# Patient Record
Sex: Male | Born: 1945 | Race: White | Hispanic: No | Marital: Married | Smoking: Former smoker
Health system: Southern US, Community
[De-identification: ages and names within clinical notes are randomized; demographics above are authoritative.]

## PROBLEM LIST (undated history)

## (undated) DIAGNOSIS — I82409 Acute embolism and thrombosis of unspecified deep veins of unspecified lower extremity: Secondary | ICD-10-CM

## (undated) DIAGNOSIS — G43909 Migraine, unspecified, not intractable, without status migrainosus: Secondary | ICD-10-CM

## (undated) DIAGNOSIS — I517 Cardiomegaly: Secondary | ICD-10-CM

## (undated) DIAGNOSIS — M1990 Unspecified osteoarthritis, unspecified site: Secondary | ICD-10-CM

## (undated) DIAGNOSIS — E669 Obesity, unspecified: Secondary | ICD-10-CM

## (undated) DIAGNOSIS — N400 Enlarged prostate without lower urinary tract symptoms: Secondary | ICD-10-CM

## (undated) DIAGNOSIS — E079 Disorder of thyroid, unspecified: Secondary | ICD-10-CM

## (undated) DIAGNOSIS — L0390 Cellulitis, unspecified: Secondary | ICD-10-CM

## (undated) DIAGNOSIS — I639 Cerebral infarction, unspecified: Secondary | ICD-10-CM

## (undated) DIAGNOSIS — G459 Transient cerebral ischemic attack, unspecified: Secondary | ICD-10-CM

## (undated) DIAGNOSIS — I10 Essential (primary) hypertension: Secondary | ICD-10-CM

## (undated) DIAGNOSIS — E785 Hyperlipidemia, unspecified: Secondary | ICD-10-CM

## (undated) DIAGNOSIS — I5189 Other ill-defined heart diseases: Secondary | ICD-10-CM

## (undated) HISTORY — PX: HERNIA REPAIR: SHX51

## (undated) HISTORY — DX: Unspecified osteoarthritis, unspecified site: M19.90

## (undated) HISTORY — DX: Hyperlipidemia, unspecified: E78.5

## (undated) HISTORY — DX: Other ill-defined heart diseases: I51.89

## (undated) HISTORY — DX: Obesity, unspecified: E66.9

## (undated) HISTORY — DX: Essential (primary) hypertension: I10

## (undated) HISTORY — DX: Migraine, unspecified, not intractable, without status migrainosus: G43.909

## (undated) HISTORY — DX: Cardiomegaly: I51.7

## (undated) HISTORY — PX: TONSILLECTOMY: SUR1361

## (undated) HISTORY — PX: HAND SURGERY: SHX662

## (undated) HISTORY — DX: Acute embolism and thrombosis of unspecified deep veins of unspecified lower extremity: I82.409

## (undated) HISTORY — DX: Cerebral infarction, unspecified: I63.9

## (undated) HISTORY — DX: Cellulitis, unspecified: L03.90

## (undated) HISTORY — DX: Transient cerebral ischemic attack, unspecified: G45.9

## (undated) HISTORY — DX: Disorder of thyroid, unspecified: E07.9

---

## 1997-09-28 ENCOUNTER — Other Ambulatory Visit: Admission: RE | Admit: 1997-09-28 | Discharge: 1997-09-28 | Payer: Self-pay | Admitting: Gastroenterology

## 2002-01-24 ENCOUNTER — Encounter: Payer: Self-pay | Admitting: Family Medicine

## 2002-01-24 ENCOUNTER — Inpatient Hospital Stay (HOSPITAL_COMMUNITY): Admission: AD | Admit: 2002-01-24 | Discharge: 2002-01-27 | Payer: Self-pay | Admitting: Family Medicine

## 2002-01-26 ENCOUNTER — Encounter: Payer: Self-pay | Admitting: Family Medicine

## 2007-06-14 ENCOUNTER — Encounter: Admission: RE | Admit: 2007-06-14 | Discharge: 2007-06-14 | Payer: Self-pay | Admitting: Internal Medicine

## 2007-07-15 ENCOUNTER — Encounter: Admission: RE | Admit: 2007-07-15 | Discharge: 2007-07-15 | Payer: Self-pay | Admitting: Internal Medicine

## 2007-07-18 ENCOUNTER — Encounter: Admission: RE | Admit: 2007-07-18 | Discharge: 2007-07-18 | Payer: Self-pay | Admitting: Internal Medicine

## 2008-12-19 ENCOUNTER — Observation Stay (HOSPITAL_COMMUNITY): Admission: EM | Admit: 2008-12-19 | Discharge: 2008-12-20 | Payer: Self-pay | Admitting: Emergency Medicine

## 2008-12-19 ENCOUNTER — Encounter (INDEPENDENT_AMBULATORY_CARE_PROVIDER_SITE_OTHER): Payer: Self-pay | Admitting: Internal Medicine

## 2008-12-21 ENCOUNTER — Encounter: Admission: RE | Admit: 2008-12-21 | Discharge: 2008-12-21 | Payer: Self-pay | Admitting: Internal Medicine

## 2008-12-22 ENCOUNTER — Ambulatory Visit: Payer: Self-pay | Admitting: Vascular Surgery

## 2008-12-29 ENCOUNTER — Encounter (HOSPITAL_COMMUNITY): Admission: RE | Admit: 2008-12-29 | Discharge: 2009-01-29 | Payer: Self-pay | Admitting: Cardiology

## 2009-08-17 ENCOUNTER — Encounter (INDEPENDENT_AMBULATORY_CARE_PROVIDER_SITE_OTHER): Payer: Self-pay | Admitting: *Deleted

## 2009-08-18 ENCOUNTER — Encounter (INDEPENDENT_AMBULATORY_CARE_PROVIDER_SITE_OTHER): Payer: Self-pay | Admitting: *Deleted

## 2009-08-19 ENCOUNTER — Ambulatory Visit: Payer: Self-pay | Admitting: Gastroenterology

## 2009-09-02 ENCOUNTER — Ambulatory Visit: Payer: Self-pay | Admitting: Gastroenterology

## 2010-01-19 ENCOUNTER — Ambulatory Visit: Payer: Self-pay | Admitting: Vascular Surgery

## 2010-02-11 ENCOUNTER — Telehealth: Payer: Self-pay | Admitting: Gastroenterology

## 2010-03-01 NOTE — Miscellaneous (Signed)
Summary: [facility] previsit  Clinical Lists Changes  Medications: Added new medication of MOVIPREP 100 GM  SOLR (PEG-KCL-NACL-NASULF-NA ASC-C) As per prep instructions. - Signed Rx of MOVIPREP 100 GM  SOLR (PEG-KCL-NACL-NASULF-NA ASC-C) As per prep instructions.;  #1 x 0;  Signed;  Entered by: Randall Hiss RN;  Authorized by: Inda Castle MD;  Method used: Electronically to [facility]*, [address], [place], [place]  [id number], Ph: [phone], Fax: [phone] Allergies: Added new allergy or adverse reaction of * ANYTHING OF A BOVINE NATURE Observations: Added new observation of NKA: F (08/19/2009 15:15)    Prescriptions: MOVIPREP 100 GM  SOLR (PEG-KCL-NACL-NASULF-NA ASC-C) As per prep instructions.  #1 x 0   Entered by:   Randall Hiss RN   Authorized by:   Inda Castle MD   Signed by:   Randall Hiss RN on 08/19/2009   Method used:   Electronically to        [facility] (retail)       [place], [place]  [id number]       Ph: [phone]       Fax: [phone]   RxID:   [id number]

## 2010-03-01 NOTE — Letter (Signed)
Summary: Previsit letter  [facility]  [place], [place] [address]   Phone: [phone]  Fax: [phone]       08/17/2009 MRN: [id number]  Casey Parker [place], [place]  [address]  Dear Mr. Besser,  Welcome to the Gastroenterology Division at [facility].    You are scheduled to see a nurse for your pre-procedure visit on 08/19/2009 at 3:30pm on the 3rd floor at [facility], [place] [employer].  We ask that you try to arrive at our office 15 minutes prior to your appointment time to allow for check-in.  Your nurse visit will consist of discussing your medical and surgical history, your immediate family medical history, and your medications.    Please bring a complete list of all your medications or, if you prefer, bring the medication bottles and we will list them.  We will need to be aware of both prescribed and over the counter drugs.  We will need to know exact dosage information as well.  If you are on blood thinners (Coumadin, Plavix, Aggrenox, Ticlid, etc.) please call our office today/prior to your appointment, as we need to consult with your physician about holding your medication.   Please be prepared to read and sign documents such as consent forms, a financial agreement, and acknowledgement forms.  If necessary, and with your consent, a friend or relative is welcome to sit-in on the nurse visit with you.  Please bring your insurance card so that we may make a copy of it.  If your insurance requires a referral to see a specialist, please bring your referral form from your primary care physician.  No co-pay is required for this nurse visit.     If you cannot keep your appointment, please call [phone] to cancel or reschedule prior to your appointment date.  This allows [place] the opportunity to schedule an appointment for another patient in need of care.    Thank you for choosing [facility] for your medical needs.  We  appreciate the opportunity to care for you.  Please visit [place] at our website  to learn more about our practice.                     Sincerely.                                                                                                                   The Gastroenterology Division

## 2010-03-01 NOTE — Letter (Signed)
Summary: [facility] Instructions  [facility]  [place], [place] [address]   Phone: [phone]  Fax: [phone]       Casey Parker    [birth year]    MRN: [id number]        Procedure Day /Date: Thursday 09/02/2009     Arrival Time:  1:00 pm      Procedure Time: 2:00 pm     Location of Procedure:                    _x _  [place] (4th Floor)                        [place]   Starting 5 days prior to your procedure Saturday 7/30 do not eat nuts, seeds, popcorn, corn, beans, peas,  salads, or any raw vegetables.  Do not take any fiber supplements (e.g. Metamucil, Citrucel, and Benefiber).  THE DAY BEFORE YOUR PROCEDURE         DATE: Wednesday 8/3  1.  Drink clear liquids the entire day-NO SOLID FOOD  2.  Do not drink anything colored red or purple.  Avoid juices with pulp.  No orange juice.  3.  Drink at least 64 oz. (8 glasses) of fluid/clear liquids during the day to prevent dehydration and help the prep work efficiently.  CLEAR LIQUIDS INCLUDE: Water Jello Ice Popsicles Tea (sugar ok, no milk/cream) Powdered fruit flavored drinks Coffee (sugar ok, no milk/cream) Gatorade Juice: apple, white grape, white cranberry  Lemonade Clear bullion, consomm, broth Carbonated beverages (any kind) Strained chicken noodle soup Hard Candy                             4.  In the morning, mix first dose of MoviPrep solution:    Empty 1 Pouch A and 1 Pouch B into the disposable container    Add lukewarm drinking water to the top line of the container. Mix to dissolve    Refrigerate (mixed solution should be used within 24 hrs)  5.  Begin drinking the prep at 5:00 p.m. The MoviPrep container is divided by 4 marks.   Every 15 minutes drink the solution down to the next mark (approximately 8 oz) until the full liter is complete.   6.  Follow completed prep with 16 oz of clear liquid of your choice (Nothing  red or purple).  Continue to drink clear liquids until bedtime.  7.  Before going to bed, mix second dose of MoviPrep solution:    Empty 1 Pouch A and 1 Pouch B into the disposable container    Add lukewarm drinking water to the top line of the container. Mix to dissolve    Refrigerate  THE DAY OF YOUR PROCEDURE      DATE: Thursday 8/4  Beginning at 9:00 a.m. (5 hours before procedure):         1. Every 15 minutes, drink the solution down to the next mark (approx 8 oz) until the full liter is complete.  2. Follow completed prep with 16 oz. of clear liquid of your choice.    3. You may drink clear liquids until 12:00 pm (2 HOURS BEFORE PROCEDURE).   MEDICATION INSTRUCTIONS  Unless otherwise instructed, you should take regular prescription medications with a small sip of water   as early as possible the morning of  your procedure.        OTHER INSTRUCTIONS  You will need a responsible adult at least 65 years of age to accompany you and drive you home.   This person must remain in the waiting room during your procedure.  Wear loose fitting clothing that is easily removed.  Leave jewelry and other valuables at home.  However, you may wish to bring a book to read or  an iPod/MP3 player to listen to music as you wait for your procedure to start.  Remove all body piercing jewelry and leave at home.  Total time from sign-in until discharge is approximately 2-3 hours.  You should go home directly after your procedure and rest.  You can resume normal activities the  day after your procedure.  The day of your procedure you should not:   Drive   Make legal decisions   Operate machinery   Drink alcohol   Return to work  You will receive specific instructions about eating, activities and medications before you leave.    The above instructions have been reviewed and explained to me by   Randall Hiss RN  August 19, 2009 3:49 PM    I fully understand and can  verbalize these instructions _____________________________ Date _________

## 2010-03-01 NOTE — Procedures (Signed)
Summary: Colonoscopy  Patient: [name] Note: All result statuses are Final unless otherwise noted.  Tests: (1) Colonoscopy (COL)   COL Colonoscopy           [facility]     [place], [place]  [address]           OPERATIVE PROCEDURE REPORT           PATIENT:  Casey Parker, Casey Parker  MR#:  [id number]     BIRTHDATE:  March 03, 1945, 64 yrs. old  GENDER:  male           ENDOSCOPIST:  Sandy Salaam. Deatra Ina, MD     ASSISTANT:           PROCEDURE DATE:  09/02/2009     PROCEDURE:  Diagnostic Colonoscopy     ASA CLASS:  Class II     INDICATIONS:  screening, history of pre-cancerous (adenomatous)     colon polyps Last colonoscopy 2007           MEDICATIONS:   Fentanyl 75 mcg IV, Versed 9 mg IV           DESCRIPTION OF PROCEDURE:   After the risks benefits and     alternatives of the procedure were thoroughly explained, informed     consent was obtained.  Digital rectal exam was performed and     revealed no abnormalities.   The LB CF-[id number] endoscope     was introduced through the anus and advanced to the cecum, which     was identified by both the appendix and ileocecal valve, without     limitations.  The quality of the prep was excellent, using     MoviPrep.  The instrument was then slowly withdrawn as the colon     was fully examined.  Time to cecum: 5.25.  Withdraw time 6.0           <<PROCEDUREIMAGES>>           FINDINGS:  A normal appearing cecum, ileocecal valve, and     appendiceal orifice were identified. The ascending, hepatic     flexure, transverse, splenic flexure, descending, sigmoid colon,     and rectum appeared unremarkable (see image2, image3, image4,     image6, image7, image8, image10, image12, and image13).     Retroflexed views in the rectum revealed no abnormalities.    The     scope was then withdrawn from the patient and the procedure     terminated.           COMPLICATIONS:  None           ENDOSCOPIC IMPRESSION:     1)  Normal colon     RECOMMENDATIONS:     1) Colonoscopy in 7 years           REPEAT EXAM:  In 7 year(s) for.           ______________________________     Sandy Salaam. Deatra Ina, MD           CC:  Casey Parker, M.D.           n.     eSIGNED:   Sandy Salaam. Clarice Bonaventure at 09/02/2009 02:05 PM           Konrad Penta, [id number]  Note: An exclamation mark (!) indicates a result that was not dispersed into the flowsheet. Document Creation Date: 09/02/2009 2:07 PM _______________________________________________________________________  Marland Kitchen  1) Order result status: Final Collection or observation date-time: 09/02/2009 14:01 Requested date-time:  Receipt date-time:  Reported date-time:  Referring Physician:   Ordering Physician: Erskine Emery [phone]) Specimen Source:  Source: Casey Parker Order Number: [id number] Lab site:   Appended Document: Colonoscopy    Clinical Lists Changes  Observations: Added new observation of COLONNXTDUE: 08/2016 (09/02/2009 15:56)

## 2010-03-03 NOTE — Progress Notes (Signed)
Summary: Procedure coding  Phone Note Call from Patient Call back at [phone]   Caller: Patient Call For: Dr. Deatra Ina Reason for Call: Talk to Nurse Summary of Call: Pt is calling because he is saying that we coded his procedure wrong and that we need to recode it to make it preventative,  Casey Parker From  [employer] ([phone]) wants to speak with someone about this matter and the coding guidelines from [facility], they have already spoke to the billing office which reffered them to Dr. Deatra Ina directly because that is who has to make the decision to change the coding the patient has given verbal permission for [place] to speak with Casey about this issue Initial call taken by: Martinique Johnson,  February 11, 2010 4:35 PM  Follow-up for Phone Call        We need to code the procedure: V76.51, V12.72 Follow-up by: Inda Castle MD,  February 14, 2010 4:16 PM  Additional Follow-up for Phone Call Additional follow up Details #1::        Information sent to [employer] with [facility] Billing.  Additional Follow-up by: Cora Daniels,  February 15, 2010 7:21 AM

## 2010-05-04 LAB — POCT CARDIAC MARKERS
CKMB, poc: 2 ng/mL (ref 1.0–8.0)
Myoglobin, poc: 73.4 ng/mL (ref 12–200)
Myoglobin, poc: 95.2 ng/mL (ref 12–200)
Troponin i, poc: <0.05 ng/mL (ref 0.00–0.09)

## 2010-05-04 LAB — COMPREHENSIVE METABOLIC PANEL
AST: 18 U/L (ref 0–37)
CO2: 29 mEq/L (ref 19–32)
Calcium: 8.9 mg/dL (ref 8.4–10.5)
Creatinine, Ser: 0.88 mg/dL (ref 0.4–1.5)
GFR calc Af Amer: >60 mL/min (ref >60)
GFR calc non Af Amer: >60 mL/min (ref >60)
Total Protein: 6.3 g/dL (ref 6.0–8.3)

## 2010-05-04 LAB — URINALYSIS, ROUTINE W REFLEX MICROSCOPIC
Bilirubin Urine: NEGATIVE
Glucose, UA: NEGATIVE mg/dL
Hgb urine dipstick: NEGATIVE
Ketones, ur: NEGATIVE mg/dL
Protein, ur: NEGATIVE mg/dL
pH: 6 (ref 5.0–8.0)

## 2010-05-04 LAB — DIFFERENTIAL
Basophils Absolute: 0 10*3/uL (ref 0.0–0.1)
Basophils Relative: 0 % (ref 0–1)
Eosinophils Relative: 1 % (ref 0–5)
Monocytes Absolute: 0.6 10*3/uL (ref 0.1–1.0)

## 2010-05-04 LAB — CBC
HCT: 36.9 % — ABNORMAL LOW (ref 39.0–52.0)
Hemoglobin: 12.8 g/dL — ABNORMAL LOW (ref 13.0–17.0)
MCHC: 34.5 g/dL (ref 30.0–36.0)
MCHC: 34.7 g/dL (ref 30.0–36.0)
MCV: 95.1 fL (ref 78.0–100.0)
Platelets: 183 10*3/uL (ref 150–400)
RBC: 3.98 MIL/uL — ABNORMAL LOW (ref 4.22–5.81)
RDW: 13.2 % (ref 11.5–15.5)
RDW: 13.4 % (ref 11.5–15.5)

## 2010-05-04 LAB — BASIC METABOLIC PANEL
BUN: 17 mg/dL (ref 6–23)
CO2: 28 mEq/L (ref 19–32)
Calcium: 8.6 mg/dL (ref 8.4–10.5)
Glucose, Bld: 115 mg/dL — ABNORMAL HIGH (ref 70–99)
Potassium: 3.8 mEq/L (ref 3.5–5.1)
Sodium: 137 mEq/L (ref 135–145)

## 2010-05-04 LAB — LIPID PANEL
Cholesterol: 243 mg/dL — ABNORMAL HIGH (ref 0–200)
LDL Cholesterol: 177 mg/dL — ABNORMAL HIGH (ref 0–99)
Total CHOL/HDL Ratio: 5.9 RATIO

## 2010-05-04 LAB — T4, FREE: Free T4: 1.23 ng/dL (ref 0.80–1.80)

## 2010-05-04 LAB — PROTIME-INR: Prothrombin Time: 12.5 seconds (ref 11.6–15.2)

## 2010-06-14 NOTE — Procedures (Signed)
CAROTID DUPLEX EXAM   INDICATION:  History of TIAs.   HISTORY:  Diabetes:  No.  Cardiac:  No.  Hypertension:  Yes.  Smoking:  Previous.  Previous Surgery:  No.  CV History:  TIA.  Amaurosis Fugax No, Paresthesias No, Hemiparesis No                                       RIGHT             LEFT  Brachial systolic pressure:         125               122  Brachial Doppler waveforms:         Triphasic         Triphasic  Vertebral direction of flow:        Antegrade         Antegrade  DUPLEX VELOCITIES (cm/sec)  CCA peak systolic                   66                72  ECA peak systolic                   87                95  ICA peak systolic                   111               79  ICA end diastolic                   50                29  PLAQUE MORPHOLOGY:                  Calcified         Calcified  PLAQUE AMOUNT:                      Moderate          Mild  PLAQUE LOCATION:                    Bulb and ICA      Bulb and ECA   IMPRESSION:  40%-59% stenosis noted in the right internal carotid  artery.   ___________________________________________  Judeth Cornfield. Scot Dock, M.D.   CJ/MEDQ  D:  12/23/2008  T:  12/23/2008  Job:  [id number]

## 2010-06-14 NOTE — Procedures (Signed)
CAROTID DUPLEX EXAM   INDICATION:  Follow up carotid artery disease.   HISTORY:  Diabetes:  No.  Cardiac:  No.  Hypertension:  Yes.  Smoking:  Previous.  Previous Surgery:  No.  CV History:  Patient is currently asymptomatic.  Amaurosis Fugax No, Paresthesias No, Hemiparesis No.                                       RIGHT             LEFT  Brachial systolic pressure:         124               128  Brachial Doppler waveforms:         WNL               WNL  Vertebral direction of flow:        Antegrade         Antegrade  DUPLEX VELOCITIES (cm/sec)  CCA peak systolic                   53                73  ECA peak systolic                   65                52  ICA peak systolic                   70                54  ICA end diastolic                   30                21  PLAQUE MORPHOLOGY:                  Heterogenous      Heterogenous  PLAQUE AMOUNT:                      Mild              Mild  PLAQUE LOCATION:                    ICA, ECA          ICA, ECA   IMPRESSION:  1. Bilaterally no hemodynamically significant stenosis.  2. Bilateral intimal thickening within the common carotid arteries.  3. Study is stable compared to previous.   ___________________________________________  Judeth Cornfield. Scot Dock, M.D.   OD/MEDQ  D:  02/14/2010  T:  02/14/2010  Job:  [id number]
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
## 2011-04-05 DIAGNOSIS — Z125 Encounter for screening for malignant neoplasm of prostate: Secondary | ICD-10-CM | POA: Diagnosis not present

## 2011-04-05 DIAGNOSIS — E039 Hypothyroidism, unspecified: Secondary | ICD-10-CM | POA: Diagnosis not present

## 2011-04-05 DIAGNOSIS — I10 Essential (primary) hypertension: Secondary | ICD-10-CM | POA: Diagnosis not present

## 2011-04-05 DIAGNOSIS — E785 Hyperlipidemia, unspecified: Secondary | ICD-10-CM | POA: Diagnosis not present

## 2011-04-12 DIAGNOSIS — Z125 Encounter for screening for malignant neoplasm of prostate: Secondary | ICD-10-CM | POA: Diagnosis not present

## 2011-04-12 DIAGNOSIS — I6350 Cerebral infarction due to unspecified occlusion or stenosis of unspecified cerebral artery: Secondary | ICD-10-CM | POA: Diagnosis not present

## 2011-04-12 DIAGNOSIS — G4733 Obstructive sleep apnea (adult) (pediatric): Secondary | ICD-10-CM | POA: Diagnosis not present

## 2011-04-12 DIAGNOSIS — Z0000 Encounter for general adult medical examination without abnormal findings: Secondary | ICD-10-CM | POA: Diagnosis not present

## 2011-04-13 DIAGNOSIS — Z1212 Encounter for screening for malignant neoplasm of rectum: Secondary | ICD-10-CM | POA: Diagnosis not present

## 2011-06-15 ENCOUNTER — Encounter: Payer: Self-pay | Admitting: *Deleted

## 2011-06-15 DIAGNOSIS — I5189 Other ill-defined heart diseases: Secondary | ICD-10-CM | POA: Insufficient documentation

## 2011-06-15 DIAGNOSIS — E669 Obesity, unspecified: Secondary | ICD-10-CM | POA: Insufficient documentation

## 2011-06-15 DIAGNOSIS — I517 Cardiomegaly: Secondary | ICD-10-CM | POA: Insufficient documentation

## 2011-06-15 DIAGNOSIS — L0390 Cellulitis, unspecified: Secondary | ICD-10-CM | POA: Insufficient documentation

## 2011-06-15 DIAGNOSIS — G459 Transient cerebral ischemic attack, unspecified: Secondary | ICD-10-CM | POA: Insufficient documentation

## 2011-06-15 DIAGNOSIS — G43909 Migraine, unspecified, not intractable, without status migrainosus: Secondary | ICD-10-CM | POA: Insufficient documentation

## 2011-06-15 DIAGNOSIS — M1990 Unspecified osteoarthritis, unspecified site: Secondary | ICD-10-CM | POA: Insufficient documentation

## 2011-10-10 DIAGNOSIS — Z23 Encounter for immunization: Secondary | ICD-10-CM | POA: Diagnosis not present

## 2011-10-10 DIAGNOSIS — I10 Essential (primary) hypertension: Secondary | ICD-10-CM | POA: Diagnosis not present

## 2011-10-10 DIAGNOSIS — E039 Hypothyroidism, unspecified: Secondary | ICD-10-CM | POA: Diagnosis not present

## 2011-10-10 DIAGNOSIS — E785 Hyperlipidemia, unspecified: Secondary | ICD-10-CM | POA: Diagnosis not present

## 2012-04-10 DIAGNOSIS — Z125 Encounter for screening for malignant neoplasm of prostate: Secondary | ICD-10-CM | POA: Diagnosis not present

## 2012-04-10 DIAGNOSIS — E785 Hyperlipidemia, unspecified: Secondary | ICD-10-CM | POA: Diagnosis not present

## 2012-04-10 DIAGNOSIS — E039 Hypothyroidism, unspecified: Secondary | ICD-10-CM | POA: Diagnosis not present

## 2012-04-10 DIAGNOSIS — I10 Essential (primary) hypertension: Secondary | ICD-10-CM | POA: Diagnosis not present

## 2012-04-18 DIAGNOSIS — Z125 Encounter for screening for malignant neoplasm of prostate: Secondary | ICD-10-CM | POA: Diagnosis not present

## 2012-04-18 DIAGNOSIS — G4733 Obstructive sleep apnea (adult) (pediatric): Secondary | ICD-10-CM | POA: Diagnosis not present

## 2012-04-18 DIAGNOSIS — E039 Hypothyroidism, unspecified: Secondary | ICD-10-CM | POA: Diagnosis not present

## 2012-04-18 DIAGNOSIS — Z0000 Encounter for general adult medical examination without abnormal findings: Secondary | ICD-10-CM | POA: Diagnosis not present

## 2012-04-18 DIAGNOSIS — I10 Essential (primary) hypertension: Secondary | ICD-10-CM | POA: Diagnosis not present

## 2012-04-18 DIAGNOSIS — I6350 Cerebral infarction due to unspecified occlusion or stenosis of unspecified cerebral artery: Secondary | ICD-10-CM | POA: Diagnosis not present

## 2012-04-18 DIAGNOSIS — E785 Hyperlipidemia, unspecified: Secondary | ICD-10-CM | POA: Diagnosis not present

## 2012-04-18 DIAGNOSIS — L259 Unspecified contact dermatitis, unspecified cause: Secondary | ICD-10-CM | POA: Diagnosis not present

## 2012-04-18 DIAGNOSIS — Z23 Encounter for immunization: Secondary | ICD-10-CM | POA: Diagnosis not present

## 2012-04-18 DIAGNOSIS — L0291 Cutaneous abscess, unspecified: Secondary | ICD-10-CM | POA: Diagnosis not present

## 2012-04-23 DIAGNOSIS — Z1212 Encounter for screening for malignant neoplasm of rectum: Secondary | ICD-10-CM | POA: Diagnosis not present

## 2012-10-14 DIAGNOSIS — E039 Hypothyroidism, unspecified: Secondary | ICD-10-CM | POA: Diagnosis not present

## 2012-10-14 DIAGNOSIS — G4733 Obstructive sleep apnea (adult) (pediatric): Secondary | ICD-10-CM | POA: Diagnosis not present

## 2012-10-14 DIAGNOSIS — I10 Essential (primary) hypertension: Secondary | ICD-10-CM | POA: Diagnosis not present

## 2012-10-14 DIAGNOSIS — Z23 Encounter for immunization: Secondary | ICD-10-CM | POA: Diagnosis not present

## 2012-10-14 DIAGNOSIS — I6350 Cerebral infarction due to unspecified occlusion or stenosis of unspecified cerebral artery: Secondary | ICD-10-CM | POA: Diagnosis not present

## 2012-10-14 DIAGNOSIS — E785 Hyperlipidemia, unspecified: Secondary | ICD-10-CM | POA: Diagnosis not present

## 2012-10-14 DIAGNOSIS — Z1331 Encounter for screening for depression: Secondary | ICD-10-CM | POA: Diagnosis not present

## 2012-10-14 DIAGNOSIS — L0291 Cutaneous abscess, unspecified: Secondary | ICD-10-CM | POA: Diagnosis not present

## 2012-10-14 DIAGNOSIS — E669 Obesity, unspecified: Secondary | ICD-10-CM | POA: Diagnosis not present

## 2013-04-15 DIAGNOSIS — Z79899 Other long term (current) drug therapy: Secondary | ICD-10-CM | POA: Diagnosis not present

## 2013-04-15 DIAGNOSIS — E785 Hyperlipidemia, unspecified: Secondary | ICD-10-CM | POA: Diagnosis not present

## 2013-04-15 DIAGNOSIS — E039 Hypothyroidism, unspecified: Secondary | ICD-10-CM | POA: Diagnosis not present

## 2013-04-15 DIAGNOSIS — Z125 Encounter for screening for malignant neoplasm of prostate: Secondary | ICD-10-CM | POA: Diagnosis not present

## 2013-04-15 DIAGNOSIS — I10 Essential (primary) hypertension: Secondary | ICD-10-CM | POA: Diagnosis not present

## 2013-04-22 DIAGNOSIS — M79609 Pain in unspecified limb: Secondary | ICD-10-CM | POA: Diagnosis not present

## 2013-04-22 DIAGNOSIS — E785 Hyperlipidemia, unspecified: Secondary | ICD-10-CM | POA: Diagnosis not present

## 2013-04-22 DIAGNOSIS — E039 Hypothyroidism, unspecified: Secondary | ICD-10-CM | POA: Diagnosis not present

## 2013-04-22 DIAGNOSIS — R351 Nocturia: Secondary | ICD-10-CM | POA: Diagnosis not present

## 2013-04-22 DIAGNOSIS — I10 Essential (primary) hypertension: Secondary | ICD-10-CM | POA: Diagnosis not present

## 2013-04-22 DIAGNOSIS — I6529 Occlusion and stenosis of unspecified carotid artery: Secondary | ICD-10-CM | POA: Diagnosis not present

## 2013-04-22 DIAGNOSIS — Z0000 Encounter for general adult medical examination without abnormal findings: Secondary | ICD-10-CM | POA: Diagnosis not present

## 2013-04-22 DIAGNOSIS — I6990 Unspecified sequelae of unspecified cerebrovascular disease: Secondary | ICD-10-CM | POA: Diagnosis not present

## 2013-04-22 DIAGNOSIS — Z125 Encounter for screening for malignant neoplasm of prostate: Secondary | ICD-10-CM | POA: Diagnosis not present

## 2013-04-23 DIAGNOSIS — Z1212 Encounter for screening for malignant neoplasm of rectum: Secondary | ICD-10-CM | POA: Diagnosis not present

## 2013-10-16 DIAGNOSIS — E039 Hypothyroidism, unspecified: Secondary | ICD-10-CM | POA: Diagnosis not present

## 2013-10-16 DIAGNOSIS — I872 Venous insufficiency (chronic) (peripheral): Secondary | ICD-10-CM | POA: Diagnosis not present

## 2013-10-16 DIAGNOSIS — I10 Essential (primary) hypertension: Secondary | ICD-10-CM | POA: Diagnosis not present

## 2013-10-16 DIAGNOSIS — M1610 Unilateral primary osteoarthritis, unspecified hip: Secondary | ICD-10-CM | POA: Diagnosis not present

## 2013-10-16 DIAGNOSIS — M1990 Unspecified osteoarthritis, unspecified site: Secondary | ICD-10-CM | POA: Diagnosis not present

## 2013-10-16 DIAGNOSIS — I6529 Occlusion and stenosis of unspecified carotid artery: Secondary | ICD-10-CM | POA: Diagnosis not present

## 2013-10-16 DIAGNOSIS — E785 Hyperlipidemia, unspecified: Secondary | ICD-10-CM | POA: Diagnosis not present

## 2013-10-16 DIAGNOSIS — M169 Osteoarthritis of hip, unspecified: Secondary | ICD-10-CM | POA: Diagnosis not present

## 2013-10-16 DIAGNOSIS — M5137 Other intervertebral disc degeneration, lumbosacral region: Secondary | ICD-10-CM | POA: Diagnosis not present

## 2013-10-16 DIAGNOSIS — Z23 Encounter for immunization: Secondary | ICD-10-CM | POA: Diagnosis not present

## 2013-10-16 DIAGNOSIS — Z1331 Encounter for screening for depression: Secondary | ICD-10-CM | POA: Diagnosis not present

## 2013-10-16 DIAGNOSIS — M4310 Spondylolisthesis, site unspecified: Secondary | ICD-10-CM | POA: Diagnosis not present

## 2013-10-16 DIAGNOSIS — G4733 Obstructive sleep apnea (adult) (pediatric): Secondary | ICD-10-CM | POA: Diagnosis not present

## 2013-10-16 DIAGNOSIS — R609 Edema, unspecified: Secondary | ICD-10-CM | POA: Diagnosis not present

## 2013-10-31 DIAGNOSIS — M47817 Spondylosis without myelopathy or radiculopathy, lumbosacral region: Secondary | ICD-10-CM | POA: Diagnosis not present

## 2013-10-31 DIAGNOSIS — M1611 Unilateral primary osteoarthritis, right hip: Secondary | ICD-10-CM | POA: Diagnosis not present

## 2013-10-31 DIAGNOSIS — M25551 Pain in right hip: Secondary | ICD-10-CM | POA: Diagnosis not present

## 2013-10-31 DIAGNOSIS — Q762 Congenital spondylolisthesis: Secondary | ICD-10-CM | POA: Diagnosis not present

## 2013-11-11 DIAGNOSIS — M25551 Pain in right hip: Secondary | ICD-10-CM | POA: Diagnosis not present

## 2013-11-11 DIAGNOSIS — M1611 Unilateral primary osteoarthritis, right hip: Secondary | ICD-10-CM | POA: Diagnosis not present

## 2013-12-08 DIAGNOSIS — Q762 Congenital spondylolisthesis: Secondary | ICD-10-CM | POA: Diagnosis not present

## 2013-12-08 DIAGNOSIS — M4310 Spondylolisthesis, site unspecified: Secondary | ICD-10-CM | POA: Diagnosis not present

## 2013-12-08 DIAGNOSIS — M545 Low back pain: Secondary | ICD-10-CM | POA: Diagnosis not present

## 2014-02-25 DIAGNOSIS — M25551 Pain in right hip: Secondary | ICD-10-CM | POA: Diagnosis not present

## 2014-02-25 DIAGNOSIS — M1611 Unilateral primary osteoarthritis, right hip: Secondary | ICD-10-CM | POA: Diagnosis not present

## 2014-04-09 ENCOUNTER — Encounter (HOSPITAL_COMMUNITY): Payer: Self-pay

## 2014-04-09 ENCOUNTER — Emergency Department (HOSPITAL_COMMUNITY)
Admission: EM | Admit: 2014-04-09 | Discharge: 2014-04-09 | Disposition: A | Discharge status: Home / Self Care | Payer: Medicare Other | Attending: Emergency Medicine | Admitting: Emergency Medicine

## 2014-04-09 DIAGNOSIS — Z720 Tobacco use: Secondary | ICD-10-CM | POA: Insufficient documentation

## 2014-04-09 DIAGNOSIS — I519 Heart disease, unspecified: Secondary | ICD-10-CM | POA: Insufficient documentation

## 2014-04-09 DIAGNOSIS — Z8673 Personal history of transient ischemic attack (TIA), and cerebral infarction without residual deficits: Secondary | ICD-10-CM | POA: Insufficient documentation

## 2014-04-09 DIAGNOSIS — I8391 Asymptomatic varicose veins of right lower extremity: Secondary | ICD-10-CM | POA: Diagnosis present

## 2014-04-09 DIAGNOSIS — Z880 Allergy status to penicillin: Secondary | ICD-10-CM | POA: Insufficient documentation

## 2014-04-09 DIAGNOSIS — I10 Essential (primary) hypertension: Secondary | ICD-10-CM | POA: Insufficient documentation

## 2014-04-09 DIAGNOSIS — E079 Disorder of thyroid, unspecified: Secondary | ICD-10-CM | POA: Diagnosis not present

## 2014-04-09 DIAGNOSIS — Z7982 Long term (current) use of aspirin: Secondary | ICD-10-CM | POA: Insufficient documentation

## 2014-04-09 DIAGNOSIS — Z79899 Other long term (current) drug therapy: Secondary | ICD-10-CM | POA: Insufficient documentation

## 2014-04-09 DIAGNOSIS — E785 Hyperlipidemia, unspecified: Secondary | ICD-10-CM | POA: Diagnosis not present

## 2014-04-09 DIAGNOSIS — I83891 Varicose veins of right lower extremities with other complications: Secondary | ICD-10-CM | POA: Diagnosis not present

## 2014-04-09 DIAGNOSIS — E669 Obesity, unspecified: Secondary | ICD-10-CM | POA: Insufficient documentation

## 2014-04-09 DIAGNOSIS — R58 Hemorrhage, not elsewhere classified: Secondary | ICD-10-CM | POA: Diagnosis not present

## 2014-04-09 DIAGNOSIS — M1990 Unspecified osteoarthritis, unspecified site: Secondary | ICD-10-CM | POA: Diagnosis not present

## 2014-04-09 DIAGNOSIS — Z792 Long term (current) use of antibiotics: Secondary | ICD-10-CM | POA: Diagnosis not present

## 2014-04-09 DIAGNOSIS — G43909 Migraine, unspecified, not intractable, without status migrainosus: Secondary | ICD-10-CM | POA: Diagnosis not present

## 2014-04-09 DIAGNOSIS — Z872 Personal history of diseases of the skin and subcutaneous tissue: Secondary | ICD-10-CM | POA: Insufficient documentation

## 2014-04-09 NOTE — Discharge Instructions (Signed)
Bleeding Varicose Veins Varicose veins are veins that have become enlarged and twisted. Valves in the veins help return blood from the leg to the heart. If these valves are damaged, blood flows backwards and backs up into the veins in the leg near the skin. This causes the veins to become larger because of increased pressure within. Sometimes these veins bleed. CAUSES  Factors that can lead to bleeding varicose veins include:  Thinning of the skin that covers the veins. This skin is stretched as the veins enlarge.  Weak and thinning walls of the varicose veins. These thin walls are part of the reason why blood is not flowing normally to the heart.  Having high pressure in the veins. This high pressure occurs because the blood is not flowing freely back up to the heart.  Injury. Even a small injury to a varicose vein can cause bleeding.  Open wounds. A sore may develop near a varicose vein and not heal. This makes bleeding more likely.  Taking medicine that thins the blood. These medicines may include aspirin, anti-inflammatory medicine, and other blood thinners. SYMPTOMS  If bleeding is on the outside surface of the skin, blood can be seen. Sometimes, the bleeding stays under the skin. If this happens, the blue or purple area will spread beyond the vein. This discoloration may be visible. DIAGNOSIS  To decide if you have a bleeding varicose vein, your caregiver may:  Ask about your symptoms. This will include when you first saw bleeding.  Ask about how long you have had varicose veins and if they cause you problems.  Ask about your overall health.  Ask about possible causes, like recent cuts or if the area near the varicose veins was bumped or injured.  Examine the skin or leg that concerns you. Your caregiver will probably feel the veins.  Order imaging tests. These create detailed pictures of the veins. TREATMENT  The first goal of treating bleeding varicose veins is to stop the  bleeding. Then, the aim is to keep any bleeding from happening again. Treatment will depend on the cause of the bleeding and how bad it is. Ask your caregiver about what would be best for you. Options include:  Raising (elevating) your leg. Lie down with your leg propped up on a pillow or cushion. Your foot should be above your heart.  Applying pressure to the spot that is bleeding. The bleeding should stop in a short time.  Wearing elastic stocking that "compress" your legs (compression stockings). An elastic bandage may do the same thing.  Applying an antibiotic cream on sores that are not healing.  Surgically removing or closing off the bleeding varicose veins. HOME CARE INSTRUCTIONS   Apply any creams that your caregiver prescribed. Follow the directions carefully.  Wear compression stockings or any special wraps that were prescribed. Make sure you know:  If you should wear them every day.  How long you should wear them.  If veins were removed or closed, a bandage (dressing) will probably cover the area. Make sure you know:  How often the dressing should be changed.  Whether the area can get wet.  When you can leave the skin uncovered.  Check your skin every day. Look for new sores and signs of bleeding.  To prevent future bleeding:  Use extra care in situations where you could cut your legs. Shaving, for example, or working outside in the garden.  Try to keep your legs elevated as much as possible. Lie down  when you can. SEEK MEDICAL CARE IF:   You have any questions about how to wear compression stockings or elastic bandages.  Your veins continue to bleed.  Sores develop near your varicose veins.  You have a sore that does not heal or gets bigger.  Pain increases in your leg.  The area around a varicose vein becomes warm, red, or tender to the touch.  You notice a yellowish fluid that smells bad coming from a spot where there was bleeding.  You develop a  fever of more than 100.5 F (38.1 C). SEEK IMMEDIATE MEDICAL CARE IF:   You develop a fever of more than 102 F (38.9 C). Document Released: 06/04/2008 Document Revised: 04/10/2011 Document Reviewed: 05/20/2013 [facility] Patient Information 2015 [place], [place]. This information is not intended to replace advice given to you by your health care provider. Make sure you discuss any questions you have with your health care provider.

## 2014-04-09 NOTE — ED Notes (Signed)
Pt complains of a bleeding varicose vein

## 2014-04-09 NOTE — ED Provider Notes (Signed)
CSN: [id number]     Arrival date & time 04/09/14  1923 History   First MD Initiated Contact with Patient 04/09/14 2007    This chart was scribed for non-physician practitioner, Charlann Lange, working with Debby Freiberg, MD by Terressa Koyanagi, ED Scribe. This patient was seen in room WTR5/WTR5 and the patient's care was started at 8:40 PM.  Chief Complaint  Patient presents with  . Varicose Veins   The history is provided by the patient. No language interpreter was used.   PCP: No primary care provider on file. HPI Comments: Casey Parker is a 69 y.o. male, with PMH noted below including varicose veins and occasional tobacco use, who presents to the Emergency Department complaining of bleeding varicose vein in the right ankle onset this evening. Pt states his vein "popped" this evening when he bumped into an object. Pt notes the same thing happened to a vein in his left ankle some time ago.   Past Medical History  Diagnosis Date  . Hyperlipidemia   . Obesity   . Hypertension   . Osteoarthritis   . Migraines     H/O  . Thyroid disease     hypo  . Cellulitis     recurrent  . Transient ischemic attack (TIA)     presumed  . Diastolic dysfunction   . Left ventricular hypertrophy    Past Surgical History  Procedure Laterality Date  . Hernia repair    . Hand surgery      right  . Other surgical history      tonsilectomy  . Adenomatous polypectomy      H/O   Family History  Problem Relation Age of Onset  . Hypertension Father    History  Substance Use Topics  . Smoking status: Current Some Day Smoker    Types: Pipe  . Smokeless tobacco: Not on file  . Alcohol Use: Not on file    Review of Systems  Constitutional: Negative for fever and chills.  Skin:        bleeding varicose vein in the right ankle  Psychiatric/Behavioral: Negative for confusion.   Allergies  Augmentin; Other; and Penicillins  Home Medications   Prior to Admission medications   Medication Sig  Start Date End Date Taking? Authorizing Provider  aspirin 325 MG tablet Take 325 mg by mouth daily.   Yes Historical Provider, MD  levothyroxine (SYNTHROID, LEVOTHROID) 125 MCG tablet Take 125 mcg by mouth daily.   Yes Historical Provider, MD  losartan (COZAAR) 100 MG tablet Take 100 mg by mouth daily.   Yes Historical Provider, MD  Multiple Vitamin (MULTIVITAMIN) tablet Take 1 tablet by mouth daily.   Yes Historical Provider, MD  Omega-3 Fatty Acids (FISH OIL PO) Take 1,600 Units by mouth daily.   Yes Historical Provider, MD  psyllium (METAMUCIL) 58.6 % powder Take 1 packet by mouth daily.   Yes Historical Provider, MD  simvastatin (ZOCOR) 20 MG tablet Take 20 mg by mouth daily.    Yes Historical Provider, MD  Sulfamethoxazole-Trimethoprim (SULFAMETHOXAZOLE-TMP DS PO) Take 1 tablet by mouth daily.   Yes Historical Provider, MD   Triage Vitals: BP 123/61 mmHg  Pulse 97  Temp(Src) 98.2 F (36.8 C) (Oral)  Resp 16  SpO2 97% Physical Exam  Constitutional: He is oriented to person, place, and time. He appears well-developed and well-nourished. No distress.  HENT:  Head: Normocephalic and atraumatic.  Eyes: Conjunctivae and EOM are normal.  Neck: Neck supple. No tracheal deviation  present.  Cardiovascular: Normal rate.   Pulmonary/Chest: Effort normal. No respiratory distress.  Musculoskeletal: Normal range of motion.  Neurological: He is alert and oriented to person, place, and time.  Skin: Skin is warm and dry.  Psychiatric: He has a normal mood and affect. His behavior is normal.  Nursing note and vitals reviewed.  LACERATION REPAIR Performed by: Charlann Lange Consent: Verbal consent obtained. Risks and benefits: risks, benefits and alternatives were discussed Patient identity confirmed: provided demographic data Time out performed prior to procedure Prepped and Draped in normal sterile fashion Wound explored Laceration Location: medial right ankle Laceration Length: pinpoint No  Foreign Bodies seen or palpated Anesthesia: local infiltration Local anesthetic: lidocaine 2% with epinephrine Anesthetic total: 1 ml Irrigation method: syringe Amount of cleaning: standard Skin closure: 4-0 prolene Number of sutures or staples: 2 Technique: simple interrupted in cross stitch fashion Patient tolerance: Patient tolerated the procedure well with no immediate complications.  ED Course  Procedures (including critical care time) DIAGNOSTIC STUDIES: Oxygen Saturation is 97% on RA, nl by my interpretation.    COORDINATION OF CARE: 8:43 PM-Discussed treatment plan with pt at bedside and pt agreed to plan.   Labs Review Labs Reviewed - No data to display  Imaging Review No results found.   EKG Interpretation None      MDM   Final diagnoses:  None    1. Bleeding varicose veins  Cross stitch over bleeding site performed. No further bleeding. Pressure dressing applied.   I personally performed the services described in this documentation, which was scribed in my presence. The recorded information has been reviewed and is accurate.     Charlann Lange, PA-C 04/09/14 [id number]  Debby Freiberg, MD 04/13/14 [phone]

## 2014-04-17 DIAGNOSIS — R600 Localized edema: Secondary | ICD-10-CM | POA: Diagnosis not present

## 2014-04-17 DIAGNOSIS — I83893 Varicose veins of bilateral lower extremities with other complications: Secondary | ICD-10-CM | POA: Diagnosis not present

## 2014-04-17 DIAGNOSIS — Z4802 Encounter for removal of sutures: Secondary | ICD-10-CM | POA: Diagnosis not present

## 2014-04-17 DIAGNOSIS — I872 Venous insufficiency (chronic) (peripheral): Secondary | ICD-10-CM | POA: Diagnosis not present

## 2014-04-17 DIAGNOSIS — Z6841 Body Mass Index (BMI) 40.0 and over, adult: Secondary | ICD-10-CM | POA: Diagnosis not present

## 2014-04-27 DIAGNOSIS — E785 Hyperlipidemia, unspecified: Secondary | ICD-10-CM | POA: Diagnosis not present

## 2014-04-27 DIAGNOSIS — E039 Hypothyroidism, unspecified: Secondary | ICD-10-CM | POA: Diagnosis not present

## 2014-04-27 DIAGNOSIS — I10 Essential (primary) hypertension: Secondary | ICD-10-CM | POA: Diagnosis not present

## 2014-04-27 DIAGNOSIS — Z125 Encounter for screening for malignant neoplasm of prostate: Secondary | ICD-10-CM | POA: Diagnosis not present

## 2014-04-27 DIAGNOSIS — Z008 Encounter for other general examination: Secondary | ICD-10-CM | POA: Diagnosis not present

## 2014-04-30 DIAGNOSIS — M1611 Unilateral primary osteoarthritis, right hip: Secondary | ICD-10-CM | POA: Diagnosis not present

## 2014-04-30 DIAGNOSIS — M170 Bilateral primary osteoarthritis of knee: Secondary | ICD-10-CM | POA: Diagnosis not present

## 2014-05-04 DIAGNOSIS — G4733 Obstructive sleep apnea (adult) (pediatric): Secondary | ICD-10-CM | POA: Diagnosis not present

## 2014-05-04 DIAGNOSIS — R351 Nocturia: Secondary | ICD-10-CM | POA: Diagnosis not present

## 2014-05-04 DIAGNOSIS — I83893 Varicose veins of bilateral lower extremities with other complications: Secondary | ICD-10-CM | POA: Diagnosis not present

## 2014-05-04 DIAGNOSIS — I6529 Occlusion and stenosis of unspecified carotid artery: Secondary | ICD-10-CM | POA: Diagnosis not present

## 2014-05-04 DIAGNOSIS — I872 Venous insufficiency (chronic) (peripheral): Secondary | ICD-10-CM | POA: Diagnosis not present

## 2014-05-04 DIAGNOSIS — E039 Hypothyroidism, unspecified: Secondary | ICD-10-CM | POA: Diagnosis not present

## 2014-05-04 DIAGNOSIS — R600 Localized edema: Secondary | ICD-10-CM | POA: Diagnosis not present

## 2014-05-04 DIAGNOSIS — Z1389 Encounter for screening for other disorder: Secondary | ICD-10-CM | POA: Diagnosis not present

## 2014-05-04 DIAGNOSIS — E785 Hyperlipidemia, unspecified: Secondary | ICD-10-CM | POA: Diagnosis not present

## 2014-05-04 DIAGNOSIS — Z6841 Body Mass Index (BMI) 40.0 and over, adult: Secondary | ICD-10-CM | POA: Diagnosis not present

## 2014-05-04 DIAGNOSIS — I10 Essential (primary) hypertension: Secondary | ICD-10-CM | POA: Diagnosis not present

## 2014-05-06 ENCOUNTER — Other Ambulatory Visit (HOSPITAL_COMMUNITY): Payer: Self-pay | Admitting: Internal Medicine

## 2014-05-06 ENCOUNTER — Ambulatory Visit (HOSPITAL_COMMUNITY)
Admission: RE | Admit: 2014-05-06 | Discharge: 2014-05-06 | Disposition: A | Discharge status: Home / Self Care | Payer: Medicare Other | Source: Ambulatory Visit | Attending: Vascular Surgery | Admitting: Vascular Surgery

## 2014-05-06 DIAGNOSIS — I6529 Occlusion and stenosis of unspecified carotid artery: Secondary | ICD-10-CM

## 2014-06-30 DIAGNOSIS — M1611 Unilateral primary osteoarthritis, right hip: Secondary | ICD-10-CM | POA: Diagnosis not present

## 2014-06-30 DIAGNOSIS — M25561 Pain in right knee: Secondary | ICD-10-CM | POA: Diagnosis not present

## 2014-06-30 DIAGNOSIS — M170 Bilateral primary osteoarthritis of knee: Secondary | ICD-10-CM | POA: Diagnosis not present

## 2014-06-30 DIAGNOSIS — M25551 Pain in right hip: Secondary | ICD-10-CM | POA: Diagnosis not present

## 2014-07-03 ENCOUNTER — Other Ambulatory Visit: Payer: Self-pay

## 2014-07-03 DIAGNOSIS — Z86718 Personal history of other venous thrombosis and embolism: Secondary | ICD-10-CM

## 2014-07-03 DIAGNOSIS — I872 Venous insufficiency (chronic) (peripheral): Secondary | ICD-10-CM

## 2014-07-03 DIAGNOSIS — R0989 Other specified symptoms and signs involving the circulatory and respiratory systems: Secondary | ICD-10-CM

## 2014-07-14 ENCOUNTER — Encounter: Payer: Self-pay | Admitting: Surgery

## 2014-07-17 ENCOUNTER — Encounter: Payer: Self-pay | Admitting: Surgery

## 2014-07-17 ENCOUNTER — Ambulatory Visit (INDEPENDENT_AMBULATORY_CARE_PROVIDER_SITE_OTHER): Payer: Medicare Other | Admitting: Surgery

## 2014-07-17 ENCOUNTER — Ambulatory Visit (HOSPITAL_COMMUNITY)
Admission: RE | Admit: 2014-07-17 | Discharge: 2014-07-17 | Disposition: A | Discharge status: Home / Self Care | Payer: Medicare Other | Source: Ambulatory Visit | Attending: Surgery | Admitting: Surgery

## 2014-07-17 ENCOUNTER — Ambulatory Visit (INDEPENDENT_AMBULATORY_CARE_PROVIDER_SITE_OTHER)
Admission: RE | Admit: 2014-07-17 | Discharge: 2014-07-17 | Disposition: A | Discharge status: Home / Self Care | Payer: Medicare Other | Source: Ambulatory Visit | Attending: Surgery | Admitting: Surgery

## 2014-07-17 VITALS — BP 136/85 | HR 87 | Resp 16 | Ht 68.0 in | Wt 286.4 lb

## 2014-07-17 DIAGNOSIS — I82531 Chronic embolism and thrombosis of right popliteal vein: Secondary | ICD-10-CM | POA: Insufficient documentation

## 2014-07-17 DIAGNOSIS — Z01818 Encounter for other preprocedural examination: Secondary | ICD-10-CM

## 2014-07-17 DIAGNOSIS — Z86718 Personal history of other venous thrombosis and embolism: Secondary | ICD-10-CM

## 2014-07-17 DIAGNOSIS — R0989 Other specified symptoms and signs involving the circulatory and respiratory systems: Secondary | ICD-10-CM

## 2014-07-17 DIAGNOSIS — I82811 Embolism and thrombosis of superficial veins of right lower extremities: Secondary | ICD-10-CM | POA: Insufficient documentation

## 2014-07-17 DIAGNOSIS — I872 Venous insufficiency (chronic) (peripheral): Secondary | ICD-10-CM | POA: Diagnosis not present

## 2014-07-17 DIAGNOSIS — I6529 Occlusion and stenosis of unspecified carotid artery: Secondary | ICD-10-CM | POA: Diagnosis not present

## 2014-07-17 DIAGNOSIS — Z01810 Encounter for preprocedural cardiovascular examination: Secondary | ICD-10-CM

## 2014-07-17 NOTE — Progress Notes (Deleted)
Filed Vitals:   07/17/14 1245 07/17/14 1248  BP: 151/76 151/76  Pulse: 87   Resp: 16   Height: 5\' 8"  (1.727 m)   Weight: 286 lb 6.4 oz (129.91 kg)   Body mass index is 43.56 kg/(m^2).

## 2014-07-17 NOTE — Progress Notes (Signed)
Filed Vitals:   07/17/14 1245 07/17/14 1248  BP: 151/76 136/85  Pulse: 87   Resp: 16   Height: 5\' 8"  (1.727 m)   Weight: 286 lb 6.4 oz (129.91 kg)   Body mass index is 43.56 kg/(m^2).

## 2014-07-17 NOTE — Progress Notes (Signed)
Patient name: Casey Parker MRN: [id number] DOB: [birth year] Sex: male   Referred by: Dr. Durward Fortes  Reason for referral:  Chief Complaint  Patient presents with  . New Evaluation    venous stasis changes bilateral lower extremities, referal from Dr. Durward Fortes for clearance in anticiaption of knee/ hip replacements,  has lost 95 lbs. in 1 year, hx of left LE DVT     HISTORY OF PRESENT ILLNESS: This is a 69 year old gentleman who comes in today for clearance for hip surgery secondary to osteoarthritis.  Patient has a history of DVT in the remote past.  He also has chronic leg swelling.  He has worn compression stockings in the past but is unable to get them on currently.  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 He is a former smoker, quit in 1990  He suffers from morbid obesity but has lost 100 pounds recently.  He states he is 50 pounds 5 his goal.  He does not endorse claudication symptoms.  He has no nonhealing wounds.  He suffers from hypercholesterolemia which is managed with a statin.  He is medically managed for hypertension.  Past Medical History  Diagnosis Date  . Hyperlipidemia   . Obesity   . Hypertension   . Osteoarthritis   . Migraines     H/O  . Thyroid disease     hypo  . Cellulitis     recurrent  . Transient ischemic attack (TIA)     presumed  . Diastolic dysfunction   . Left ventricular hypertrophy   . DVT (deep venous thrombosis)   . Stroke     11-2008    Past Surgical History  Procedure Laterality Date  . Hernia repair    . Hand surgery      right  . Other surgical history      tonsilectomy  . Adenomatous polypectomy      H/O    History   Social History  . Marital Status: Married    Spouse Name: N/A  . Number of Children: N/A  . Years of Education: N/A   Occupational History  . Not on file.   Social History Main Topics  . Smoking status: Former Smoker    Quit date:  01/31/1988  . Smokeless tobacco: Not on file  . Alcohol Use: Yes     Comment: on social occassions  . Drug Use: No  . Sexual Activity: Not on file   Other Topics Concern  . Not on file   Social History Narrative    Family History  Problem Relation Age of Onset  . Hypertension Father     Allergies as of 07/17/2014 - Review Complete 07/17/2014  Allergen Reaction Noted  . Augmentin [amoxicillin-pot clavulanate]  06/15/2011  . Other  06/15/2011  . Penicillins  06/15/2011    Current Outpatient Prescriptions on File Prior to Visit  Medication Sig Dispense Refill  . aspirin 325 MG tablet Take 325 mg by mouth daily.    Marland Kitchen levothyroxine (SYNTHROID, LEVOTHROID) 125 MCG tablet Take 125 mcg by mouth daily.    Marland Kitchen losartan (COZAAR) 100 MG tablet Take 100 mg by mouth daily.    . Multiple Vitamin (MULTIVITAMIN) tablet Take 1 tablet by mouth daily.    . Omega-3 Fatty Acids (FISH OIL PO) Take 1,600 Units by mouth daily.    . psyllium (METAMUCIL) 58.6 % powder Take 1 packet by mouth daily.    . simvastatin (  ZOCOR) 20 MG tablet Take 20 mg by mouth daily.     . Sulfamethoxazole-Trimethoprim (SULFAMETHOXAZOLE-TMP DS PO) Take 1 tablet by mouth daily.     No current facility-administered medications on file prior to visit.     REVIEW OF SYSTEMS: Please see history of present illness, otherwise negative  PHYSICAL EXAMINATION:  Filed Vitals:   07/17/14 1245 07/17/14 1248  BP: 151/76 136/85  Pulse: 87   Resp: 16   Height: 5\' 8"  (1.727 m)   Weight: 286 lb 6.4 oz (129.91 kg)    Body mass index is 43.56 kg/(m^2). General: The patient appears their stated age.   HEENT:  No gross abnormalities Pulmonary: Respirations are non-labored Abdomen: Soft and non-tender  Musculoskeletal: There are no major deformities.   Neurologic: No focal weakness or paresthesias are detected, Skin: There are no ulcer or rashes noted. Psychiatric: The patient has normal affect. Cardiovascular: There is a  regular rate and rhythm without significant murmur appreciated.  Palpable pedal pulses chronic venous stasis changes bilaterally pitting edema bilaterally.  No carotid bruits  Diagnostic Studies: ABIs were performed today.  These were within normal limits with triphasic waveforms. Venous Doppler studies were performed which shows chronic deep venous insufficiency Carotid Dopplers were also performed several months ago showed less than 40% stenosis bilaterally   Assessment:  Vascular clearance for surgery Plan: The patient has normal arterial blood flow to his legs.  He does have significant edema and venous stasis changes with venous insufficiency findings on ultrasound.  I do not think that this should preclude him from proceeding with orthopedic surgery.  I would encourage him to try to start wearing compression stockings to help prevent future complications of venous insufficiency.  He can proceed with orthopedic surgery.     Eldridge Abrahams, M.D. [facility] Office: [phone] Pager:  [phone]

## 2014-07-29 DIAGNOSIS — M1611 Unilateral primary osteoarthritis, right hip: Secondary | ICD-10-CM | POA: Diagnosis not present

## 2014-07-29 DIAGNOSIS — M170 Bilateral primary osteoarthritis of knee: Secondary | ICD-10-CM | POA: Diagnosis not present

## 2014-07-29 DIAGNOSIS — M25551 Pain in right hip: Secondary | ICD-10-CM | POA: Diagnosis not present

## 2014-07-31 ENCOUNTER — Encounter: Payer: Self-pay | Admitting: Gastroenterology

## 2014-08-13 ENCOUNTER — Other Ambulatory Visit (HOSPITAL_COMMUNITY): Payer: Self-pay | Admitting: Orthopaedic Surgery

## 2014-08-13 ENCOUNTER — Other Ambulatory Visit (HOSPITAL_COMMUNITY): Payer: Self-pay

## 2014-08-13 NOTE — Progress Notes (Signed)
Please put orders in Epic surgery 08-21-14 pre op 08-14-14 Thanks

## 2014-08-13 NOTE — Patient Instructions (Addendum)
YOUR PROCEDURE IS SCHEDULED ON : 08/21/14  REPORT TO [facility] MAIN ENTRANCE FOLLOW SIGNS TO EAST ELEVATOR - GO TO 3rd FLOOR CHECK IN AT 3 EAST NURSES STATION (SHORT STAY) AT:  9:45 AM  CALL THIS NUMBER IF YOU HAVE PROBLEMS THE MORNING OF SURGERY [phone]  REMEMBER:ONLY 1 PER PERSON MAY GO TO SHORT STAY WITH YOU TO GET READY THE MORNING OF YOUR SURGERY  DO NOT EAT FOOD OR DRINK LIQUIDS AFTER MIDNIGHT  TAKE THESE MEDICINES THE MORNING OF SURGERY: SYNTHROID / SIMVASTATIN / SULFA  YOU MAY NOT HAVE ANY METAL ON YOUR BODY INCLUDING HAIR PINS AND PIERCING'S. DO NOT WEAR JEWELRY, MAKEUP, LOTIONS, POWDERS OR PERFUMES. DO NOT WEAR NAIL POLISH. DO NOT SHAVE 48 HRS PRIOR TO SURGERY. MEN MAY SHAVE FACE AND NECK.  DO NOT [place]. [facility] IS NOT RESPONSIBLE FOR VALUABLES.  CONTACTS, DENTURES OR PARTIALS MAY NOT BE WORN TO SURGERY. LEAVE SUITCASE IN CAR. CAN BE BROUGHT TO ROOM AFTER SURGERY.  PATIENTS DISCHARGED THE DAY OF SURGERY WILL NOT BE ALLOWED TO DRIVE HOME.  PLEASE READ OVER THE FOLLOWING INSTRUCTION SHEETS _________________________________________________________________________________                                          [facility] - PREPARING FOR SURGERY  Before surgery, you can play an important role.  Because skin is not sterile, your skin needs to be as free of germs as possible.  You can reduce the number of germs on your skin by washing with CHG (chlorahexidine gluconate) soap before surgery.  CHG is an antiseptic cleaner which kills germs and bonds with the skin to continue killing germs even after washing. Please DO NOT use if you have an allergy to CHG or antibacterial soaps.  If your skin becomes reddened/irritated stop using the CHG and inform your nurse when you arrive at Short Stay. Do not shave (including legs and underarms) for at least 48 hours prior to the first CHG shower.  You may shave your face. Please follow  these instructions carefully:   1.  Shower with CHG Soap the night before surgery and the  morning of Surgery.   2.  If you choose to wash your hair, wash your hair first as usual with your  normal  Shampoo.   3.  After you shampoo, rinse your hair and body thoroughly to remove the  shampoo.                                         4.  Use CHG as you would any other liquid soap.  You can apply chg directly  to the skin and wash . Gently wash with scrungie or clean wascloth    5.  Apply the CHG Soap to your body ONLY FROM THE NECK DOWN.   Do not use on open                           Wound or open sores. Avoid contact with eyes, ears mouth and genitals (private parts).                        Genitals (private parts) with your normal  soap.              6.  Wash thoroughly, paying special attention to the area where your surgery  will be performed.   7.  Thoroughly rinse your body with warm water from the neck down.   8.  DO NOT shower/wash with your normal soap after using and rinsing off  the CHG Soap .                9.  Pat yourself dry with a clean towel.             10.  Wear clean night clothes to bed after shower             11.  Place clean sheets on your bed the night of your first shower and do not  sleep with pets.  Day of Surgery : Do not apply any lotions/deodorants the morning of surgery.  Please wear clean clothes to the hospital/surgery center.  FAILURE TO FOLLOW THESE INSTRUCTIONS MAY RESULT IN THE CANCELLATION OF YOUR SURGERY    PATIENT SIGNATURE_________________________________  ______________________________________________________________________

## 2014-08-14 ENCOUNTER — Encounter (HOSPITAL_COMMUNITY): Payer: Self-pay

## 2014-08-14 ENCOUNTER — Encounter (HOSPITAL_COMMUNITY)
Admission: RE | Admit: 2014-08-14 | Discharge: 2014-08-14 | Disposition: A | Discharge status: Home / Self Care | Payer: Medicare Other | Source: Ambulatory Visit | Attending: Orthopaedic Surgery | Admitting: Orthopaedic Surgery

## 2014-08-14 DIAGNOSIS — Z01810 Encounter for preprocedural cardiovascular examination: Secondary | ICD-10-CM | POA: Diagnosis not present

## 2014-08-14 DIAGNOSIS — Z01812 Encounter for preprocedural laboratory examination: Secondary | ICD-10-CM | POA: Insufficient documentation

## 2014-08-14 DIAGNOSIS — M1611 Unilateral primary osteoarthritis, right hip: Secondary | ICD-10-CM | POA: Diagnosis not present

## 2014-08-14 DIAGNOSIS — I10 Essential (primary) hypertension: Secondary | ICD-10-CM | POA: Diagnosis not present

## 2014-08-14 HISTORY — DX: Benign prostatic hyperplasia without lower urinary tract symptoms: N40.0

## 2014-08-14 LAB — BASIC METABOLIC PANEL
Anion gap: 6 (ref 5–15)
BUN: 15 mg/dL (ref 6–20)
CHLORIDE: 106 mmol/L (ref 101–111)
CO2: 25 mmol/L (ref 22–32)
CREATININE: 1.01 mg/dL (ref 0.61–1.24)
Calcium: 9.1 mg/dL (ref 8.9–10.3)
GLUCOSE: 109 mg/dL — AB (ref 65–99)
POTASSIUM: 4.4 mmol/L (ref 3.5–5.1)
SODIUM: 137 mmol/L (ref 135–145)

## 2014-08-14 LAB — CBC
HCT: 38 % — ABNORMAL LOW (ref 39.0–52.0)
Hemoglobin: 12.2 g/dL — ABNORMAL LOW (ref 13.0–17.0)
MCH: 30.3 pg (ref 26.0–34.0)
MCHC: 32.1 g/dL (ref 30.0–36.0)
MCV: 94.3 fL (ref 78.0–100.0)
PLATELETS: 207 10*3/uL (ref 150–400)
RBC: 4.03 MIL/uL — AB (ref 4.22–5.81)
RDW: 13.9 % (ref 11.5–15.5)
WBC: 9.1 10*3/uL (ref 4.0–10.5)

## 2014-08-14 LAB — ABO/RH: ABO/RH(D): O POS

## 2014-08-14 LAB — PROTIME-INR
INR: 1.14 (ref 0.00–1.49)
Prothrombin Time: 14.8 seconds (ref 11.6–15.2)

## 2014-08-14 LAB — SURGICAL PCR SCREEN
MRSA, PCR: NEGATIVE
STAPHYLOCOCCUS AUREUS: NEGATIVE

## 2014-08-14 LAB — APTT: aPTT: 33 seconds (ref 24–37)

## 2014-08-14 NOTE — Progress Notes (Signed)
   08/14/14 1341  OBSTRUCTIVE SLEEP APNEA  Have you ever been diagnosed with sleep apnea through a sleep study? No  Do you snore loudly (loud enough to be heard through closed doors)?  0  Do you often feel tired, fatigued, or sleepy during the daytime? 0  Has anyone observed you stop breathing during your sleep? 0  Do you have, or are you being treated for high blood pressure? 1  BMI more than 35 kg/m2? 1  Age over 70 years old? 1  Neck circumference greater than 40 cm/16 inches? 1  Gender: 1

## 2014-08-20 NOTE — Anesthesia Preprocedure Evaluation (Addendum)
Anesthesia Evaluation  Patient identified by MRN, date of birth, ID band Patient awake    Reviewed: Allergy & Precautions, NPO status , Patient's Chart, lab work & pertinent test results  History of Anesthesia Complications Negative for: history of anesthetic complications  Airway Mallampati: III  TM Distance: >3 FB Neck ROM: Full    Dental no notable dental hx. (+) Dental Advisory Given   Pulmonary former smoker,  breath sounds clear to auscultation  Pulmonary exam normal       Cardiovascular hypertension, Pt. on medications Normal cardiovascular examRhythm:Regular Rate:Normal     Neuro/Psych  Headaches, CVA, No Residual Symptoms negative psych ROS   GI/Hepatic negative GI ROS, Neg liver ROS,   Endo/Other  Hypothyroidism Morbid obesity  Renal/GU negative Renal ROS  negative genitourinary   Musculoskeletal  (+) Arthritis -, Osteoarthritis,    Abdominal (+) + obese,   Peds negative pediatric ROS (+)  Hematology negative hematology ROS (+)   Anesthesia Other Findings   Reproductive/Obstetrics negative OB ROS                            Anesthesia Physical Anesthesia Plan  ASA: III  Anesthesia Plan: Spinal   Post-op Pain Management:    Induction: Intravenous  Airway Management Planned:   Additional Equipment:   Intra-op Plan:   Post-operative Plan:   Informed Consent: I have reviewed the patients History and Physical, chart, labs and discussed the procedure including the risks, benefits and alternatives for the proposed anesthesia with the patient or authorized representative who has indicated his/her understanding and acceptance.   Dental advisory given  Plan Discussed with: CRNA  Anesthesia Plan Comments:        Anesthesia Quick Evaluation

## 2014-08-21 ENCOUNTER — Inpatient Hospital Stay (HOSPITAL_COMMUNITY): Payer: Medicare Other | Admitting: Anesthesiology

## 2014-08-21 ENCOUNTER — Encounter (HOSPITAL_COMMUNITY)
Admission: RE | Disposition: A | Discharge status: Skilled Nursing Facility | Payer: Self-pay | Source: Ambulatory Visit | Attending: Orthopaedic Surgery

## 2014-08-21 ENCOUNTER — Inpatient Hospital Stay (HOSPITAL_COMMUNITY)
Admission: RE | Admit: 2014-08-21 | Discharge: 2014-08-24 | DRG: 470 | Disposition: A | Discharge status: Skilled Nursing Facility | Payer: Medicare Other | Source: Ambulatory Visit | Attending: Orthopaedic Surgery | Admitting: Orthopaedic Surgery

## 2014-08-21 ENCOUNTER — Inpatient Hospital Stay (HOSPITAL_COMMUNITY): Payer: Medicare Other

## 2014-08-21 ENCOUNTER — Encounter (HOSPITAL_COMMUNITY): Payer: Self-pay

## 2014-08-21 DIAGNOSIS — M1611 Unilateral primary osteoarthritis, right hip: Secondary | ICD-10-CM | POA: Diagnosis not present

## 2014-08-21 DIAGNOSIS — M1990 Unspecified osteoarthritis, unspecified site: Secondary | ICD-10-CM | POA: Diagnosis not present

## 2014-08-21 DIAGNOSIS — Z6839 Body mass index (BMI) 39.0-39.9, adult: Secondary | ICD-10-CM

## 2014-08-21 DIAGNOSIS — E669 Obesity, unspecified: Secondary | ICD-10-CM | POA: Diagnosis present

## 2014-08-21 DIAGNOSIS — Z471 Aftercare following joint replacement surgery: Secondary | ICD-10-CM | POA: Diagnosis not present

## 2014-08-21 DIAGNOSIS — D62 Acute posthemorrhagic anemia: Secondary | ICD-10-CM | POA: Diagnosis not present

## 2014-08-21 DIAGNOSIS — Z8673 Personal history of transient ischemic attack (TIA), and cerebral infarction without residual deficits: Secondary | ICD-10-CM

## 2014-08-21 DIAGNOSIS — Z86718 Personal history of other venous thrombosis and embolism: Secondary | ICD-10-CM | POA: Diagnosis not present

## 2014-08-21 DIAGNOSIS — G43909 Migraine, unspecified, not intractable, without status migrainosus: Secondary | ICD-10-CM | POA: Diagnosis not present

## 2014-08-21 DIAGNOSIS — Z7982 Long term (current) use of aspirin: Secondary | ICD-10-CM | POA: Diagnosis not present

## 2014-08-21 DIAGNOSIS — R52 Pain, unspecified: Secondary | ICD-10-CM

## 2014-08-21 DIAGNOSIS — Z96641 Presence of right artificial hip joint: Secondary | ICD-10-CM | POA: Diagnosis not present

## 2014-08-21 DIAGNOSIS — Z87891 Personal history of nicotine dependence: Secondary | ICD-10-CM | POA: Diagnosis not present

## 2014-08-21 DIAGNOSIS — E039 Hypothyroidism, unspecified: Secondary | ICD-10-CM | POA: Diagnosis present

## 2014-08-21 DIAGNOSIS — Z01812 Encounter for preprocedural laboratory examination: Secondary | ICD-10-CM

## 2014-08-21 DIAGNOSIS — Z8249 Family history of ischemic heart disease and other diseases of the circulatory system: Secondary | ICD-10-CM

## 2014-08-21 DIAGNOSIS — M169 Osteoarthritis of hip, unspecified: Secondary | ICD-10-CM | POA: Diagnosis not present

## 2014-08-21 DIAGNOSIS — I10 Essential (primary) hypertension: Secondary | ICD-10-CM | POA: Diagnosis not present

## 2014-08-21 DIAGNOSIS — R2681 Unsteadiness on feet: Secondary | ICD-10-CM | POA: Diagnosis not present

## 2014-08-21 DIAGNOSIS — M159 Polyosteoarthritis, unspecified: Secondary | ICD-10-CM | POA: Diagnosis not present

## 2014-08-21 DIAGNOSIS — R278 Other lack of coordination: Secondary | ICD-10-CM | POA: Diagnosis not present

## 2014-08-21 DIAGNOSIS — E785 Hyperlipidemia, unspecified: Secondary | ICD-10-CM | POA: Diagnosis present

## 2014-08-21 DIAGNOSIS — M25551 Pain in right hip: Secondary | ICD-10-CM | POA: Diagnosis not present

## 2014-08-21 DIAGNOSIS — M6281 Muscle weakness (generalized): Secondary | ICD-10-CM | POA: Diagnosis not present

## 2014-08-21 HISTORY — PX: TOTAL HIP ARTHROPLASTY: SHX124

## 2014-08-21 LAB — TYPE AND SCREEN
ABO/RH(D): O POS
ANTIBODY SCREEN: NEGATIVE

## 2014-08-21 SURGERY — ARTHROPLASTY, HIP, TOTAL, ANTERIOR APPROACH
Anesthesia: Spinal | Site: Hip | Laterality: Right

## 2014-08-21 MED ORDER — LEVOTHYROXINE SODIUM 125 MCG PO TABS
125.0000 ug | ORAL_TABLET | Freq: Every day | ORAL | Status: DC
  Administered 2014-08-22 – 2014-08-24 (×3): 125 ug via ORAL
  Filled 2014-08-21 (×4): qty 1

## 2014-08-21 MED ORDER — PROPOFOL 10 MG/ML IV BOLUS
INTRAVENOUS | Status: AC
  Filled 2014-08-21: qty 20

## 2014-08-21 MED ORDER — ALUM & MAG HYDROXIDE-SIMETH 200-200-20 MG/5ML PO SUSP: 30.0000 mL | ORAL | Status: DC | PRN

## 2014-08-21 MED ORDER — PHENOL 1.4 % MT LIQD: 1.0000 | OROMUCOSAL | Status: DC | PRN

## 2014-08-21 MED ORDER — HYDROMORPHONE HCL 1 MG/ML IJ SOLN
1.0000 mg | INTRAMUSCULAR | Status: DC | PRN
  Administered 2014-08-21: 1 mg via INTRAVENOUS
  Filled 2014-08-21: qty 1

## 2014-08-21 MED ORDER — FENTANYL CITRATE (PF) 100 MCG/2ML IJ SOLN
INTRAMUSCULAR | Status: AC
  Filled 2014-08-21: qty 2

## 2014-08-21 MED ORDER — ONDANSETRON HCL 4 MG/2ML IJ SOLN: 4.0000 mg | Freq: Four times a day (QID) | INTRAMUSCULAR | Status: DC | PRN

## 2014-08-21 MED ORDER — DIPHENHYDRAMINE HCL 12.5 MG/5ML PO ELIX: 12.5000 mg | ORAL_SOLUTION | ORAL | Status: DC | PRN

## 2014-08-21 MED ORDER — ONDANSETRON HCL 4 MG PO TABS
4.0000 mg | ORAL_TABLET | Freq: Four times a day (QID) | ORAL | Status: DC | PRN
Start: 2014-08-21 — End: 2014-08-24

## 2014-08-21 MED ORDER — LOSARTAN POTASSIUM 50 MG PO TABS
100.0000 mg | ORAL_TABLET | Freq: Every day | ORAL | Status: DC
  Administered 2014-08-23 – 2014-08-24 (×2): 100 mg via ORAL
  Filled 2014-08-21 (×3): qty 2

## 2014-08-21 MED ORDER — FENTANYL CITRATE (PF) 100 MCG/2ML IJ SOLN
INTRAMUSCULAR | Status: DC | PRN
  Administered 2014-08-21 (×4): 50 ug via INTRAVENOUS

## 2014-08-21 MED ORDER — KETAMINE HCL 10 MG/ML IJ SOLN
INTRAMUSCULAR | Status: DC | PRN
  Administered 2014-08-21 (×6): 10 mg via INTRAVENOUS

## 2014-08-21 MED ORDER — METOCLOPRAMIDE HCL 10 MG PO TABS: 5.0000 mg | ORAL_TABLET | Freq: Three times a day (TID) | ORAL | Status: DC | PRN

## 2014-08-21 MED ORDER — GLYCOPYRROLATE 0.2 MG/ML IJ SOLN
INTRAMUSCULAR | Status: DC | PRN
  Administered 2014-08-21: 0.2 mg via INTRAVENOUS

## 2014-08-21 MED ORDER — CLINDAMYCIN PHOSPHATE 600 MG/50ML IV SOLN
600.0000 mg | Freq: Four times a day (QID) | INTRAVENOUS | Status: AC
  Administered 2014-08-21 (×2): 600 mg via INTRAVENOUS
  Filled 2014-08-21 (×2): qty 50

## 2014-08-21 MED ORDER — BUPIVACAINE HCL (PF) 0.5 % IJ SOLN
INTRAMUSCULAR | Status: DC | PRN
  Administered 2014-08-21: 3 mL

## 2014-08-21 MED ORDER — CLINDAMYCIN PHOSPHATE 900 MG/50ML IV SOLN
INTRAVENOUS | Status: AC
  Filled 2014-08-21: qty 50

## 2014-08-21 MED ORDER — MIDAZOLAM HCL 5 MG/5ML IJ SOLN
INTRAMUSCULAR | Status: DC | PRN
  Administered 2014-08-21 (×2): 1 mg via INTRAVENOUS

## 2014-08-21 MED ORDER — ACETAMINOPHEN 650 MG RE SUPP: 650.0000 mg | Freq: Four times a day (QID) | RECTAL | Status: DC | PRN

## 2014-08-21 MED ORDER — CLINDAMYCIN PHOSPHATE 900 MG/50ML IV SOLN
900.0000 mg | INTRAVENOUS | Status: AC
  Administered 2014-08-21: 900 mg via INTRAVENOUS

## 2014-08-21 MED ORDER — SULFAMETHOXAZOLE-TRIMETHOPRIM 800-160 MG PO TABS
1.0000 | ORAL_TABLET | Freq: Every day | ORAL | Status: DC
  Administered 2014-08-22 – 2014-08-24 (×3): 1 via ORAL
  Filled 2014-08-21 (×3): qty 1

## 2014-08-21 MED ORDER — LABETALOL HCL 5 MG/ML IV SOLN
INTRAVENOUS | Status: DC | PRN
  Administered 2014-08-21: 5 mg via INTRAVENOUS

## 2014-08-21 MED ORDER — ACETAMINOPHEN 325 MG PO TABS
650.0000 mg | ORAL_TABLET | Freq: Four times a day (QID) | ORAL | Status: DC | PRN
  Administered 2014-08-22 – 2014-08-23 (×3): 650 mg via ORAL
  Filled 2014-08-21 (×3): qty 2

## 2014-08-21 MED ORDER — OXYCODONE HCL 5 MG PO TABS
5.0000 mg | ORAL_TABLET | ORAL | Status: DC | PRN
  Administered 2014-08-21 – 2014-08-24 (×6): 10 mg via ORAL
  Filled 2014-08-21 (×6): qty 2

## 2014-08-21 MED ORDER — SODIUM CHLORIDE 0.9 % IV SOLN
INTRAVENOUS | Status: DC
Start: 2014-08-21 — End: 2014-08-22
  Administered 2014-08-21: 19:00:00 via INTRAVENOUS

## 2014-08-21 MED ORDER — ASPIRIN EC 325 MG PO TBEC
325.0000 mg | DELAYED_RELEASE_TABLET | Freq: Two times a day (BID) | ORAL | Status: DC
  Administered 2014-08-22 – 2014-08-24 (×5): 325 mg via ORAL
  Filled 2014-08-21 (×7): qty 1

## 2014-08-21 MED ORDER — METHOCARBAMOL 500 MG PO TABS
500.0000 mg | ORAL_TABLET | Freq: Four times a day (QID) | ORAL | Status: DC | PRN
  Administered 2014-08-21 – 2014-08-23 (×3): 500 mg via ORAL
  Filled 2014-08-21 (×3): qty 1

## 2014-08-21 MED ORDER — METOCLOPRAMIDE HCL 5 MG/ML IJ SOLN: 5.0000 mg | Freq: Three times a day (TID) | INTRAMUSCULAR | Status: DC | PRN

## 2014-08-21 MED ORDER — MENTHOL 3 MG MT LOZG: 1.0000 | LOZENGE | OROMUCOSAL | Status: DC | PRN

## 2014-08-21 MED ORDER — ADULT MULTIVITAMIN W/MINERALS CH
1.0000 | ORAL_TABLET | Freq: Every day | ORAL | Status: DC
  Administered 2014-08-21 – 2014-08-24 (×4): 1 via ORAL
  Filled 2014-08-21 (×5): qty 1

## 2014-08-21 MED ORDER — ONDANSETRON HCL 4 MG/2ML IJ SOLN
INTRAMUSCULAR | Status: DC | PRN
  Administered 2014-08-21: 4 mg via INTRAVENOUS

## 2014-08-21 MED ORDER — PROPOFOL 10 MG/ML IV BOLUS
INTRAVENOUS | Status: DC | PRN
  Administered 2014-08-21 (×2): 10 mg via INTRAVENOUS

## 2014-08-21 MED ORDER — KETAMINE HCL 10 MG/ML IJ SOLN
INTRAMUSCULAR | Status: AC
  Filled 2014-08-21: qty 1

## 2014-08-21 MED ORDER — ONDANSETRON HCL 4 MG/2ML IJ SOLN: 4.0000 mg | Freq: Once | INTRAMUSCULAR | Status: AC | PRN

## 2014-08-21 MED ORDER — MIDAZOLAM HCL 2 MG/2ML IJ SOLN
INTRAMUSCULAR | Status: AC
  Filled 2014-08-21: qty 2

## 2014-08-21 MED ORDER — SIMVASTATIN 20 MG PO TABS
20.0000 mg | ORAL_TABLET | Freq: Every day | ORAL | Status: DC
  Administered 2014-08-22 – 2014-08-24 (×3): 20 mg via ORAL
  Filled 2014-08-21 (×3): qty 1

## 2014-08-21 MED ORDER — DOCUSATE SODIUM 100 MG PO CAPS
100.0000 mg | ORAL_CAPSULE | Freq: Two times a day (BID) | ORAL | Status: DC
  Administered 2014-08-21 – 2014-08-24 (×6): 100 mg via ORAL

## 2014-08-21 MED ORDER — PROPOFOL INFUSION 10 MG/ML OPTIME
INTRAVENOUS | Status: DC | PRN
  Administered 2014-08-21: 50 ug/kg/min via INTRAVENOUS

## 2014-08-21 MED ORDER — FENTANYL CITRATE (PF) 100 MCG/2ML IJ SOLN: 25.0000 ug | INTRAMUSCULAR | Status: DC | PRN

## 2014-08-21 MED ORDER — LACTATED RINGERS IV SOLN
INTRAVENOUS | Status: DC
  Administered 2014-08-21 (×2): via INTRAVENOUS
  Administered 2014-08-21: 1000 mL via INTRAVENOUS

## 2014-08-21 MED ORDER — METHOCARBAMOL 1000 MG/10ML IJ SOLN
500.0000 mg | Freq: Four times a day (QID) | INTRAVENOUS | Status: DC | PRN
  Filled 2014-08-21: qty 5

## 2014-08-21 MED ORDER — FENTANYL CITRATE (PF) 250 MCG/5ML IJ SOLN
INTRAMUSCULAR | Status: AC
  Filled 2014-08-21: qty 5

## 2014-08-21 MED ORDER — GLYCOPYRROLATE 0.2 MG/ML IJ SOLN
INTRAMUSCULAR | Status: AC
  Filled 2014-08-21: qty 1

## 2014-08-21 SURGICAL SUPPLY — 44 items
APL SKNCLS STERI-STRIP NONHPOA (GAUZE/BANDAGES/DRESSINGS)
BAG SPEC THK2 15X12 ZIP CLS (MISCELLANEOUS)
BAG ZIPLOCK 12X15 (MISCELLANEOUS) IMPLANT
BENZOIN TINCTURE PRP APPL 2/3 (GAUZE/BANDAGES/DRESSINGS) IMPLANT
BLADE SAW SGTL 18X1.27X75 (BLADE) ×2 IMPLANT
CAPT HIP TOTAL 2 ×1 IMPLANT
CATH FOLEY LATEX FREE 16FR (CATHETERS) ×1 IMPLANT
CELLS DAT CNTRL 66122 CELL SVR (MISCELLANEOUS) ×1 IMPLANT
COVER PERINEAL POST (MISCELLANEOUS) ×2 IMPLANT
DRAPE C-ARM 42X120 X-RAY (DRAPES) ×2 IMPLANT
DRAPE STERI IOBAN 125X83 (DRAPES) ×2 IMPLANT
DRAPE U-SHAPE 47X51 STRL (DRAPES) ×6 IMPLANT
DRSG AQUACEL AG ADV 3.5X10 (GAUZE/BANDAGES/DRESSINGS) ×2 IMPLANT
DURAPREP 26ML APPLICATOR (WOUND CARE) ×3 IMPLANT
ELECT BLADE TIP CTD 4 INCH (ELECTRODE) ×2 IMPLANT
ELECT REM PT RETURN 9FT ADLT (ELECTROSURGICAL) ×2
ELECTRODE REM PT RTRN 9FT ADLT (ELECTROSURGICAL) ×1 IMPLANT
FACESHIELD WRAPAROUND (MASK) ×8 IMPLANT
FACESHIELD WRAPAROUND OR TEAM (MASK) ×4 IMPLANT
GAUZE XEROFORM 1X8 LF (GAUZE/BANDAGES/DRESSINGS) ×1 IMPLANT
GLOVE BIO SURGEON STRL SZ7.5 (GLOVE) ×2 IMPLANT
GLOVE BIOGEL PI IND STRL 8 (GLOVE) ×2 IMPLANT
GLOVE BIOGEL PI INDICATOR 8 (GLOVE) ×2
GLOVE ECLIPSE 8.0 STRL XLNG CF (GLOVE) ×2 IMPLANT
GOWN STRL REUS W/TWL XL LVL3 (GOWN DISPOSABLE) ×4 IMPLANT
HANDPIECE INTERPULSE COAX TIP (DISPOSABLE) ×2
KIT BASIN OR (CUSTOM PROCEDURE TRAY) ×2 IMPLANT
PACK TOTAL JOINT (CUSTOM PROCEDURE TRAY) ×2 IMPLANT
PEN SKIN MARKING BROAD (MISCELLANEOUS) ×2 IMPLANT
RETRACTOR WND ALEXIS 18 MED (MISCELLANEOUS) ×1 IMPLANT
RTRCTR WOUND ALEXIS 18CM MED (MISCELLANEOUS) ×2
SET HNDPC FAN SPRY TIP SCT (DISPOSABLE) ×1 IMPLANT
STAPLER VISISTAT 35W (STAPLE) ×1 IMPLANT
STRIP CLOSURE SKIN 1/2X4 (GAUZE/BANDAGES/DRESSINGS) IMPLANT
SUT ETHIBOND NAB CT1 #1 30IN (SUTURE) ×2 IMPLANT
SUT MNCRL AB 4-0 PS2 18 (SUTURE) ×1 IMPLANT
SUT VIC AB 0 CT1 36 (SUTURE) ×2 IMPLANT
SUT VIC AB 1 CT1 36 (SUTURE) ×2 IMPLANT
SUT VIC AB 2-0 CT1 27 (SUTURE) ×6
SUT VIC AB 2-0 CT1 TAPERPNT 27 (SUTURE) ×2 IMPLANT
TOWEL OR 17X26 10 PK STRL BLUE (TOWEL DISPOSABLE) ×2 IMPLANT
TOWEL OR NON WOVEN STRL DISP B (DISPOSABLE) ×2 IMPLANT
TRAY FOLEY W/METER SILVER 14FR (SET/KITS/TRAYS/PACK) ×1 IMPLANT
YANKAUER SUCT BULB TIP 10FT TU (MISCELLANEOUS) ×2 IMPLANT

## 2014-08-21 NOTE — Care Management Note (Signed)
Case Management Note  Patient Details  Name: PHARES DIEMERT MRN: [id number] Date of Birth: 1945-11-10  Subjective/Objective:      69 y.o. M admitted for THA. Lives with spouse in private residence. Will await PT/OT evaluation for discharge needs               Action/Plan: Continue to follow   Expected Discharge Date:  08/23/14               Expected Discharge Plan:     In-House Referral:     Discharge planning Services     Post Acute Care Choice:    Choice offered to:     DME Arranged:    DME Agency:     HH Arranged:    [place] Agency:     Status of Service:     Medicare Important Message Given:    Date Medicare IM Given:    Medicare IM give by:    Date Additional Medicare IM Given:    Additional Medicare Important Message give by:     If discussed at [place] of Stay Meetings, dates discussed:    Additional Comments:  Delrae Sawyers, RN 08/21/2014, 4:06 PM

## 2014-08-21 NOTE — Anesthesia Postprocedure Evaluation (Signed)
  Anesthesia Post-op Note  Patient: Casey Parker  Procedure(s) Performed: Procedure(s) (LRB): RIGHT TOTAL HIP ARTHROPLASTY ANTERIOR APPROACH (Right)  Patient Location: PACU  Anesthesia Type: Spinal  Level of Consciousness: awake and alert   Airway and Oxygen Therapy: Patient Spontanous Breathing  Post-op Pain: mild  Post-op Assessment: Post-op Vital signs reviewed, Patient's Cardiovascular Status Stable, Respiratory Function Stable, Patent Airway and No signs of Nausea or vomiting  Last Vitals:  Filed Vitals:   08/21/14 1445  BP: 120/61  Pulse: 69  Temp:   Resp: 21    Post-op Vital Signs: stable   Complications: No apparent anesthesia complications

## 2014-08-21 NOTE — Brief Op Note (Signed)
08/21/2014  2:03 PM  PATIENT:  Casey Parker  69 y.o. male  PRE-OPERATIVE DIAGNOSIS:  severe osteoarthritis right hip  POST-OPERATIVE DIAGNOSIS:  severe osteoarthritis right hip  PROCEDURE:  Procedure(s): RIGHT TOTAL HIP ARTHROPLASTY ANTERIOR APPROACH (Right)  SURGEON:  Surgeon(s) and Role:    * Mcarthur Rossetti, MD - Primary  PHYSICIAN ASSISTANT: Benita Stabile, PA-C  ANESTHESIA:   spinal  EBL:  Total I/O In: 2000 [I.V.:2000] Out: 1100 [Urine:300; Blood:800]  BLOOD ADMINISTERED:none  DRAINS: none   LOCAL MEDICATIONS USED:  NONE  SPECIMEN:  No Specimen  DISPOSITION OF SPECIMEN:  N/A  COUNTS:  YES  TOURNIQUET:  * No tourniquets in log *  DICTATION: .Other Dictation: Dictation Number [id number]  PLAN OF CARE: Admit to inpatient   PATIENT DISPOSITION:  PACU - hemodynamically stable.   Delay start of Pharmacological VTE agent (>24hrs) due to surgical blood loss or risk of bleeding: no

## 2014-08-21 NOTE — Transfer of Care (Signed)
Immediate Anesthesia Transfer of Care Note  Patient: Casey Parker  Procedure(s) Performed: Procedure(s): RIGHT TOTAL HIP ARTHROPLASTY ANTERIOR APPROACH (Right)  Patient Location: PACU  Anesthesia Type:Spinal  Level of Consciousness:  sedated, patient cooperative and responds to stimulation  Airway & Oxygen Therapy:Patient Spontanous Breathing and Patient connected to face mask oxgen  Post-op Assessment:  Report given to PACU RN and Post -op Vital signs reviewed and stable  Post vital signs:  Reviewed and stable, Spinal L3, patient reports very slight pain at hip.  Last Vitals:  Filed Vitals:   08/21/14 1430  BP:   Pulse: 72  Temp:   Resp: 13    Complications: No apparent anesthesia complications

## 2014-08-21 NOTE — H&P (Signed)
TOTAL HIP ADMISSION H&P  Patient is admitted for right total hip arthroplasty.  Subjective:  Chief Complaint: right hip pain  HPI: Casey Parker, 69 y.o. male, has a history of pain and functional disability in the right hip(s) due to arthritis and patient has failed non-surgical conservative treatments for greater than 12 weeks to include NSAID's and/or analgesics, corticosteriod injections, flexibility and strengthening excercises, use of assistive devices, weight reduction as appropriate and activity modification.  Onset of symptoms was gradual starting 5 years ago with gradually worsening course since that time.The patient noted no past surgery on the right hip(s).  Patient currently rates pain in the right hip at 10 out of 10 with activity. Patient has night pain, worsening of pain with activity and weight bearing, trendelenberg gait, pain that interfers with activities of daily living, pain with passive range of motion and crepitus. Patient has evidence of subchondral sclerosis, periarticular osteophytes and joint space narrowing by imaging studies. This condition presents safety issues increasing the risk of falls.  There is no current active infection.  Patient Active Problem List   Diagnosis Date Noted  . Osteoarthritis of right hip 08/21/2014  . Obesity   . Osteoarthritis   . Migraines   . Cellulitis   . Transient ischemic attack (TIA)   . Diastolic dysfunction   . Left ventricular hypertrophy    Past Medical History  Diagnosis Date  . Hyperlipidemia   . Obesity   . Hypertension   . Osteoarthritis   . Migraines     H/O  . Thyroid disease     hypo  . Cellulitis     recurrent  . Transient ischemic attack (TIA)     presumed  . Diastolic dysfunction   . Left ventricular hypertrophy   . DVT (deep venous thrombosis)     20 yrs ago  . Stroke     11-2008 no residual problems  . Enlarged prostate     Past Surgical History  Procedure Laterality Date  . Hernia repair     . Hand surgery      right  . Tonsillectomy      Prescriptions prior to admission  Medication Sig Dispense Refill Last Dose  . aspirin 325 MG tablet Take 325 mg by mouth daily.   Taking  . levothyroxine (SYNTHROID, LEVOTHROID) 125 MCG tablet Take 125 mcg by mouth daily.   Taking  . losartan (COZAAR) 100 MG tablet Take 100 mg by mouth daily.   Past Week at Unknown time  . Multiple Vitamin (MULTIVITAMIN) tablet Take 1 tablet by mouth daily.   Taking  . Omega-3 Fatty Acids (FISH OIL PO) Take 1,600 Units by mouth daily.   Taking  . psyllium (METAMUCIL) 58.6 % powder Take 1 packet by mouth daily.   Taking  . simvastatin (ZOCOR) 20 MG tablet Take 20 mg by mouth daily.    Taking  . Sulfamethoxazole-Trimethoprim (SULFAMETHOXAZOLE-TMP DS PO) Take 1 tablet by mouth daily.   Taking   Allergies  Allergen Reactions  . Augmentin [Amoxicillin-Pot Clavulanate]   . Other     buine products -itching  . Penicillins     Pt unsure if allergic to penicillin    History  Substance Use Topics  . Smoking status: Former Smoker    Quit date: 01/31/1988  . Smokeless tobacco: Not on file  . Alcohol Use: Yes     Comment: on social occassions    Family History  Problem Relation Age of Onset  . Hypertension  Father      Review of Systems  Musculoskeletal: Positive for joint pain.  All other systems reviewed and are negative.   Objective:  Physical Exam  Constitutional: He is oriented to person, place, and time. He appears well-developed and well-nourished.  HENT:  Head: Normocephalic and atraumatic.  Eyes: EOM are normal. Pupils are equal, round, and reactive to light.  Neck: Normal range of motion. Neck supple.  Cardiovascular: Normal rate and regular rhythm.   Respiratory: Effort normal and breath sounds normal.  GI: Soft. Bowel sounds are normal.  Musculoskeletal:       Right hip: He exhibits decreased range of motion, tenderness, bony tenderness and crepitus.  Neurological: He is alert and  oriented to person, place, and time.  Skin: Skin is warm and dry.  Psychiatric: He has a normal mood and affect.    Vital signs in last 24 hours:    Labs:   Estimated body mass index is 43.56 kg/(m^2) as calculated from the following:   Height as of 07/17/14: 5\' 8"  (1.727 m).   Weight as of 07/17/14: 129.91 kg (286 lb 6.4 oz).   Imaging Review Plain radiographs demonstrate severe degenerative joint disease of the right hip(s). The bone quality appears to be good for age and reported activity level.  Assessment/Plan:  End stage arthritis, right hip(s)  The patient history, physical examination, clinical judgement of the provider and imaging studies are consistent with end stage degenerative joint disease of the right hip(s) and total hip arthroplasty is deemed medically necessary. The treatment options including medical management, injection therapy, arthroscopy and arthroplasty were discussed at length. The risks and benefits of total hip arthroplasty were presented and reviewed. The risks due to aseptic loosening, infection, stiffness, dislocation/subluxation,  thromboembolic complications and other imponderables were discussed.  The patient acknowledged the explanation, agreed to proceed with the plan and consent was signed. Patient is being admitted for inpatient treatment for surgery, pain control, PT, OT, prophylactic antibiotics, VTE prophylaxis, progressive ambulation and ADL's and discharge planning.The patient is planning to be discharged home with home health services

## 2014-08-21 NOTE — Anesthesia Procedure Notes (Signed)
Spinal Patient location during procedure: OR Staffing Anesthesiologist: Lauretta Grill Resident/CRNA: Darlys Gales R Performed by: anesthesiologist and resident/CRNA  Preanesthetic Checklist Completed: patient identified, site marked, surgical consent, pre-op evaluation, timeout performed, IV checked, risks and benefits discussed and monitors and equipment checked Spinal Block Patient position: sitting Prep: ChloraPrep Patient monitoring: heart rate, continuous pulse ox and blood pressure Location: L4-5 Injection technique: single-shot Needle Needle type: Spinocan  Needle gauge: 22 G Needle length: 9 cm Additional Notes Expiration date of kit checked and confirmed. Patient tolerated procedure well, without complications. Multiple attempts by CRNA first and os encountered with no CSF return. Dropped down one space, attempted paramedian and os encountered, attempted midline and clear CSF return. No heme, no paresthesia, motor block achieved. -MJudd

## 2014-08-21 NOTE — Op Note (Signed)
NAMEMarland Kitchen  FAVIO, BREZINA NO.:  [id number]  MEDICAL RECORD NO.:  [id number]  LOCATION:  [place]                         FACILITY:  [facility]  PHYSICIAN:  Lind Guest. Ninfa Linden, M.D.DATE OF BIRTH:  [birth year]  DATE OF PROCEDURE:  08/21/2014 DATE OF DISCHARGE:                              OPERATIVE REPORT   PREOPERATIVE DIAGNOSES:  Severe osteoarthritis and degenerative joint disease, right hip.  POSTOPERATIVE DIAGNOSES:  Severe osteoarthritis and degenerative joint disease, right hip.  PROCEDURE:  Right total hip arthroplasty through direct anterior approach.  IMPLANTS:  DePuy Sector Gription acetabular components, size 56 with a single screw, size 36+ 0 neutral polyethylene liner, size 15 Corail femoral component with varus offset (KLA), size 36- 2 metal hip ball.  SURGEON:  Lind Guest. Ninfa Linden, M.D.  ASSISTANT:  Erskine Emery, PA-C  ANESTHESIA:  Spinal.  ANTIBIOTICS:  900 mg of IV clindamycin.  BLOOD LOSS:  Anywhere from 500 to 800 mL.  COMPLICATIONS:  None.  INDICATION:  Mr. Laughman is a 69 year old gentleman with debilitating arthritis involving his right hip.  At his highest weight, he was about 380 pounds and has lost well over 100 pounds over the last 2 years.  He is down in the 270s.  He has such pain for arthritis of his right hip that it is affecting his activities of daily living, his quality of life, and his mobility.  I examined him in the office and laid him in a supine position on the exam table and felt that we had a good approach soft tissue wise for proceeding with an anterior hip replacement surgery.  He understands the risks of acute blood loss anemia, nerve and vessel injury, fracture, infection, dislocation, DVT, and all these risks were high given his obesity.  He also understands our goals are decreased pain, improved mobility, and overall improved quality of life. He has failed conservative treatment with injections,  anti- inflammatories, weight loss, using assistive device, and activity modification.  He does wish to proceed with the surgery.  PROCEDURE DESCRIPTION:  After informed consent was obtained, appropriate right hip was marked.  He was brought to the operating room and spinal anesthesia was obtained while he was on the stretcher.  He was then laid in a supine position.  Traction boots were placed on both his feet and a Foley catheter was placed.  He was then next placed supine on the HANA fracture table with the perineal post in place and both legs in inline skeletal traction devices, but no traction applied.  His right operative hip was then prepped and draped with DuraPrep and sterile drapes.  Time- out was called.  He was identified as correct patient, correct right hip.  We then made an incision inferior and posterior to the anterosuperior iliac spine and carried this obliquely down the leg.  We dissected down tensor fascia lata muscle.  The tensor fascia was then divided longitudinally, so we could proceed with a direct anterior approach to the hip.  We identified and cauterized the lateral femoral circumflex vessels and then identified our hip capsule.  We removed the hip capsule pretty much in its entirety, performed a capsulectomy due to the  difficulty of the soft tissues and the exposure.  We were able to place Cobra retractors around the medial and lateral femoral neck and then we made our femoral neck cut proximal to the lesser trochanter with an oscillating saw and completed this with an osteotome.  We placed a corkscrew guide in the femoral head and removed the femoral head in its entirety and found it to be devoid of cartilage.  We found severe sclerotic changes in the acetabulum.  We placed a bent Hohmann over the medial acetabular rim and released transverse acetabular ligament and then cleaned the acetabular remnants of acetabular labrum.  We then began reaming under direct  visualization and direct fluoroscopy from a size 42 all the way up to a size 56, with all reamers under visualization, the last 2 reamers under direct fluoroscopy, so we could obtain our depth of reamer, inclination, and anteversion.  Once we were pleased with this, we placed the real DePuy Sector Gription acetabular component size 56, a single screw and a 36+ 0 liner for a size 56 acetabular component.  Attention was then turned to the femur.  The leg was externally rotated to 100 degrees, extended and adducted.  We were able to place a Mueller retractor medially and a Hohmann retractor behind the greater trochanter.  I released the joint capsule from the piriformis and used a box cutting osteotome to enter the femoral canal and a rongeur to lateralize.  We then began broaching using the Corail broaching system from a size 8 all the way to a size 15.  With the size 15, we trialed a standard neck and a 36+ 1.5 hip ball, rolled leg back over and up with traction and internal rotation within the pelvis, and we were pleased with offset and leg length showed that it was tight and we could probably go with just a little bit shorter ball.  We then dislocated the hip and removed the trial components.  We were able to place a real size 15 Corail femoral component with varus offset and then the real 36- 2 metal hip ball and reduced this back in the acetabulum and replaced with stability and leg length and offset.  We then irrigated the soft tissue with normal saline solution.  We closed the iliotibial band with interrupted #1 Vicryl suture followed by 0-Vicryl in the deep tissue, 2-0 Vicryl in the subcutaneous tissue, and interrupted staples on the skin.  Xeroform and Aquacel dressings were applied.  He was then taken off the HANA table and taken to the recovery room in a stable condition.  All final counts were correct.  There were no complications noted.  Of note, Erskine Emery, PA-C assistant  during the entire case, his assistance was quite crucial for facilitating all aspects of this case.     Lind Guest. Ninfa Linden, M.D.     CYB/MEDQ  D:  08/21/2014  T:  08/21/2014  Job:  [id number]

## 2014-08-21 NOTE — Discharge Instructions (Signed)

## 2014-08-22 LAB — CBC
HCT: 30.2 % — ABNORMAL LOW (ref 39.0–52.0)
Hemoglobin: 9.7 g/dL — ABNORMAL LOW (ref 13.0–17.0)
MCH: 30.1 pg (ref 26.0–34.0)
MCHC: 32.1 g/dL (ref 30.0–36.0)
MCV: 93.8 fL (ref 78.0–100.0)
Platelets: 168 10*3/uL (ref 150–400)
RBC: 3.22 MIL/uL — AB (ref 4.22–5.81)
RDW: 13.7 % (ref 11.5–15.5)
WBC: 7.3 10*3/uL (ref 4.0–10.5)

## 2014-08-22 LAB — BASIC METABOLIC PANEL
Anion gap: 4 — ABNORMAL LOW (ref 5–15)
BUN: 10 mg/dL (ref 6–20)
CHLORIDE: 100 mmol/L — AB (ref 101–111)
CO2: 29 mmol/L (ref 22–32)
Calcium: 8.2 mg/dL — ABNORMAL LOW (ref 8.9–10.3)
Creatinine, Ser: 0.93 mg/dL (ref 0.61–1.24)
GLUCOSE: 123 mg/dL — AB (ref 65–99)
Potassium: 4.4 mmol/L (ref 3.5–5.1)
Sodium: 133 mmol/L — ABNORMAL LOW (ref 135–145)

## 2014-08-22 NOTE — Progress Notes (Signed)
   Subjective:  Patient reports pain as mild.    Objective:   VITALS:   Filed Vitals:   08/21/14 1844 08/21/14 2146 08/22/14 0212 08/22/14 0600  BP: 133/74 104/59 147/131 117/56  Pulse: 62 80 87 87  Temp: 98 F (36.7 C) 99.7 F (37.6 C) 100 F (37.8 C) 100.4 F (38 C)  TempSrc: Oral Oral Oral Oral  Resp: 16 16 14 16   Height:      Weight:      SpO2: 97% 95% 100% 98%    Neurologically intact Neurovascular intact Sensation intact distally Intact pulses distally Dorsiflexion/Plantar flexion intact Incision: dressing C/D/I and no drainage No cellulitis present Compartment soft good quad function   Lab Results  Component Value Date   WBC 7.3 08/22/2014   HGB 9.7* 08/22/2014   HCT 30.2* 08/22/2014   MCV 93.8 08/22/2014   PLT 168 08/22/2014     Assessment/Plan:  1 Day Post-Op   - Expected postop acute blood loss anemia - will monitor for symptoms - Up with PT/OT - DVT ppx - SCDs, ambulation, asa - WBAT right lower extremity - Pain control - Discharge planning  Marianna Payment 08/22/2014, 8:38 AM [phone]

## 2014-08-22 NOTE — Evaluation (Signed)
Physical Therapy Evaluation Patient Details Name: Casey Parker MRN: [id number] DOB: [birth year] Today's Date: 08/22/2014   History of Present Illness  pt was admitted for R DA THA  Clinical Impression  Pt s/p R THR presents with decreased R LE strength/ROM; decreased L LE strength/ROM, post op pain, and obesity limiting functional mobility.  Pt hopes to progress to dc to home with family assist and HHPT follow up but may need to consider dc to short term SNF level rehab dependent on acute stay progress.    Follow Up Recommendations Home health PT;SNF    Equipment Recommendations  Rolling walker with 5" wheels    Recommendations for Other Services OT consult     Precautions / Restrictions Precautions Precautions: Fall Restrictions Weight Bearing Restrictions: No      Mobility  Bed Mobility Overal bed mobility: Needs Assistance;+2 for physical assistance Bed Mobility: Supine to Sit     Supine to sit: +2 for physical assistance;HOB elevated;Mod assist     General bed mobility comments: assist for bil LEs and trunk  Transfers Overall transfer level: Needs assistance Equipment used: Rolling walker (2 wheeled) Transfers: Sit to/from Stand Sit to Stand: Mod assist;+2 physical assistance;From elevated surface         General transfer comment: assist to power up and stabilize  Ambulation/Gait Ambulation/Gait assistance: Min assist;Mod assist;+2 safety/equipment Ambulation Distance (Feet): 26 Feet Assistive device: Rolling walker (2 wheeled) Gait Pattern/deviations: Step-to pattern;Decreased step length - right;Decreased step length - left;Shuffle;Trunk flexed Gait velocity: decr   General Gait Details: cues for posture, sequence and postion from RW - ltd by onset dizziness (BP 100/46)  Stairs            Wheelchair Mobility    Modified Rankin (Stroke Patients Only)       Balance                                             Pertinent  Vitals/Pain Pain Assessment: 0-10 Pain Score: 3  Pain Location: R hip and back Pain Descriptors / Indicators: Burning Pain Intervention(s): Limited activity within patient's tolerance;Monitored during session;Premedicated before session;Repositioned;Ice applied    Home Living Family/patient expects to be discharged to:: Private residence Living Arrangements: Spouse/significant other Available Help at Discharge: Family Type of Home: House Home Access: Stairs to enter Entrance Stairs-Rails: None Technical brewer of Steps: 3 Home Layout: Two level Home Equipment: Cane - single point Additional Comments: did not use commode--too low (had BM in shower)    Prior Function Level of Independence: Independent with assistive device(s)               Hand Dominance        Extremity/Trunk Assessment   Upper Extremity Assessment: Generalized weakness           Lower Extremity Assessment: RLE deficits/detail;LLE deficits/detail RLE Deficits / Details: Hip strength 2/5 with AAROM at hip to 70 flex and 15 abd LLE Deficits / Details: Hip and knee flex ltd to 70 with strength 4-/5  Cervical / Trunk Assessment:  (Pt stands in flexed position)  Communication   Communication: No difficulties  Cognition Arousal/Alertness: Awake/alert Behavior During Therapy: WFL for tasks assessed/performed Overall Cognitive Status: Within Functional Limits for tasks assessed                      General Comments  Exercises Total Joint Exercises Ankle Circles/Pumps: AROM;Both;15 reps;Supine Quad Sets: AROM;Both;10 reps;Supine Heel Slides: AAROM;Right;15 reps;Supine Hip ABduction/ADduction: AAROM;Right;10 reps;Supine      Assessment/Plan    PT Assessment Patient needs continued PT services  PT Diagnosis Difficulty walking   PT Problem List Decreased strength;Decreased range of motion;Decreased activity tolerance;Decreased mobility;Decreased knowledge of use of  DME;Obesity;Pain  PT Treatment Interventions DME instruction;Gait training;Stair training;Functional mobility training;Therapeutic activities;Therapeutic exercise;Patient/family education   PT Goals (Current goals can be found in the Care Plan section) Acute Rehab PT Goals Patient Stated Goal: home and maximize independence with decreased pain PT Goal Formulation: With patient Time For Goal Achievement: 08/29/14 Potential to Achieve Goals: Good    Frequency 7X/week   Barriers to discharge        Co-evaluation PT/OT/SLP Co-Evaluation/Treatment: Yes Reason for Co-Treatment: For patient/therapist safety PT goals addressed during session: Mobility/safety with mobility OT goals addressed during session: ADL's and self-care       End of Session Equipment Utilized During Treatment: Gait belt Activity Tolerance: Patient tolerated treatment well Patient left: in chair;with call bell/phone within reach Nurse Communication: Mobility status         Time: 0810-0858 PT Time Calculation (min) (ACUTE ONLY): 48 min   Charges:   PT Evaluation $Initial PT Evaluation Tier I: 1 Procedure PT Treatments $Gait Training: 8-22 mins $Therapeutic Exercise: 8-22 mins   PT G Codes:        Casey Parker September 14, 2014, 11:38 AM

## 2014-08-22 NOTE — Plan of Care (Signed)
Problem: Phase I Progression Outcomes Goal: Dangle or out of bed evening of surgery Outcome: Not Met (add Reason) Pt refused

## 2014-08-22 NOTE — Progress Notes (Signed)
Physical Therapy Treatment Patient Details Name: Casey Parker MRN: [id number] DOB: [birth year] Today's Date: 08/22/2014    History of Present Illness pt was admitted for R DA THA    PT Comments    Pt progressing with ambulation but with significant difficulty performing transfers 2* obesity and ltd ability to self assist with non-operative LE  Follow Up Recommendations  Home health PT;SNF     Equipment Recommendations  Rolling walker with 5" wheels    Recommendations for Other Services OT consult     Precautions / Restrictions Precautions Precautions: Fall Restrictions Weight Bearing Restrictions: No    Mobility  Bed Mobility Overal bed mobility: Needs Assistance;+2 for physical assistance Bed Mobility: Sit to Supine       Sit to supine: +2 for physical assistance;Mod assist   General bed mobility comments: assist for bil LEs and trunk  Transfers Overall transfer level: Needs assistance Equipment used: Rolling walker (2 wheeled) Transfers: Sit to/from Stand Sit to Stand: +2 physical assistance;From elevated surface;Mod assist;Max assist         General transfer comment: assist to power up and stabilize:  Pt with ltd movement non-op leg and inability to pull under to self assist.  Ambulation/Gait Ambulation/Gait assistance: Min assist;+2 safety/equipment Ambulation Distance (Feet): 68 Feet Assistive device: Rolling walker (2 wheeled) Gait Pattern/deviations: Step-to pattern;Step-through pattern;Decreased step length - right;Decreased step length - left;Shuffle;Trunk flexed Gait velocity: decr   General Gait Details: cues for posture, sequence and postion from RW - ltd by onset dizziness (BP 100/46)   Stairs            Wheelchair Mobility    Modified Rankin (Stroke Patients Only)       Balance                                    Cognition Arousal/Alertness: Awake/alert Behavior During Therapy: WFL for tasks  assessed/performed Overall Cognitive Status: Within Functional Limits for tasks assessed                      Exercises      General Comments        Pertinent Vitals/Pain Pain Assessment: 0-10 Pain Score: 4  Pain Location: R hip and back Pain Descriptors / Indicators: Aching;Burning;Sore Pain Intervention(s): Limited activity within patient's tolerance;Monitored during session;Premedicated before session;Ice applied    Home Living                      Prior Function            PT Goals (current goals can now be found in the care plan section) Acute Rehab PT Goals Patient Stated Goal: home and maximize independence with decreased pain PT Goal Formulation: With patient Time For Goal Achievement: 08/29/14 Potential to Achieve Goals: Good Progress towards PT goals: Progressing toward goals    Frequency  7X/week    PT Plan Current plan remains appropriate    Co-evaluation             End of Session Equipment Utilized During Treatment: Gait belt Activity Tolerance: Patient tolerated treatment well Patient left: in bed;with call bell/phone within reach;with family/visitor present     Time: [id number] PT Time Calculation (min) (ACUTE ONLY): 30 min  Charges:  $Gait Training: 8-22 mins $Therapeutic Activity: 8-22 mins  G Codes:      Ichiro Chesnut 09-05-2014, 4:50 PM

## 2014-08-22 NOTE — Evaluation (Signed)
Occupational Therapy Evaluation Patient Details Name: Casey Parker MRN: [id number] DOB: [birth year] Today's Date: 08/22/2014    History of Present Illness pt was admitted for R DA THA   Clinical Impression   This 69 year old man was admitted for the above surgery.  He needs A x 2 for mobility and LB adls.  Pt was mod I at home with adls with difficulty.  He currently needs up to total A + 2 for LB dressing.  He will benefit from skilled OT and goals in acute setting are set for min A.    Follow Up Recommendations  Home health OT;SNF (vs, depending upon progress.  Pt wants home)    Equipment Recommendations  3 in 1 bedside comode    Recommendations for Other Services       Precautions / Restrictions Precautions Precautions: Fall Restrictions Weight Bearing Restrictions: No      Mobility Bed Mobility Overal bed mobility: Needs Assistance Bed Mobility: Supine to Sit     Supine to sit: Mod assist;+2 for physical assistance;HOB elevated     General bed mobility comments: assist for bil LEs and trunk  Transfers Overall transfer level: Needs assistance Equipment used: Rolling walker (2 wheeled) Transfers: Sit to/from Stand Sit to Stand: Mod assist;+2 physical assistance;From elevated surface         General transfer comment: assist to power up and stabilize    Balance                                            ADL Overall ADL's : Needs assistance/impaired             Lower Body Bathing: Maximal assistance;+2 for physical assistance;Sit to/from stand       Lower Body Dressing: Total assistance;+2 for physical assistance;Sit to/from stand   Toilet Transfer: Moderate assistance;+2 for physical assistance;RW (to chair)             General ADL Comments: pt is able to perform UB adls with set up.  Needs mod A +2 for sit to stand for adls.  He used a [employer] manager prior to sx.  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        Vision     Perception     Praxis      Pertinent Vitals/Pain Pain Assessment: 0-10 Pain Score: 3  Pain Location: R hip and back Pain Descriptors / Indicators: Burning Pain Intervention(s): Limited activity within patient's tolerance;Monitored during session;Premedicated before session;Repositioned;Ice applied     Hand Dominance     Extremity/Trunk Assessment Upper Extremity Assessment Upper Extremity Assessment: Generalized weakness    LLE also limited   Cervical / Trunk Assessment Cervical / Trunk Assessment:  (Pt stands in flexed position)   Communication Communication Communication: No difficulties   Cognition Arousal/Alertness: Awake/alert Behavior During Therapy: WFL for tasks assessed/performed Overall Cognitive Status: Within Functional Limits for tasks assessed                     General Comments       Exercises       Shoulder Instructions      Home Living Family/patient expects to be discharged to:: Private residence Living Arrangements: Spouse/significant other                 Bathroom Shower/Tub: Hospital doctor  Toilet: Standard         Additional Comments: did not use commode--too low (had BM in shower)      Prior Functioning/Environment Level of Independence: Independent with assistive device(s)             OT Diagnosis: Generalized weakness   OT Problem List: Decreased strength;Decreased activity tolerance;Decreased knowledge of use of DME or AE;Pain;Cardiopulmonary status limiting activity   OT Treatment/Interventions: Self-care/ADL training;DME and/or AE instruction;Therapeutic activities;Patient/family education    OT Goals(Current goals can be found in the care plan section) Acute Rehab OT Goals Patient Stated Goal: home and maximize independence with decreased pain OT Goal Formulation: With patient Time For Goal Achievement: 08/29/14 Potential to Achieve Goals: Good ADL Goals Pt Will Perform  Lower Body Bathing: with adaptive equipment;sit to/from stand;with min assist Pt Will Perform Lower Body Dressing: with mod assist;with adaptive equipment;sit to/from stand Pt Will Transfer to Toilet: ambulating;bedside commode;with min assist Pt Will Perform Toileting - Clothing Manipulation and hygiene: with min assist;sit to/from stand  OT Frequency: Min 2X/week   Barriers to D/C:            Co-evaluation PT/OT/SLP Co-Evaluation/Treatment: Yes Reason for Co-Treatment: For patient/therapist safety PT goals addressed during session: Mobility/safety with mobility OT goals addressed during session: ADL's and self-care      End of Session    Activity Tolerance:  (limited by lightheadedness.  Sitting BP 100/46) Patient left: in chair;with call bell/phone within reach   Time: 0832-0853 OT Time Calculation (min): 21 min Charges:  OT General Charges $OT Visit: 1 Procedure OT Evaluation $Initial OT Evaluation Tier I: 1 Procedure G-Codes:    Bryden Darden Sep 13, 2014, 9:11 AM  Lesle Chris, OTR/L [phone] Sep 13, 2014

## 2014-08-23 LAB — CBC
HEMATOCRIT: 29.2 % — AB (ref 39.0–52.0)
Hemoglobin: 9.5 g/dL — ABNORMAL LOW (ref 13.0–17.0)
MCH: 30.2 pg (ref 26.0–34.0)
MCHC: 32.5 g/dL (ref 30.0–36.0)
MCV: 92.7 fL (ref 78.0–100.0)
PLATELETS: 146 10*3/uL — AB (ref 150–400)
RBC: 3.15 MIL/uL — ABNORMAL LOW (ref 4.22–5.81)
RDW: 14 % (ref 11.5–15.5)
WBC: 10.4 10*3/uL (ref 4.0–10.5)

## 2014-08-23 NOTE — Progress Notes (Signed)
Physical Therapy Treatment Patient Details Name: JONAVEN CALLANAN MRN: [id number] DOB: 28-Dec-1945 Today's Date: 08/23/2014    History of Present Illness pt was admitted for R DA THA    PT Comments    Pt progressing well with ambulation but struggling with bed mobility and transfers  Follow Up Recommendations  Home health PT;SNF     Equipment Recommendations  Rolling walker with 5" wheels    Recommendations for Other Services OT consult     Precautions / Restrictions Precautions Precautions: Fall Restrictions Weight Bearing Restrictions: No    Mobility  Bed Mobility Overal bed mobility: Needs Assistance;+2 for physical assistance Bed Mobility: Supine to Sit     Supine to sit: +2 for physical assistance;HOB elevated;Mod assist     General bed mobility comments: assist for R LE and trunk  Transfers Overall transfer level: Needs assistance Equipment used: Rolling walker (2 wheeled) Transfers: Sit to/from Stand Sit to Stand: +2 physical assistance;From elevated surface;Min assist (bed set very high)         General transfer comment: assist to power up and stabilize:  Pt with ltd movement non-op leg and inability to pull under to self assist.  Pt has extremely high bed at home  Ambulation/Gait Ambulation/Gait assistance: Min assist Ambulation Distance (Feet): 200 Feet Assistive device: Rolling walker (2 wheeled) Gait Pattern/deviations: Step-to pattern;Step-through pattern;Decreased step length - right;Decreased step length - left;Shuffle;Trunk flexed Gait velocity: decr   General Gait Details: cues for posture, sequence and postion from [employer] Mobility    Modified Rankin (Stroke Patients Only)       Balance                                    Cognition Arousal/Alertness: Awake/alert Behavior During Therapy: WFL for tasks assessed/performed Overall Cognitive Status: Within Functional Limits for tasks  assessed                      Exercises Total Joint Exercises Ankle Circles/Pumps: AROM;Both;15 reps;Supine Quad Sets: AROM;Both;10 reps;Supine Heel Slides: AAROM;Right;Supine;20 reps Hip ABduction/ADduction: AAROM;Right;Supine;15 reps    General Comments        Pertinent Vitals/Pain Pain Assessment: 0-10 Pain Score: 3  Pain Location: R hip Pain Descriptors / Indicators: Aching;Sore Pain Intervention(s): Limited activity within patient's tolerance;Monitored during session;Premedicated before session;Ice applied    Home Living                      Prior Function            PT Goals (current goals can now be found in the care plan section) Acute Rehab PT Goals Patient Stated Goal: home and maximize independence with decreased pain PT Goal Formulation: With patient Time For Goal Achievement: 08/29/14 Potential to Achieve Goals: Good Progress towards PT goals: Progressing toward goals    Frequency  7X/week    PT Plan Current plan remains appropriate    Co-evaluation             End of Session Equipment Utilized During Treatment: Gait belt Activity Tolerance: Patient tolerated treatment well Patient left: in chair;with call bell/phone within reach     Time: 0927-1003 PT Time Calculation (min) (ACUTE ONLY): 36 min  Charges:  $Gait Training: 8-22 mins $Therapeutic Exercise: 8-22 mins  G Codes:      Ethridge Sollenberger 07-Sep-2014, 10:46 AM

## 2014-08-23 NOTE — Progress Notes (Signed)
   Subjective:  Patient reports pain as mild.    Objective:   VITALS:   Filed Vitals:   08/22/14 1055 08/22/14 1546 08/22/14 2255 08/23/14 0633  BP:  100/53 111/56 114/54  Pulse:  95 101 81  Temp: 100.7 F (38.2 C) 99.4 F (37.4 C) 99.9 F (37.7 C) 98.5 F (36.9 C)  TempSrc:  Oral Oral Oral  Resp:  16 14 16   Height:      Weight:      SpO2:  95% 93% 96%    Neurologically intact Neurovascular intact Sensation intact distally Intact pulses distally Dorsiflexion/Plantar flexion intact Incision: dressing C/D/I and no drainage No cellulitis present Compartment soft good quad function   Lab Results  Component Value Date   WBC 10.4 08/23/2014   HGB 9.5* 08/23/2014   HCT 29.2* 08/23/2014   MCV 92.7 08/23/2014   PLT 146* 08/23/2014     Assessment/Plan:  2 Days Post-Op   - Expected postop acute blood loss anemia - will monitor for symptoms - Up with PT/OT - DVT ppx - SCDs, ambulation, asa - WBAT right lower extremity - Pain control - Discharge planning - home Monday   Marianna Payment 08/23/2014, 9:26 AM [phone]

## 2014-08-23 NOTE — Care Management Note (Signed)
Case Management Note  Patient Details  Name: Casey Parker MRN: [id number] Date of Birth: 1945-01-31  Subjective/Objective:      Right total hip arthroplasty               Action/Plan: Home Health  Expected Discharge Date:  08/24/2014               Expected Discharge Plan:  [place]  In-House Referral:     Discharge planning Services  CM Consult  Post Acute Care Choice:    Choice offered to:     DME Arranged:  3-N-1, Walker rolling DME Agency:   [place]:  PT HH Agency:  [place]  Status of Service:  Completed, signed off  Medicare Important Message Given:    Date Medicare IM Given:    Medicare IM give by:    Date Additional Medicare IM Given:    Additional Medicare Important Message give by:     If discussed at [place] of Stay Meetings, dates discussed:    Additional Comments: Consulted for Home Health and DME. NCM spoke to pt and offered choice for [facility]. Pt requested [facility] for [facility]. Will need RW-wide and 3n1 for home. Contacted [facility] for DME for home. [facility] aware of scheduled dc home on 08/24/2014. Erenest Rasher, RN 08/23/2014, 11:25 AM

## 2014-08-23 NOTE — Progress Notes (Signed)
Occupational Therapy Treatment Patient Details Name: Casey Parker MRN: [id number] DOB: September 11, 1945 Today's Date: 08/23/2014    History of present illness pt was admitted for R DA THA   OT comments  May need SNF- concerned about ADL's and safety at home  Follow Up Recommendations  Home health OT;SNF    Equipment Recommendations  3 in 1 bedside comode    Recommendations for Other Services      Precautions / Restrictions Precautions Precautions: Fall Restrictions Weight Bearing Restrictions: No       Mobility Bed Mobility Overal bed mobility: Needs Assistance;+2 for physical assistance Bed Mobility: Supine to Sit     Supine to sit: +2 for physical assistance;HOB elevated;Mod assist     General bed mobility comments: pt in chair  Transfers Overall transfer level: Needs assistance Equipment used: Rolling walker (2 wheeled) Transfers: Sit to/from Stand Sit to Stand: +2 physical assistance;From elevated surface;Mod assist         General transfer comment: assist to power up and stabilize:  Pt with ltd movement non-op leg and inability to pull under to self assist.  Pt has extremely high bed at home    Balance                                   ADL Overall ADL's : Needs assistance/impaired                         Toilet Transfer: RW;Cueing for safety;+2 for physical assistance;Maximal assistance Toilet Transfer Details (indicate cue type and reason): sit to stand to simulate urinal Toileting- Clothing Manipulation and Hygiene: Maximal assistance;Sit to/from stand;+2 for physical assistance         General ADL Comments: discussed possiible need for SNF. Pt states we will discuss next day      Vision                     Perception     Praxis      Cognition   Behavior During Therapy: [facility] for tasks assessed/performed Overall Cognitive Status: Within Functional Limits for tasks assessed                        Extremity/Trunk Assessment               Exercises Total Joint Exercises Ankle Circles/Pumps: AROM;Both;15 reps;Supine Quad Sets: AROM;Both;10 reps;Supine Heel Slides: AAROM;Right;Supine;20 reps Hip ABduction/ADduction: AAROM;Right;Supine;15 reps   Shoulder Instructions       General Comments      Pertinent Vitals/ Pain       Pain Assessment: 0-10 Pain Score: 4  Pain Location: r hip Pain Descriptors / Indicators: Aching;Sore Pain Intervention(s): Monitored during session;Ice applied  Home Living                                          Prior Functioning/Environment              Frequency Min 2X/week     Progress Toward Goals  OT Goals(current goals can now be found in the care plan section)  Progress towards OT goals: Progressing toward goals  Acute Rehab OT Goals Patient Stated Goal: home and maximize independence with decreased pain  Plan Discharge plan remains appropriate  Co-evaluation                 End of Session     Activity Tolerance Patient tolerated treatment well   Patient Left in chair;with call bell/phone within reach   Nurse Communication          Time: [id number] OT Time Calculation (min): 25 min  Charges: OT Evaluation $Initial OT Evaluation Tier I: 1 Procedure OT Treatments $Self Care/Home Management : 23-37 mins  Idania Desouza, Thereasa Parkin 08/23/2014, 12:09 PM

## 2014-08-23 NOTE — Progress Notes (Signed)
Physical Therapy Treatment Patient Details Name: SENAD URBEN MRN: [id number] DOB: 03/31/1945 Today's Date: 08/23/2014    History of Present Illness pt was admitted for R DA THA    PT Comments    Pt progressing well with ambulation but struggling with transfers - requiring significant assist of to 2.  Pt would benefit from follow up rehab at SNF level to maximize IND and safety prior to return home with assist of wife only.  Follow Up Recommendations  SNF     Equipment Recommendations  Rolling walker with 5" wheels    Recommendations for Other Services OT consult     Precautions / Restrictions Precautions Precautions: Fall Restrictions Weight Bearing Restrictions: No    Mobility  Bed Mobility Overal bed mobility: Needs Assistance;+2 for physical assistance Bed Mobility: Sit to Supine       Sit to supine: +2 for physical assistance;Mod assist   General bed mobility comments: cues for sequence with physical assist to manage Bilat LEs and trunk  Transfers Overall transfer level: Needs assistance Equipment used: Rolling walker (2 wheeled) Transfers: Sit to/from Stand Sit to Stand: +2 physical assistance;From elevated surface;Mod assist         General transfer comment: assist to power up and stabilize:  Pt with ltd ROM non-op leg and inability to pull under to self assist.    Ambulation/Gait Ambulation/Gait assistance: Min assist Ambulation Distance (Feet): 169 Feet Assistive device: Rolling walker (2 wheeled) Gait Pattern/deviations: Step-to pattern;Step-through pattern;Decreased step length - right;Decreased step length - left;Shuffle;Trunk flexed Gait velocity: decr   General Gait Details: cues for posture and postion from [employer]            Wheelchair Mobility    Modified Rankin (Stroke Patients Only)       Balance                                    Cognition Arousal/Alertness: Awake/alert Behavior During Therapy: WFL  for tasks assessed/performed Overall Cognitive Status: Within Functional Limits for tasks assessed                      Exercises      General Comments        Pertinent Vitals/Pain Pain Assessment: 0-10 Pain Score: 3  Pain Location: R hip Pain Descriptors / Indicators: Aching;Sore Pain Intervention(s): Limited activity within patient's tolerance;Monitored during session;Ice applied    Home Living                      Prior Function            PT Goals (current goals can now be found in the care plan section) Acute Rehab PT Goals Patient Stated Goal: home and maximize independence with decreased pain PT Goal Formulation: With patient Time For Goal Achievement: 08/29/14 Potential to Achieve Goals: Fair Progress towards PT goals: Progressing toward goals    Frequency  7X/week    PT Plan Discharge plan needs to be updated    Co-evaluation             End of Session Equipment Utilized During Treatment: Gait belt Activity Tolerance: Patient tolerated treatment well Patient left: in bed;with call bell/phone within reach;with family/visitor present     Time: [id number] PT Time Calculation (min) (ACUTE ONLY): 27 min  Charges:  $Gait Training: 8-22 mins $Therapeutic Activity: 8-22 mins  G Codes:      Ayane Delancey 09-22-2014, 2:19 PM

## 2014-08-24 ENCOUNTER — Encounter (HOSPITAL_COMMUNITY): Payer: Self-pay | Admitting: Orthopaedic Surgery

## 2014-08-24 DIAGNOSIS — G43909 Migraine, unspecified, not intractable, without status migrainosus: Secondary | ICD-10-CM | POA: Diagnosis not present

## 2014-08-24 DIAGNOSIS — Z96641 Presence of right artificial hip joint: Secondary | ICD-10-CM | POA: Diagnosis not present

## 2014-08-24 DIAGNOSIS — E785 Hyperlipidemia, unspecified: Secondary | ICD-10-CM | POA: Diagnosis not present

## 2014-08-24 DIAGNOSIS — M159 Polyosteoarthritis, unspecified: Secondary | ICD-10-CM | POA: Diagnosis not present

## 2014-08-24 DIAGNOSIS — Z471 Aftercare following joint replacement surgery: Secondary | ICD-10-CM | POA: Diagnosis not present

## 2014-08-24 DIAGNOSIS — E039 Hypothyroidism, unspecified: Secondary | ICD-10-CM | POA: Diagnosis not present

## 2014-08-24 DIAGNOSIS — M1611 Unilateral primary osteoarthritis, right hip: Secondary | ICD-10-CM | POA: Diagnosis not present

## 2014-08-24 DIAGNOSIS — R278 Other lack of coordination: Secondary | ICD-10-CM | POA: Diagnosis not present

## 2014-08-24 DIAGNOSIS — R2681 Unsteadiness on feet: Secondary | ICD-10-CM | POA: Diagnosis not present

## 2014-08-24 DIAGNOSIS — E669 Obesity, unspecified: Secondary | ICD-10-CM | POA: Diagnosis not present

## 2014-08-24 DIAGNOSIS — K5900 Constipation, unspecified: Secondary | ICD-10-CM | POA: Diagnosis not present

## 2014-08-24 DIAGNOSIS — Z96649 Presence of unspecified artificial hip joint: Secondary | ICD-10-CM | POA: Diagnosis not present

## 2014-08-24 DIAGNOSIS — I10 Essential (primary) hypertension: Secondary | ICD-10-CM | POA: Diagnosis not present

## 2014-08-24 DIAGNOSIS — M1990 Unspecified osteoarthritis, unspecified site: Secondary | ICD-10-CM | POA: Diagnosis not present

## 2014-08-24 DIAGNOSIS — N390 Urinary tract infection, site not specified: Secondary | ICD-10-CM | POA: Diagnosis not present

## 2014-08-24 DIAGNOSIS — M6281 Muscle weakness (generalized): Secondary | ICD-10-CM | POA: Diagnosis not present

## 2014-08-24 DIAGNOSIS — D62 Acute posthemorrhagic anemia: Secondary | ICD-10-CM | POA: Diagnosis not present

## 2014-08-24 LAB — CBC
HEMATOCRIT: 25.7 % — AB (ref 39.0–52.0)
Hemoglobin: 8.5 g/dL — ABNORMAL LOW (ref 13.0–17.0)
MCH: 30.5 pg (ref 26.0–34.0)
MCHC: 33.1 g/dL (ref 30.0–36.0)
MCV: 92.1 fL (ref 78.0–100.0)
Platelets: 147 10*3/uL — ABNORMAL LOW (ref 150–400)
RBC: 2.79 MIL/uL — ABNORMAL LOW (ref 4.22–5.81)
RDW: 13.8 % (ref 11.5–15.5)
WBC: 10 10*3/uL (ref 4.0–10.5)

## 2014-08-24 MED ORDER — OXYCODONE-ACETAMINOPHEN 5-325 MG PO TABS: 1.0000 | ORAL_TABLET | ORAL | Status: DC | PRN

## 2014-08-24 MED ORDER — ASPIRIN 325 MG PO TABS: 325.0000 mg | ORAL_TABLET | Freq: Two times a day (BID) | ORAL | Status: DC

## 2014-08-24 MED ORDER — METHOCARBAMOL 500 MG PO TABS: 500.0000 mg | ORAL_TABLET | Freq: Four times a day (QID) | ORAL | Status: DC | PRN

## 2014-08-24 NOTE — Care Management Important Message (Signed)
Important Message  Patient Details  Name: Casey Parker MRN: [id number] Date of Birth: 1945-11-14   Medicare Important Message Given:  Yes-second notification given    Camillo Flaming 08/24/2014, 1:49 [facility] Message  Patient Details  Name: Casey Parker MRN: [id number] Date of Birth: September 07, 1945   Medicare Important Message Given:  Yes-second notification given    Camillo Flaming 08/24/2014, 1:49 PM

## 2014-08-24 NOTE — Progress Notes (Signed)
Pt for discharge to [facility].   CSW facilitated pt discharge needs including contacting facility, faxing pt discharge information via [facility], discussing with pt and pt wife at bedside, providing RN phone number to call report, and arranging ambulance transport for pt to [facility].   Pt and pt wife questions clarified at bedside.  No further social work needs identified at this time.  CSW signing off.   Alison Murray, MSW, [place] [facility] [phone]

## 2014-08-24 NOTE — Progress Notes (Signed)
Subjective: 3 Days Post-Op Procedure(s) (LRB): RIGHT TOTAL HIP ARTHROPLASTY ANTERIOR APPROACH (Right) Patient reports pain as moderate.  Slow progress with therapy who have recommended short-term SNF.  Acute blood loss anemia, but compensating well.  Objective: Vital signs in last 24 hours: Temp:  [98.8 F (37.1 C)-102.3 F (39.1 C)] 98.8 F (37.1 C) (07/25 0523) Pulse Rate:  [66-106] 66 (07/25 0523) Resp:  [16] 16 (07/25 0523) BP: (92-135)/(44-107) 93/49 mmHg (07/25 0523) SpO2:  [94 %-98 %] 97 % (07/25 0523)  Intake/Output from previous day: 07/24 0701 - 07/25 0700 In: 800 [P.O.:800] Out: 1650 [Urine:1650] Intake/Output this shift:     Recent Labs  08/22/14 0446 08/23/14 0538 08/24/14 0424  HGB 9.7* 9.5* 8.5*    Recent Labs  08/23/14 0538 08/24/14 0424  WBC 10.4 10.0  RBC 3.15* 2.79*  HCT 29.2* 25.7*  PLT 146* 147*    Recent Labs  08/22/14 0446  NA 133*  K 4.4  CL 100*  CO2 29  BUN 10  CREATININE 0.93  GLUCOSE 123*  CALCIUM 8.2*   No results for input(s): LABPT, INR in the last 72 hours.  Sensation intact distally Intact pulses distally Dorsiflexion/Plantar flexion intact Incision: scant drainage No cellulitis present Compartment soft  Assessment/Plan: 3 Days Post-Op Procedure(s) (LRB): RIGHT TOTAL HIP ARTHROPLASTY ANTERIOR APPROACH (Right) Up with therapy Discharge to SNF  [facility] 08/24/2014, 7:25 AM

## 2014-08-24 NOTE — Care Management Note (Signed)
Case Management Note  Patient Details  Name: Casey Parker MRN: [id number] Date of Birth: 02-27-1945  Subjective/Objective:  D/c plan snf. ahc dme rep [place] notified since ordered for rw,3n1.                  Action/Plan:d/c SNF.   Expected Discharge Date:                 Expected Discharge Plan:  Skilled Nursing Facility  In-House Referral:  Clinical Social Work  Discharge planning Services  CM Consult  Post Acute Care Choice:    Choice offered to:     DME Arranged:    DME Agency:     HH Arranged:    HH Agency:  [facility]  Status of Service:  Completed, signed off  Medicare Important Message Given:    Date Medicare IM Given:    Medicare IM give by:    Date Additional Medicare IM Given:    Additional Medicare Important Message give by:     If discussed at [address] of Stay Meetings, dates discussed:    Additional Comments:  Dessa Phi, RN 08/24/2014, 11:56 AM

## 2014-08-24 NOTE — Clinical Social Work Placement (Signed)
   CLINICAL SOCIAL WORK PLACEMENT  NOTE  Date:  08/24/2014  Patient Details  Name: Casey Parker MRN: [id number] Date of Birth: May 30, 1945  Clinical Social Work is seeking post-discharge placement for this patient at the [place] level of care (*CSW will initial, date and re-position this form in  chart as items are completed):  Yes   Patient/family provided with [place] Department's list of facilities offering this level of care within the geographic area requested by the patient (or if unable, by the patient's family).  Yes   Patient/family informed of their freedom to choose among providers that offer the needed level of care, that participate in Medicare, Medicaid or managed care program needed by the patient, have an available bed and are willing to accept the patient.  Yes   Patient/family informed of [facility]'s ownership interest in [facility] and [facility], as well as of the fact that they are under no obligation to receive care at these facilities.  PASRR submitted to EDS on 08/24/14     PASRR number received on 08/24/14     Existing PASRR number confirmed on       FL2 transmitted to all facilities in geographic area requested by pt/family on 08/24/14     FL2 transmitted to all facilities within larger geographic area on       Patient informed that his/her managed care company has contracts with or will negotiate with certain facilities, including the following:        Yes   Patient/family informed of bed offers received.  Patient chooses bed at [facility]     Physician recommends and patient chooses bed at      Patient to be transferred to [facility] on 08/24/14.  Patient to be transferred to facility by ambulance [name])     Patient family notified on 08/24/14 of transfer.  Name of family member notified:  pt notified at bedside     PHYSICIAN Please sign FL2     Additional Comment:     _______________________________________________ Ladell Pier, LCSW 08/24/2014, 11:22 AM

## 2014-08-24 NOTE — Progress Notes (Signed)
Occupational Therapy Treatment Patient Details Name: Casey Parker MRN: [id number] DOB: Apr 26, 1945 Today's Date: 08/24/2014    History of present illness pt was admitted for R DA THA   OT comments  Now agreeable to SNF  Follow Up Recommendations  SNF    Equipment Recommendations       Recommendations for Other Services      Precautions / Restrictions Precautions Precautions: Fall Restrictions Weight Bearing Restrictions: No       Mobility Bed Mobility   Bed Mobility: Supine to Sit     Supine to sit: Mod assist        Transfers Overall transfer level: Needs assistance Equipment used: Rolling walker (2 wheeled);1 person hand held assist Transfers: Sit to/from Stand Sit to Stand: Mod assist;From elevated surface         General transfer comment: much improved this day        ADL   Eating/Feeding: Set up;Sitting   Grooming: Set up;Sitting               Lower Body Dressing: Maximal assistance;Sit to/from stand;Cueing for sequencing;Cueing for safety;With adaptive equipment   Toilet Transfer: Surveyor, mining Details (indicate cue type and reason): sit to stand from bed for urinal           General ADL Comments: pt now agrees to sNF                Cognition   Behavior During Therapy: [facility] for tasks assessed/performed Overall Cognitive Status: Within Functional Limits for tasks assessed                       Extremity/Trunk Assessment                     General Comments  educated on AE    Pertinent Vitals/ Pain       Pain Score: 4  Pain Location: R thigh Pain Descriptors / Indicators: Sore Pain Intervention(s): Repositioned;Limited activity within patient's tolerance  Home Living                                          Prior Functioning/Environment              Frequency Min 2X/week     Progress Toward Goals  OT Goals(current goals can now be found in the care plan section)  Progress towards OT goals: Progressing toward goals     Plan Discharge plan needs to be updated    Co-evaluation                 End of Session Equipment Utilized During Treatment: Rolling walker   Activity Tolerance Patient tolerated treatment well   Patient Left in chair   Nurse Communication Mobility status        Time: [id number] OT Time Calculation (min): 32 min  Charges: OT General Charges $OT Visit: 1 Procedure OT Treatments $Self Care/Home Management : 23-37 mins  Lakynn Halvorsen D 08/24/2014, 11:14 AM

## 2014-08-24 NOTE — Discharge Summary (Signed)
Patient ID: Casey Parker MRN: [id number] DOB/AGE: Feb 13, 1945 69 y.o.  Admit date: 08/21/2014 Discharge date: 08/24/2014  Admission Diagnoses:  Principal Problem:   Osteoarthritis of right hip Active Problems:   Status post total replacement of right hip   Discharge Diagnoses:  Same  Past Medical History  Diagnosis Date  . Hyperlipidemia   . Obesity   . Hypertension   . Osteoarthritis   . Migraines     H/O  . Thyroid disease     hypo  . Cellulitis     recurrent  . Transient ischemic attack (TIA)     presumed  . Diastolic dysfunction   . Left ventricular hypertrophy   . DVT (deep venous thrombosis)     20 yrs ago  . Stroke     11-2008 no residual problems  . Enlarged prostate     Surgeries: Procedure(s): RIGHT TOTAL HIP ARTHROPLASTY ANTERIOR APPROACH on 08/21/2014   Consultants:  PT  Discharged Condition: Improved  Hospital Course: Casey Parker is an 69 y.o. male who was admitted 08/21/2014 for operative treatment ofOsteoarthritis of right hip. Patient has severe unremitting pain that affects sleep, daily activities, and work/hobbies. After pre-op clearance the patient was taken to the operating room on 08/21/2014 and underwent  Procedure(s): RIGHT TOTAL HIP ARTHROPLASTY ANTERIOR APPROACH.    Patient was given perioperative antibiotics: Anti-infectives    Start     Dose/Rate Route Frequency Ordered Stop   08/22/14 1000  sulfamethoxazole-trimethoprim (BACTRIM DS,SEPTRA DS) 800-160 MG per tablet 1 tablet     1 tablet Oral Daily 08/21/14 1545     08/21/14 1800  clindamycin (CLEOCIN) IVPB 600 mg     600 mg 100 mL/hr over 30 Minutes Intravenous Every 6 hours 08/21/14 1545 08/21/14 2335   08/21/14 0928  clindamycin (CLEOCIN) IVPB 900 mg     900 mg 100 mL/hr over 30 Minutes Intravenous On call to O.R. 08/21/14 [id number] 08/21/14 1242       Patient was given sequential compression devices, early ambulation, and chemoprophylaxis to prevent DVT.  Patient benefited  maximally from hospital stay and there were no complications.    Recent vital signs: Patient Vitals for the past 24 hrs:  BP Temp Temp src Pulse Resp SpO2  08/24/14 0523 (!) 93/49 mmHg 98.8 F (37.1 C) Oral 66 16 97 %  08/23/14 2301 (!) 92/44 mmHg 99.4 F (37.4 C) Oral 85 16 96 %  08/23/14 2138 (!) 135/107 mmHg (!) 102.3 F (39.1 C) Oral 92 16 98 %  08/23/14 1522 - 99.3 F (37.4 C) Oral - - -  08/23/14 1445 (!) 114/56 mmHg (!) 101.1 F (38.4 C) Oral (!) 106 16 94 %     Recent laboratory studies:  Recent Labs  08/22/14 0446 08/23/14 0538 08/24/14 0424  WBC 7.3 10.4 10.0  HGB 9.7* 9.5* 8.5*  HCT 30.2* 29.2* 25.7*  PLT 168 146* 147*  NA 133*  --   --   K 4.4  --   --   CL 100*  --   --   CO2 29  --   --   BUN 10  --   --   CREATININE 0.93  --   --   GLUCOSE 123*  --   --   CALCIUM 8.2*  --   --      Discharge Medications:     Medication List    TAKE these medications        aspirin 325 MG tablet  Take 1 tablet (325 mg total) by mouth 2 (two) times daily after a meal.     FISH OIL PO  Take 1,600 Units by mouth daily.     levothyroxine 125 MCG tablet  Commonly known as:  SYNTHROID, LEVOTHROID  Take 125 mcg by mouth daily.     losartan 100 MG tablet  Commonly known as:  COZAAR  Take 100 mg by mouth daily.     methocarbamol 500 MG tablet  Commonly known as:  ROBAXIN  Take 1 tablet (500 mg total) by mouth every 6 (six) hours as needed for muscle spasms.     multivitamin tablet  Take 1 tablet by mouth daily.     oxyCODONE-acetaminophen 5-325 MG per tablet  Commonly known as:  ROXICET  Take 1-2 tablets by mouth every 4 (four) hours as needed.     psyllium 58.6 % powder  Commonly known as:  METAMUCIL  Take 1 packet by mouth daily.     simvastatin 20 MG tablet  Commonly known as:  ZOCOR  Take 20 mg by mouth daily.     SULFAMETHOXAZOLE-TMP DS PO  Take 1 tablet by mouth daily.        Diagnostic Studies: Dg C-arm 1-60 Min-no Report  08/21/2014    CLINICAL DATA: hip   C-ARM 1-60 MINUTES  Fluoroscopy was utilized by the requesting physician.  No radiographic  interpretation.    Dg Hip Port Unilat With Pelvis 1v Right  08/21/2014   CLINICAL DATA:  Status post right total hip replacement.  EXAM: DG HIP (WITH OR WITHOUT PELVIS) 1V PORT RIGHT  COMPARISON:  August 21, 2014.  FINDINGS: The femoral and acetabular components appear to be well situated. Expected amount of soft tissue gas is seen lateral to the hip. Surgical staples are noted. No fracture or dislocation is noted.  IMPRESSION: Status post right total hip arthroplasty.   Electronically Signed   By: Marijo Conception, M.D.   On: 08/21/2014 15:24   Dg Hip Port Unilat With Pelvis 1v Right  08/21/2014   CLINICAL DATA:  Status post right total hip replacement.  EXAM: DG C-ARM 1-60 MIN - NRPT [facility]; DG HIP  1V PORT RIGHT  COMPARISON:  None.  FLUOROSCOPY TIME:  Fluoroscopy Time:  0 minutes 43 seconds  Number of Acquired Images:  3  FINDINGS: There is a total hip replacement on the right with prosthetic components appearing well-seated. No acute fracture or dislocation. No bony destruction appreciable  IMPRESSION: Prosthetic components appear well seated. No acute fracture or dislocation.   Electronically Signed   By: Lowella Grip III M.D.   On: 08/21/2014 14:17    Disposition: SNF      Discharge Instructions    Discharge patient    Complete by:  As directed            Follow-up Information    Follow up with Mcarthur Rossetti, MD In 2 weeks.   Specialty:  Orthopedic Surgery   Contact information:   [place] [address] [phone]       Follow up with [facility]   Why:  Home Health Physical Therapy   Contact information:   [place] [address] [place] [address] [phone]       Follow up with Mcarthur Rossetti, MD In 2 weeks.   Specialty:  Orthopedic Surgery   Contact information:   [place]  [address] [phone]  SignedErskine Emery 08/24/2014, 12:01 PM   Discharge

## 2014-08-24 NOTE — Discharge Summary (Signed)
Patient ID: ADAIR STOTLER MRN: [id number] DOB/AGE: [birth year] 69 y.o.  Admit date: 08/21/2014 Discharge date: 08/24/2014  Admission Diagnoses:  Principal Problem:   Osteoarthritis of right hip Active Problems:   Status post total replacement of right hip   Discharge Diagnoses:  Same  Past Medical History  Diagnosis Date  . Hyperlipidemia   . Obesity   . Hypertension   . Osteoarthritis   . Migraines     H/O  . Thyroid disease     hypo  . Cellulitis     recurrent  . Transient ischemic attack (TIA)     presumed  . Diastolic dysfunction   . Left ventricular hypertrophy   . DVT (deep venous thrombosis)     20 yrs ago  . Stroke     11-2008 no residual problems  . Enlarged prostate     Surgeries: Procedure(s): RIGHT TOTAL HIP ARTHROPLASTY ANTERIOR APPROACH on 08/21/2014   Consultants:    Discharged Condition: Improved  Hospital Course: AUDI HARLIN is an 69 y.o. male who was admitted 08/21/2014 for operative treatment ofOsteoarthritis of right hip. Patient has severe unremitting pain that affects sleep, daily activities, and work/hobbies. After pre-op clearance the patient was taken to the operating room on 08/21/2014 and underwent  Procedure(s): RIGHT TOTAL HIP ARTHROPLASTY ANTERIOR APPROACH.    Patient was given perioperative antibiotics: Anti-infectives    Start     Dose/Rate Route Frequency Ordered Stop   08/22/14 1000  sulfamethoxazole-trimethoprim (BACTRIM DS,SEPTRA DS) 800-160 MG per tablet 1 tablet     1 tablet Oral Daily 08/21/14 1545     08/21/14 1800  clindamycin (CLEOCIN) IVPB 600 mg     600 mg 100 mL/hr over 30 Minutes Intravenous Every 6 hours 08/21/14 1545 08/21/14 2335   08/21/14 0928  clindamycin (CLEOCIN) IVPB 900 mg     900 mg 100 mL/hr over 30 Minutes Intravenous On call to O.R. 08/21/14 [id number] 08/21/14 1242       Patient was given sequential compression devices, early ambulation, and chemoprophylaxis to prevent DVT.  Patient benefited  maximally from hospital stay and there were no complications.    Recent vital signs: Patient Vitals for the past 24 hrs:  BP Temp Temp src Pulse Resp SpO2  08/24/14 0523 (!) 93/49 mmHg 98.8 F (37.1 C) Oral 66 16 97 %  08/23/14 2301 (!) 92/44 mmHg 99.4 F (37.4 C) Oral 85 16 96 %  08/23/14 2138 (!) 135/107 mmHg (!) 102.3 F (39.1 C) Oral 92 16 98 %  08/23/14 1522 - 99.3 F (37.4 C) Oral - - -  08/23/14 1445 (!) 114/56 mmHg (!) 101.1 F (38.4 C) Oral (!) 106 16 94 %     Recent laboratory studies:  Recent Labs  08/22/14 0446 08/23/14 0538 08/24/14 0424  WBC 7.3 10.4 10.0  HGB 9.7* 9.5* 8.5*  HCT 30.2* 29.2* 25.7*  PLT 168 146* 147*  NA 133*  --   --   K 4.4  --   --   CL 100*  --   --   CO2 29  --   --   BUN 10  --   --   CREATININE 0.93  --   --   GLUCOSE 123*  --   --   CALCIUM 8.2*  --   --      Discharge Medications:     Medication List    ASK your doctor about these medications        aspirin 325  MG tablet  Take 325 mg by mouth daily.     FISH OIL PO  Take 1,600 Units by mouth daily.     levothyroxine 125 MCG tablet  Commonly known as:  SYNTHROID, LEVOTHROID  Take 125 mcg by mouth daily.     losartan 100 MG tablet  Commonly known as:  COZAAR  Take 100 mg by mouth daily.     multivitamin tablet  Take 1 tablet by mouth daily.     psyllium 58.6 % powder  Commonly known as:  METAMUCIL  Take 1 packet by mouth daily.     simvastatin 20 MG tablet  Commonly known as:  ZOCOR  Take 20 mg by mouth daily.     SULFAMETHOXAZOLE-TMP DS PO  Take 1 tablet by mouth daily.        Diagnostic Studies: Dg C-arm 1-60 Min-no Report  08/21/2014   CLINICAL DATA: hip   C-ARM 1-60 MINUTES  Fluoroscopy was utilized by the requesting physician.  No radiographic  interpretation.    Dg Hip Port Unilat With Pelvis 1v Right  08/21/2014   CLINICAL DATA:  Status post right total hip replacement.  EXAM: DG HIP (WITH OR WITHOUT PELVIS) 1V PORT RIGHT  COMPARISON:  August 21, 2014.  FINDINGS: The femoral and acetabular components appear to be well situated. Expected amount of soft tissue gas is seen lateral to the hip. Surgical staples are noted. No fracture or dislocation is noted.  IMPRESSION: Status post right total hip arthroplasty.   Electronically Signed   By: Marijo Conception, M.D.   On: 08/21/2014 15:24   Dg Hip Port Unilat With Pelvis 1v Right  08/21/2014   CLINICAL DATA:  Status post right total hip replacement.  EXAM: DG C-ARM 1-60 MIN - NRPT [facility]; DG HIP  1V PORT RIGHT  COMPARISON:  None.  FLUOROSCOPY TIME:  Fluoroscopy Time:  0 minutes 43 seconds  Number of Acquired Images:  3  FINDINGS: There is a total hip replacement on the right with prosthetic components appearing well-seated. No acute fracture or dislocation. No bony destruction appreciable  IMPRESSION: Prosthetic components appear well seated. No acute fracture or dislocation.   Electronically Signed   By: Lowella Grip III M.D.   On: 08/21/2014 14:17    Disposition:   To skilled nursing facility       Follow-up Information    Follow up with Mcarthur Rossetti, MD In 2 weeks.   Specialty:  Orthopedic Surgery   Contact information:   [place] [address] [phone]       Follow up with [facility].   Why:  Home Health Physical Therapy   Contact information:   [place] [address] [place] [address] [phone]       Follow up with Mcarthur Rossetti, MD In 2 weeks.   Specialty:  Orthopedic Surgery   Contact information:   [place] [address] [phone]        Signed: Mcarthur Rossetti 08/24/2014, 7:26 AM   !

## 2014-08-24 NOTE — Clinical Social Work Note (Signed)
Clinical Social Work Assessment  Patient Details  Name: Casey Parker MRN: [id number] Date of Birth: 05/01/1945  Date of referral:  08/24/14               Reason for consult:  Discharge Planning                Permission sought to share information with:  Family Supports Permission granted to share information::  No  Name::        Agency::     Relationship::     Contact Information:     Housing/Transportation Living arrangements for the past 2 months:  Single Family Home Source of Information:  Patient Patient Interpreter Needed:  None Criminal Activity/Legal Involvement Pertinent to Current Situation/Hospitalization:  No - Comment as needed Significant Relationships:  Spouse Lives with:  Spouse Do you feel safe going back to the place where you live?  No Need for family participation in patient care:     Care giving concerns:  Pt progressing slowly with PT and concern about pt ability to care for himself at home. Recommendation is now SNF.   Social Worker assessment / plan:  CSW received referral for new SNF. Pt medically ready for discharge today.  CSW met with pt at bedside. OT present at this time. CSW introduced self and explained role. Pt states that he was expecting CSW visit. Pt reports that he has not progressed as he had hoped and needing rehab prior to returning home. Pt stated that he has heard good things about [facility] and prefers to go to [facility] for rehab. Pt agreeable to [facility] search to have secondary options for if any reason [facility] is unable to accept pt.   CSW completed FL2 and initiated SNF search to [facility]. CSW contacted [place] and facility reviewed information and confirmed that [facility] can accept pt today.   CSW spoke with pt at bedside regarding bed offer from [facility] and pt accepting. CSW notified [facility].  CSW to facilitate pt discharge needs this afternoon.  Employment status:   Aeronautical engineer:  Medicare PT Recommendations:  [place] / Referral to community resources:  [place]  Patient/Family's Response to care:  Pt alert and oriented x 4. Pt open to rehab as he is concerned that he is not yet strong enough to manage at home. Pt pleased that [facility] can accept him today.  Patient/Family's Understanding of and Emotional Response to Diagnosis, Current Treatment, and Prognosis:  Pt displayed knowledge and understanding about diagnosis and treatment plan and appreciative that rehab could be arranged.   Emotional Assessment Appearance:  Appears stated age Attitude/Demeanor/Rapport:  Other (pt appropriate) Affect (typically observed):  Adaptable Orientation:  Oriented to Self, Oriented to Place, Oriented to  Time, Oriented to Situation Alcohol / Substance use:  Not Applicable Psych involvement (Current and /or in the community):  No (Comment)  Discharge Needs  Concerns to be addressed:  Discharge Planning Concerns Readmission within the last 30 days:  No Current discharge risk:  None Barriers to Discharge:  No Barriers Identified   [place], [place], LCSW 08/24/2014, 11:11 AM  [phone]

## 2014-08-24 NOTE — Progress Notes (Signed)
Temp 102.3 bp 135/107 per dynamap.Pt had just been turned and repositioned sise to side several times in bed.pt states since he had dvt lle in past that whenever he is moved around and left leg moved that he will run a fever and show flu like symptoms.Casey Parker D

## 2014-08-25 ENCOUNTER — Encounter: Payer: Self-pay | Admitting: Adult Health

## 2014-08-25 ENCOUNTER — Non-Acute Institutional Stay (SKILLED_NURSING_FACILITY): Payer: Medicare Other | Admitting: Adult Health

## 2014-08-25 DIAGNOSIS — E039 Hypothyroidism, unspecified: Secondary | ICD-10-CM | POA: Diagnosis not present

## 2014-08-25 DIAGNOSIS — K5900 Constipation, unspecified: Secondary | ICD-10-CM

## 2014-08-25 DIAGNOSIS — E785 Hyperlipidemia, unspecified: Secondary | ICD-10-CM

## 2014-08-25 DIAGNOSIS — N390 Urinary tract infection, site not specified: Secondary | ICD-10-CM | POA: Diagnosis not present

## 2014-08-25 DIAGNOSIS — Z96641 Presence of right artificial hip joint: Secondary | ICD-10-CM

## 2014-08-25 DIAGNOSIS — M1611 Unilateral primary osteoarthritis, right hip: Secondary | ICD-10-CM

## 2014-08-25 DIAGNOSIS — D62 Acute posthemorrhagic anemia: Secondary | ICD-10-CM | POA: Diagnosis not present

## 2014-08-25 DIAGNOSIS — Z96649 Presence of unspecified artificial hip joint: Secondary | ICD-10-CM

## 2014-08-25 DIAGNOSIS — I10 Essential (primary) hypertension: Secondary | ICD-10-CM

## 2014-08-27 NOTE — Progress Notes (Signed)
Patient ID: Casey Parker, male   DOB: 02-27-1945, 69 y.o.   MRN: [id number]    DATE: 08/25/14 MRN:  [id number]  BIRTHDAY: 06-28-1945  Facility:  Nursing Home Location:  [place] Room Number: 703-P  LEVEL OF CARE:  SNF (31)  Contact Information    Name Relation Home Work [place] [phone]    No name specified          Chief Complaint  Patient presents with  . Hospitalization Follow-up    Osteoarthritis S/P right total hip arthroplasty, hypertension, hypothyroidism, constipation, hyperlipidemia, frequent UTI and anemia    HISTORY OF PRESENT ILLNESS:  This is a 69 year old male who was been admitted to [facility] on 08/24/14 from [facility] with osteoarthritis S/P right total hip replacement, anterior approach 7/22. He has PMH of hyperlipidemia, obesity, hypertension, osteoarthritis, migraines, TIA, diastolic dysfunction, DVT (LLE, 20 years ago), stroke, recurrent cellulitis and enlarged prostate. He has been admitted for a short-term rehabilitation.  PAST MEDICAL HISTORY:  Past Medical History  Diagnosis Date  . Hyperlipidemia   . Obesity   . Hypertension   . Osteoarthritis   . Migraines     H/O  . Thyroid disease     hypo  . Cellulitis     recurrent  . Transient ischemic attack (TIA)     presumed  . Diastolic dysfunction   . Left ventricular hypertrophy   . DVT (deep venous thrombosis)     20 yrs ago  . Stroke     11-2008 no residual problems  . Enlarged prostate      CURRENT MEDICATIONS: Reviewed  Patient's Medications  New Prescriptions   No medications on file  Previous Medications   ASPIRIN 325 MG TABLET    Take 1 tablet (325 mg total) by mouth 2 (two) times daily after a meal.   LEVOTHYROXINE (SYNTHROID, LEVOTHROID) 125 MCG TABLET    Take 125 mcg by mouth daily.   LOSARTAN (COZAAR) 100 MG TABLET    Take 100 mg by mouth daily.   METHOCARBAMOL (ROBAXIN) 500 MG TABLET    Take  1 tablet (500 mg total) by mouth every 6 (six) hours as needed for muscle spasms.   MULTIPLE VITAMIN (MULTIVITAMIN) TABLET    Take 1 tablet by mouth daily.   OMEGA-3 FATTY ACIDS (FISH OIL PO)    Take 1,600 Units by mouth daily.   OXYCODONE-ACETAMINOPHEN (ROXICET) 5-325 MG PER TABLET    Take 1-2 tablets by mouth every 4 (four) hours as needed.   PSYLLIUM (METAMUCIL) 58.6 % POWDER    Take 1 packet by mouth daily.   SIMVASTATIN (ZOCOR) 20 MG TABLET    Take 20 mg by mouth daily.    SULFAMETHOXAZOLE-TRIMETHOPRIM (SULFAMETHOXAZOLE-TMP DS PO)    Take 1 tablet by mouth daily.  Modified Medications   No medications on file  Discontinued Medications   No medications on file     Allergies  Allergen Reactions  . Augmentin [Amoxicillin-Pot Clavulanate]   . Other     bovine products -itching  . Penicillins     Pt unsure if allergic to penicillin     REVIEW OF SYSTEMS:  GENERAL: no change in appetite, no fatigue, no weight changes, no fever, chills or weakness EYES: Denies change in vision, dry eyes, eye pain, itching or discharge EARS: Denies change in hearing, ringing in ears, or earache NOSE: Denies nasal congestion or epistaxis MOUTH and THROAT:  Denies oral discomfort, gingival pain or bleeding, pain from teeth or hoarseness   RESPIRATORY: no cough, SOB, DOE, wheezing, hemoptysis CARDIAC: no chest pain, edema or palpitations GI: no abdominal pain, diarrhea, constipation, heart burn, nausea or vomiting GU: Denies dysuria, frequency, hematuria, incontinence, or discharge PSYCHIATRIC: Denies feeling of depression or anxiety. No report of hallucinations, insomnia, paranoia, or agitation   PHYSICAL EXAMINATION  GENERAL APPEARANCE: Well nourished. In no acute distress. Normal body habitus SKIN:  Right hip surgical incision is dry, no redness HEAD: Normal in size and contour. No evidence of trauma EYES: Lids open and close normally. No blepharitis, entropion or ectropion. PERRL. Conjunctivae  are clear and sclerae are white. Lenses are without opacity EARS: Pinnae are normal. Patient hears normal voice tunes of the examiner MOUTH and THROAT: Lips are without lesions. Oral mucosa is moist and without lesions. Tongue is normal in shape, size, and color and without lesions NECK: supple, trachea midline, no neck masses, no thyroid tenderness, no thyromegaly LYMPHATICS: no LAN in the neck, no supraclavicular LAN RESPIRATORY: breathing is even & unlabored, BS CTAB CARDIAC: RRR, no murmur,no extra heart sounds, no edema GI: abdomen soft, normal BS, no masses, no tenderness, no hepatomegaly, no splenomegaly MUSCULOSKELETAL: Able to move 4 extremities  PSYCHIATRIC: Alert and oriented X 3. Affect and behavior are appropriate  LABS/RADIOLOGY: Labs reviewed: Basic Metabolic Panel:  Recent Labs  08/14/14 1425 08/22/14 0446  NA 137 133*  K 4.4 4.4  CL 106 100*  CO2 25 29  GLUCOSE 109* 123*  BUN 15 10  CREATININE 1.01 0.93  CALCIUM 9.1 8.2*   CBC:  Recent Labs  08/22/14 0446 08/23/14 0538 08/24/14 0424  WBC 7.3 10.4 10.0  HGB 9.7* 9.5* 8.5*  HCT 30.2* 29.2* 25.7*  MCV 93.8 92.7 92.1  PLT 168 146* 147*    Dg C-arm 1-60 Min-no Report  08/21/2014   CLINICAL DATA: hip   C-ARM 1-60 MINUTES  Fluoroscopy was utilized by the requesting physician.  No radiographic  interpretation.    Dg Hip Port Unilat With Pelvis 1v Right  08/21/2014   CLINICAL DATA:  Status post right total hip replacement.  EXAM: DG HIP (WITH OR WITHOUT PELVIS) 1V PORT RIGHT  COMPARISON:  August 21, 2014.  FINDINGS: The femoral and acetabular components appear to be well situated. Expected amount of soft tissue gas is seen lateral to the hip. Surgical staples are noted. No fracture or dislocation is noted.  IMPRESSION: Status post right total hip arthroplasty.   Electronically Signed   By: Marijo Conception, M.D.   On: 08/21/2014 15:24   Dg Hip Port Unilat With Pelvis 1v Right  08/21/2014   CLINICAL DATA:  Status  post right total hip replacement.  EXAM: DG C-ARM 1-60 MIN - NRPT [facility]; DG HIP  1V PORT RIGHT  COMPARISON:  None.  FLUOROSCOPY TIME:  Fluoroscopy Time:  0 minutes 43 seconds  Number of Acquired Images:  3  FINDINGS: There is a total hip replacement on the right with prosthetic components appearing well-seated. No acute fracture or dislocation. No bony destruction appreciable  IMPRESSION: Prosthetic components appear well seated. No acute fracture or dislocation.   Electronically Signed   By: Lowella Grip III M.D.   On: 08/21/2014 14:17    ASSESSMENT/PLAN:  Osteoarthritis S/P right total hip replacement, anterior approach - for rehabilitation; continue aspirin 325 mg 1 tab by mouth twice a day for DVT prophylaxis; Robaxin 500 mg 1 tab by mouth every 6  hours when necessary for muscle spasm; Percocet 5/325 mg 1-2 tabs by mouth every 4 hours when necessary for pain; follow-up with Dr. Jean Rosenthal, orthopedic surgeon, in 2 weeks  Hypertension - continue losartan 100 mg 1 tab by mouth daily  Hypothyroidism - continue Synthroid 125 g 1 tab by mouth daily  Constipation - continue Metamucil 58.6% 1 packet by mouth daily  Hyperlipidemia - continue Zocor 20 mg 1 tab by mouth daily  Frequent UTI - continue sulfamethoxazole-TMP DS 1 tab by mouth daily  Anemia, acute blood loss - hemoglobin 8.5; check CBC    Goals of care:  Short-term rehabilitation    [facility], NP [facility] [phone]

## 2014-08-28 ENCOUNTER — Non-Acute Institutional Stay (SKILLED_NURSING_FACILITY): Payer: Medicare Other | Admitting: Internal Medicine

## 2014-08-28 DIAGNOSIS — N390 Urinary tract infection, site not specified: Secondary | ICD-10-CM

## 2014-08-28 DIAGNOSIS — E039 Hypothyroidism, unspecified: Secondary | ICD-10-CM | POA: Diagnosis not present

## 2014-08-28 DIAGNOSIS — E785 Hyperlipidemia, unspecified: Secondary | ICD-10-CM

## 2014-08-28 DIAGNOSIS — M1611 Unilateral primary osteoarthritis, right hip: Secondary | ICD-10-CM

## 2014-08-28 DIAGNOSIS — K5900 Constipation, unspecified: Secondary | ICD-10-CM | POA: Diagnosis not present

## 2014-08-28 DIAGNOSIS — I10 Essential (primary) hypertension: Secondary | ICD-10-CM

## 2014-08-28 DIAGNOSIS — D62 Acute posthemorrhagic anemia: Secondary | ICD-10-CM | POA: Diagnosis not present

## 2014-08-28 NOTE — Progress Notes (Signed)
Patient ID: Casey Parker, male   DOB: July 04, 1945, 69 y.o.   MRN: [id number]      [facility]   PCP: Precious Reel, MD  Code Status: full code  Allergies  Allergen Reactions  . Augmentin [Amoxicillin-Pot Clavulanate]   . Other     bovine products -itching  . Penicillins     Pt unsure if allergic to penicillin    Chief Complaint  Patient presents with  . New Admit To SNF     HPI:  69 y.o. patient is here for short term rehabilitation post hospital admission with right hip OA. He underwent right total hip arthroplasty. He is seen in his room. His pain is under control with current pain regimen. He had a bowel movement today. Denies any concerns. No concern from staff  Review of Systems:  Constitutional: Negative for fever, chills, malaise/fatigue and diaphoresis.  HENT: Negative for headache, congestion, nasal discharge Eyes: Negative for eye pain, blurred vision, double vision and discharge.  Respiratory: Negative for cough, shortness of breath and wheezing.   Cardiovascular: Negative for chest pain, palpitations, leg swelling.  Gastrointestinal: Negative for heartburn, nausea, vomiting, abdominal pain Genitourinary: Negative for dysuria Skin: Negative for itching, rash.  Neurological: Negative for dizziness, tingling, focal weakness Psychiatric/Behavioral: Negative for depression   Past Medical History  Diagnosis Date  . Hyperlipidemia   . Obesity   . Hypertension   . Osteoarthritis   . Migraines     H/O  . Thyroid disease     hypo  . Cellulitis     recurrent  . Transient ischemic attack (TIA)     presumed  . Diastolic dysfunction   . Left ventricular hypertrophy   . DVT (deep venous thrombosis)     20 yrs ago  . Stroke     11-2008 no residual problems  . Enlarged prostate    Past Surgical History  Procedure Laterality Date  . Hernia repair    . Hand surgery      right  . Tonsillectomy    . Total hip arthroplasty  Right 08/21/2014    Procedure: RIGHT TOTAL HIP ARTHROPLASTY ANTERIOR APPROACH;  Surgeon: Mcarthur Rossetti, MD;  Location: [facility];  Service: Orthopedics;  Laterality: Right;   Social History:   reports that he quit smoking about 26 years ago. He does not have any smokeless tobacco history on file. He reports that he drinks alcohol. He reports that he does not use illicit drugs.  Family History  Problem Relation Age of Onset  . Hypertension Father     Medications:   Medication List       This list is accurate as of: 08/28/14  4:05 PM.  Always use your most recent med list.               aspirin 325 MG tablet  Take 1 tablet (325 mg total) by mouth 2 (two) times daily after a meal.     FISH OIL PO  Take 1,600 Units by mouth daily.     levothyroxine 125 MCG tablet  Commonly known as:  SYNTHROID, LEVOTHROID  Take 125 mcg by mouth daily.     losartan 100 MG tablet  Commonly known as:  COZAAR  Take 100 mg by mouth daily.     methocarbamol 500 MG tablet  Commonly known as:  ROBAXIN  Take 1 tablet (500 mg total) by mouth every 6 (six) hours as needed for muscle spasms.  multivitamin tablet  Take 1 tablet by mouth daily.     oxyCODONE-acetaminophen 5-325 MG per tablet  Commonly known as:  ROXICET  Take 1-2 tablets by mouth every 4 (four) hours as needed.     psyllium 58.6 % powder  Commonly known as:  METAMUCIL  Take 1 packet by mouth daily.     simvastatin 20 MG tablet  Commonly known as:  ZOCOR  Take 20 mg by mouth daily.     SULFAMETHOXAZOLE-TMP DS PO  Take 1 tablet by mouth daily.         Physical Exam Filed Vitals:   08/28/14 1604  BP: 124/63  Pulse: 77  Temp: 98.1 F (36.7 C)  Resp: 18  Weight: 274 lb 12.8 oz (124.648 kg)  SpO2: 97%    General- elderly obese male, in no acute distress Head- normocephalic, atraumatic Throat- moist mucus membrane Eyes- PERRLA, EOMI, no pallor, no icterus, no discharge, normal conjunctiva, normal  sclera Neck- no cervical lymphadenopathy Cardiovascular- normal s1,s2, no murmurs, palpable dorsalis pedis, trace right leg edema Respiratory- bilateral clear to auscultation, no wheeze, no rhonchi, no crackles, no use of accessory muscles Abdomen- bowel sounds present, soft, non tender Musculoskeletal- able to move all 4 extremities, limited right hip ROM Neurological- no focal deficit, alert and oriented to person, place and time Skin- warm and dry, right hip surgical incision with mepilex dressing Psychiatry- normal mood and affect    Labs reviewed: Basic Metabolic Panel:  Recent Labs  08/14/14 1425 08/22/14 0446  NA 137 133*  K 4.4 4.4  CL 106 100*  CO2 25 29  GLUCOSE 109* 123*  BUN 15 10  CREATININE 1.01 0.93  CALCIUM 9.1 8.2*   CBC:  Recent Labs  08/22/14 0446 08/23/14 0538 08/24/14 0424  WBC 7.3 10.4 10.0  HGB 9.7* 9.5* 8.5*  HCT 30.2* 29.2* 25.7*  MCV 93.8 92.7 92.1  PLT 168 146* 147*    Radiological exam:  Dg Hip Port Unilat With Pelvis 1v Right  08/21/2014   CLINICAL DATA:  Status post right total hip replacement.  EXAM: DG C-ARM 1-60 MIN - NRPT [facility]; DG HIP  1V PORT RIGHT  COMPARISON:  None.  FLUOROSCOPY TIME:  Fluoroscopy Time:  0 minutes 43 seconds  Number of Acquired Images:  3  FINDINGS: There is a total hip replacement on the right with prosthetic components appearing well-seated. No acute fracture or dislocation. No bony destruction appreciable  IMPRESSION: Prosthetic components appear well seated. No acute fracture or dislocation.   Electronically Signed   By: Lowella Grip III M.D.   On: 08/21/2014 14:17    Assessment/Plan  Right hip Osteoarthritis  S/P right total hip replacement. Continue percocet 5-325 mg 1-2 tab q4h prn pain with robaxin 500 mg q6h prn muscle spasm. Continue aspirin 325 mg bid for dvt prophylaxis. Will have him work with physical therapy and occupational therapy team to help with gait training and muscle strengthening  exercises.fall precautions. Skin care. Encourage to be out of bed. Has follow up with orthopedics  Blood loss anemia Post op, monitor h&h CBC    Component Value Date/Time   WBC 10.0 08/24/2014 0424   RBC 2.79* 08/24/2014 0424   HGB 8.5* 08/24/2014 0424   HCT 25.7* 08/24/2014 0424   PLT 147* 08/24/2014 0424   MCV 92.1 08/24/2014 0424   MCH 30.5 08/24/2014 0424   MCHC 33.1 08/24/2014 0424   RDW 13.8 08/24/2014 0424   LYMPHSABS 1.2 12/19/2008 0958   MONOABS  0.6 12/19/2008 0958   EOSABS 0.1 12/19/2008 0958   BASOSABS 0.0 12/19/2008 0958    Constipation Stable with metamucil, monitor  HTN Continue losartan 100 mg daily and monitor bp  HLD Continue zocor 20 mg daily  Hypothyroidism  continue Synthroid 125 mcg daily  recurrent UTI  Asymptomatic currently, continue prophylactic bactrim ds 1 tab daily and monitor  Goals of care: short term rehabilitation   Labs/tests ordered: cbc  Family/ staff Communication: reviewed care plan with patient and nursing supervisor    Blanchie Serve, MD  [place] [phone] (Monday-Friday 8 am - 5 pm) [phone] (afterhours)

## 2014-09-01 ENCOUNTER — Encounter: Payer: Self-pay | Admitting: Adult Health

## 2014-09-01 ENCOUNTER — Non-Acute Institutional Stay (SKILLED_NURSING_FACILITY): Payer: Medicare Other | Admitting: Adult Health

## 2014-09-01 DIAGNOSIS — K5900 Constipation, unspecified: Secondary | ICD-10-CM

## 2014-09-01 DIAGNOSIS — Z96649 Presence of unspecified artificial hip joint: Secondary | ICD-10-CM

## 2014-09-01 DIAGNOSIS — M1611 Unilateral primary osteoarthritis, right hip: Secondary | ICD-10-CM | POA: Diagnosis not present

## 2014-09-01 DIAGNOSIS — N390 Urinary tract infection, site not specified: Secondary | ICD-10-CM | POA: Diagnosis not present

## 2014-09-01 DIAGNOSIS — E785 Hyperlipidemia, unspecified: Secondary | ICD-10-CM

## 2014-09-01 DIAGNOSIS — D62 Acute posthemorrhagic anemia: Secondary | ICD-10-CM | POA: Diagnosis not present

## 2014-09-01 DIAGNOSIS — E039 Hypothyroidism, unspecified: Secondary | ICD-10-CM | POA: Diagnosis not present

## 2014-09-01 DIAGNOSIS — I10 Essential (primary) hypertension: Secondary | ICD-10-CM | POA: Diagnosis not present

## 2014-09-01 DIAGNOSIS — Z96641 Presence of right artificial hip joint: Secondary | ICD-10-CM

## 2014-09-03 DIAGNOSIS — M1611 Unilateral primary osteoarthritis, right hip: Secondary | ICD-10-CM | POA: Diagnosis not present

## 2014-09-09 ENCOUNTER — Ambulatory Visit: Payer: Medicare Other | Attending: Orthopaedic Surgery

## 2014-09-09 DIAGNOSIS — R29898 Other symptoms and signs involving the musculoskeletal system: Secondary | ICD-10-CM

## 2014-09-09 DIAGNOSIS — R269 Unspecified abnormalities of gait and mobility: Secondary | ICD-10-CM | POA: Insufficient documentation

## 2014-09-09 NOTE — Patient Instructions (Signed)
KNEE: Extension, Long Arc Quad (Weight)   Place weight around leg. Raise leg until knee is straight. Hold __5_ seconds. Use ___ lb weight. _2x10__ reps per set, __3_ sets per day   Knee High   Holding stable object, raise knee to hip level, then lower knee. Repeat with other knee. Complete __10_ repetitions. Do __2__ sessions per day.  ABDUCTION: Standing (Active)   Stand, feet flat. Lift right leg out to side. Use _0__ lbs. Complete __10_ repetitions. Perform __2_ sessions per day.    EXTENSION: Standing (Active)  Stand, both feet flat. Draw right leg behind body as far as possible. Use 0___ lbs. Complete 10 repetitions. Perform __2_ sessions per day.  Copyright  VHI. All rights reserved.   [place] [address] [place], [place] [address] Phone # [phone] Fax [phone]

## 2014-09-09 NOTE — Therapy (Signed)
[facility] [address], [place], [place], [address] Phone: [phone]   Fax:  [phone]  Physical Therapy Evaluation  Patient Details  Name: Casey Parker MRN: [id number] Date of Birth: [birth year] Referring Provider:  Mcarthur Rossetti*  Encounter Date: 09/09/2014      PT End of Session - 09/09/14 1304    Visit Number 1   Date for PT Re-Evaluation 11/04/14   PT Start Time [id number]   PT Stop Time 1308   PT Time Calculation (min) 37 min   Activity Tolerance Patient tolerated treatment well   Behavior During Therapy [facility] for tasks assessed/performed      Past Medical History  Diagnosis Date  . Hyperlipidemia   . Obesity   . Hypertension   . Osteoarthritis   . Migraines     H/O  . Thyroid disease     hypo  . Cellulitis     recurrent  . Transient ischemic attack (TIA)     presumed  . Diastolic dysfunction   . Left ventricular hypertrophy   . DVT (deep venous thrombosis)     20 yrs ago  . Stroke     11-2008 no residual problems  . Enlarged prostate     Past Surgical History  Procedure Laterality Date  . Hernia repair    . Hand surgery      right  . Tonsillectomy    . Total hip arthroplasty Right 08/21/2014    Procedure: RIGHT TOTAL HIP ARTHROPLASTY ANTERIOR APPROACH;  Surgeon: Mcarthur Rossetti, MD;  Location: [facility];  Service: Orthopedics;  Laterality: Right;    There were no vitals filed for this visit.  Visit Diagnosis:  Weakness of both legs - Plan: PT plan of care cert/re-cert  Abnormality of gait - Plan: PT plan of care cert/re-cert      Subjective Assessment - 09/09/14 1232    Subjective Pt is a 69 y.o. male who presents to PT s/p Rt anterior total hip replacement performed 08/21/14.  Pt had a 2 weeks stay at [facility] after surgery.  He reports that he is not having pain but feels like he is very stiff and needs to improve his mobility.  He would like to resume his regular  exercise routine that he was doing before surgery for weight loss.     Pertinent History has lost 105 lbs, goal is 150 lb loss   Limitations Walking;Standing   How long can you walk comfortably? pt has walked 5-10 minutes max since surgery   Patient Stated Goals reduce stiffness, improve mobility and strength, goal to lose ~40-45 lbs   Currently in Pain? No/denies            [facility] PT Assessment - 09/09/14 0001    Assessment   Medical Diagnosis s/p Rt direct anterior hip replacement   Onset Date/Surgical Date 08/21/14   Next MD Visit 10/01/14   Prior Therapy at [facility]   Precautions   Precautions None   Restrictions   Weight Bearing Restrictions No   Balance Screen   Has the patient fallen in the past 6 months No   Has the patient had a decrease in activity level because of a fear of falling?  No   Is the patient reluctant to leave their home because of a fear of falling?  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   Leisure exercise for weight loss, walking for weight loss   Cognition   Overall Cognitive Status Within Functional Limits for tasks assessed   Observation/Other Assessments   Focus on Therapeutic Outcomes (FOTO)  46% limitation   Posture/Postural Control   Posture/Postural Control No significant limitations   ROM / Strength   AROM / PROM / Strength AROM;PROM;Strength   AROM   Overall AROM  Deficits   Overall AROM Comments Rt knee flexibility is limited by 50% due to edema in Rt LE   Strength   Overall Strength Deficits   Overall Strength Comments Bilateral hips 4/5, knees 4/5, DF 4+/5   Palpation   Palpation comment No palpable  tenderness today.  Pt with edema in Rt>Lt LE-pt reports that this is normal for him   Transfers   Transfers Sit to Stand;Stand to Sit   Sit to Stand 6: Modified independent (Device/Increase time);With upper extremity assist  trunk extension with this motion   Stand to Sit 6: Modified independent (Device/Increase time);With upper extremity assist   Ambulation/Gait   Ambulation/Gait Yes   Ambulation/Gait Assistance 6: Modified independent (Device/Increase time)   Ambulation Distance (Feet) 100 Feet   Assistive device Straight cane   Gait Pattern Decreased stance time - right;Step-through pattern;Decreased hip/knee flexion - right   Ambulation Surface Level   Stairs Yes   Stair Management Technique Two rails;Step to pattern   Number of Stairs 4   Height of Stairs 6                           PT Education - 09/09/14 1258    Education provided Yes   Education Details HEP: standing hip exercises, long arc quads   Person(s) Educated Patient   Methods Explanation;Demonstration;Handout   Comprehension Verbalized understanding;Returned demonstration          PT Short Term Goals - 09/09/14 1311    PT SHORT TERM GOAL #1   Title be independent in initial HEP   Time 4   Period Weeks   Status New   PT SHORT TERM GOAL #2   Title perform sit to stand with 25% less UE support   Time 4   Period Weeks   Status New   PT SHORT TERM GOAL #3   Title walk for 15 minutes for exercise without need to rest   Time 4   Period Weeks   Status New           PT Long Term Goals - 09/09/14 1222    PT LONG TERM GOAL #1   Title be independent in advanced HEP   Time 8   Period Weeks   Status New   PT LONG TERM GOAL #2   Title reduce FOTO to < or = to 40% limitation   Time 8   Period Weeks   Status New   PT LONG TERM GOAL #3   Title walk for 30 minutes without rest   Time 8   Period Weeks   Status New   PT LONG TERM GOAL #4   Title return to regular exercise routine  without limitation   Time 8   Period Weeks   Status New  Plan - 30-Sep-2014 1307    Clinical Impression Statement Pt presents to PT s/p Rt total hip replacement.  Pt demonstrates gait abnormality, bil. LE weakness and difficulity with steps and sit to stand due to bilateral knee OA.  Pt has lost 105 lbs and has a goal of 45 more lbs.  He would like to return to a regular exercise program to aid in this process and allow for bil. TKA.  Pt will benfit from skilled PT for LE strength progress and mobility training to resume regular exercise routine.     Pt will benefit from skilled therapeutic intervention in order to improve on the following deficits Abnormal gait;Difficulty walking;Obesity;Decreased activity tolerance;Decreased strength;Decreased endurance   Rehab Potential Good   PT Frequency 2x / week   PT Duration 8 weeks   PT Treatment/Interventions ADLs/Self Care Home Management;Cryotherapy;Electrical Stimulation;Functional mobility training;Stair training;Gait training;Moist Heat;Therapeutic activities;Therapeutic exercise;Neuromuscular re-education;Patient/family education;Passive range of motion;Scar mobilization;Manual techniques   PT Next Visit Plan LE strength, work on sit to stand, step training, NuStep   Consulted and Agree with Plan of Care Patient          G-Codes - 09/30/2014 1223    Functional Assessment Tool Used FOTO: 46% limitation   Functional Limitation Mobility: Walking and moving around   Mobility: Walking and Moving Around Current Status [phone]) At least 40 percent but less than 60 percent impaired, limited or restricted   Mobility: Walking and Moving Around Goal Status [phone]) At least 40 percent but less than 60 percent impaired, limited or restricted       Problem List Patient Active Problem List   Diagnosis Date Noted  . Osteoarthritis of right hip 08/21/2014  . Status post total replacement of right hip 08/21/2014  . Obesity   .  Osteoarthritis   . Migraines   . Cellulitis   . Transient ischemic attack (TIA)   . Diastolic dysfunction   . Left ventricular hypertrophy     TAKACS,KELLY, PT 30-Sep-2014, 1:18 PM  [facility] [address], [place], [place], [address] Phone: [phone]   Fax:  [phone]

## 2014-09-11 ENCOUNTER — Ambulatory Visit: Payer: Medicare Other | Admitting: Physical Therapy

## 2014-09-11 DIAGNOSIS — R29898 Other symptoms and signs involving the musculoskeletal system: Secondary | ICD-10-CM

## 2014-09-11 DIAGNOSIS — R269 Unspecified abnormalities of gait and mobility: Secondary | ICD-10-CM | POA: Diagnosis not present

## 2014-09-11 NOTE — Therapy (Signed)
[facility] [address], [place], [place], [address] Phone: [phone]   Fax:  [phone]  Physical Therapy Treatment  Patient Details  Name: Casey Parker MRN: [id number] Date of Birth: 01/14/1946 Referring Provider:  Shon Baton, MD  Encounter Date: 09/11/2014      PT End of Session - 09/11/14 1113    Visit Number 2   Date for PT Re-Evaluation 11/04/14   PT Start Time 1058   PT Stop Time 1144   PT Time Calculation (min) 46 min   Activity Tolerance Patient tolerated treatment well   Behavior During Therapy [facility] for tasks assessed/performed      Past Medical History  Diagnosis Date  . Hyperlipidemia   . Obesity   . Hypertension   . Osteoarthritis   . Migraines     H/O  . Thyroid disease     hypo  . Cellulitis     recurrent  . Transient ischemic attack (TIA)     presumed  . Diastolic dysfunction   . Left ventricular hypertrophy   . DVT (deep venous thrombosis)     20 yrs ago  . Stroke     11-2008 no residual problems  . Enlarged prostate     Past Surgical History  Procedure Laterality Date  . Hernia repair    . Hand surgery      right  . Tonsillectomy    . Total hip arthroplasty Right 08/21/2014    Procedure: RIGHT TOTAL HIP ARTHROPLASTY ANTERIOR APPROACH;  Surgeon: Mcarthur Rossetti, MD;  [facility]: [facility];  Service: Orthopedics;  Laterality: Right;    There were no vitals filed for this visit.  Visit Diagnosis:  Weakness of both legs  Abnormality of gait      Subjective Assessment - 09/11/14 1109    Subjective Pt is a 69 y.o male who presents to PT s/p Rt anterior total hip replacement performed 08/21/14. No complains of pain, but left quadriceps feesl tight and discomfort at incision side at times.   Currently in Pain? No/denies                         [facility] Adult PT Treatment/Exercise - 09/11/14 0001    Exercises   Exercises Knee/Hip   Knee/Hip Exercises:  Stretches   Quad Stretch 3 reps;20 seconds  on stairs with Rt LE   Knee/Hip Exercises: Aerobic   Nustep seat #11, arms #8   Knee/Hip Exercises: Standing   Hip ADduction Strengthening;2 sets  each leg   Hip Extension Stengthening;2 sets;10 reps  need reminders for pacing   Other Standing Knee Exercises Trampoline 3 directions x 1 min each   Knee/Hip Exercises: Seated   Long Arc Quad Strengthening;Both;3 sets;10 reps   Long Arc Quad Weight 2 lbs.   Hamstring Curl Strengthening;Both;3 sets;10 reps  with red tband   Hamstring Limitations 2                  PT Short Term Goals - 09/09/14 1311    PT SHORT TERM GOAL #1   Title be independent in initial HEP   Time 4   Period Weeks   Status New   PT SHORT TERM GOAL #2   Title perform sit to stand with 25% less UE support   Time 4   Period Weeks   Status New   PT SHORT TERM GOAL #3   Title walk for 15 minutes for  exercise without need to rest   Time 4   Period Weeks   Status New           PT Long Term Goals - 09/09/14 1222    PT LONG TERM GOAL #1   Title be independent in advanced HEP   Time 8   Period Weeks   Status New   PT LONG TERM GOAL #2   Title reduce FOTO to < or = to 40% limitation   Time 8   Period Weeks   Status New   PT LONG TERM GOAL #3   Title walk for 30 minutes without rest   Time 8   Period Weeks   Status New   PT LONG TERM GOAL #4   Title return to regular exercise routine without limitation   Time 8   Period Weeks   Status New               Plan - 09/11/14 1115    Clinical Impression Statement Pt presents to PT s/p Rt total hip replacement. Pt with weakness in b LE and difficulties with steps and sit to stand. Pt reports he needs TKA in the near future Pt .will continue to benefit from PT.   Pt will benefit from skilled therapeutic intervention in order to improve on the following deficits Abnormal gait;Difficulty walking;Obesity;Decreased activity tolerance;Decreased  strength;Decreased endurance   Rehab Potential Good   PT Frequency 2x / week   PT Duration 8 weeks   PT Treatment/Interventions ADLs/Self Care Home Management;Cryotherapy;Electrical Stimulation;Functional mobility training;Stair training;Gait training;Moist Heat;Therapeutic activities;Therapeutic exercise;Neuromuscular re-education;Patient/family education;Passive range of motion;Scar mobilization;Manual techniques   PT Next Visit Plan Continue with LE strength, sit to stand, step training, NU Step seat # 11 may be 12, arms #8   Consulted and Agree with Plan of Care Patient        Problem List Patient Active Problem List   Diagnosis Date Noted  . Osteoarthritis of right hip 08/21/2014  . Status post total replacement of right hip 08/21/2014  . Obesity   . Osteoarthritis   . Migraines   . Cellulitis   . Transient ischemic attack (TIA)   . Diastolic dysfunction   . Left ventricular hypertrophy     NAUMANN-HOUEGNIFIO,[name] PTA 09/11/2014, 11:48 AM  [facility] [address] [place], [place], [address] Phone: [phone]   Fax:  [phone]

## 2014-09-16 ENCOUNTER — Encounter: Payer: Self-pay | Admitting: Physical Therapy

## 2014-09-16 ENCOUNTER — Ambulatory Visit: Payer: Medicare Other | Admitting: Physical Therapy

## 2014-09-16 DIAGNOSIS — R29898 Other symptoms and signs involving the musculoskeletal system: Secondary | ICD-10-CM | POA: Diagnosis not present

## 2014-09-16 DIAGNOSIS — R269 Unspecified abnormalities of gait and mobility: Secondary | ICD-10-CM | POA: Diagnosis not present

## 2014-09-16 NOTE — Therapy (Signed)
[facility] [address] [place], [place], [address] Phone: [phone]   Fax:  [phone]  Physical Therapy Treatment  Patient Details  Name: Casey Parker MRN: [id number] Date of Birth: 1945/08/27 Referring Provider:  Shon Baton, MD  Encounter Date: 09/16/2014      PT End of Session - 09/16/14 1426    Visit Number 3   Date for PT Re-Evaluation 11/04/14   PT Start Time 1400   PT Stop Time 1445   PT Time Calculation (min) 45 min   Activity Tolerance Patient tolerated treatment well   Behavior During Therapy [facility] for tasks assessed/performed      Past Medical History  Diagnosis Date  . Hyperlipidemia   . Obesity   . Hypertension   . Osteoarthritis   . Migraines     H/O  . Thyroid disease     hypo  . Cellulitis     recurrent  . Transient ischemic attack (TIA)     presumed  . Diastolic dysfunction   . Left ventricular hypertrophy   . DVT (deep venous thrombosis)     20 yrs ago  . Stroke     11-2008 no residual problems  . Enlarged prostate     Past Surgical History  Procedure Laterality Date  . Hernia repair    . Hand surgery      right  . Tonsillectomy    . Total hip arthroplasty Right 08/21/2014    Procedure: RIGHT TOTAL HIP ARTHROPLASTY ANTERIOR APPROACH;  Surgeon: Mcarthur Rossetti, MD;  Location: [facility];  Service: Orthopedics;  Laterality: Right;    There were no vitals filed for this visit.  Visit Diagnosis:  Weakness of both legs  Abnormality of gait      Subjective Assessment - 09/16/14 1403    Subjective Pt is a 69 y.o. male who presents to PT s/p anterior right hip replacement on 08/21/14. No complains of pain in right hip, but in bil knees pt is planning to have TKA.    Currently in Pain? No/denies   Multiple Pain Sites No                         [facility] Adult PT Treatment/Exercise - 09/16/14 0001    Exercises   Exercises Knee/Hip   Knee/Hip Exercises:  Stretches   Quad Stretch 3 reps;20 seconds  on stairs with right LE   Knee/Hip Exercises: Aerobic   Nustep seat #13, arms #9 x 8 min   Knee/Hip Exercises: Standing   Hip ADduction Strengthening;2 sets  each leg   Hip Extension Stengthening;2 sets;10 reps  each leg   Forward Step Up 2 sets;10 reps;Hand Hold: 2  needs a lot of pull with B UE   Other Standing Knee Exercises Trampoline 3 directions x 1 min each   Knee/Hip Exercises: Seated   Long Arc Quad Strengthening;Both;3 sets;10 reps   Long Arc Quad Weight 2 lbs.   Hamstring Curl Strengthening;Both;10 reps;4 sets   Hamstring Limitations 2   Sit to Sand 2 sets;10 reps  with 2 blue pillows in chair without armrest                  PT Short Term Goals - 09/09/14 1311    PT SHORT TERM GOAL #1   Title be independent in initial HEP   Time 4   Period Weeks   Status New   PT SHORT TERM GOAL #  2   Title perform sit to stand with 25% less UE support   Time 4   Period Weeks   Status New   PT SHORT TERM GOAL #3   Title walk for 15 minutes for exercise without need to rest   Time 4   Period Weeks   Status New           PT Long Term Goals - 09/09/14 1222    PT LONG TERM GOAL #1   Title be independent in advanced HEP   Time 8   Period Weeks   Status New   PT LONG TERM GOAL #2   Title reduce FOTO to < or = to 40% limitation   Time 8   Period Weeks   Status New   PT LONG TERM GOAL #3   Title walk for 30 minutes without rest   Time 8   Period Weeks   Status New   PT LONG TERM GOAL #4   Title return to regular exercise routine without limitation   Time 8   Period Weeks   Status New               Plan - 09/16/14 1426    Clinical Impression Statement Pt presents to Pt s/p Rt total hip replacement. Pt was able to transfer sit to stand from chair without armrest (but 2blue pillows) - pt . reports he used only chairs with arm rest for over one year.   Rehab Potential Good   PT Frequency 2x / week   PT  Duration 8 weeks   PT Treatment/Interventions ADLs/Self Care Home Management;Cryotherapy;Electrical Stimulation;Functional mobility training;Stair training;Gait training;Moist Heat;Therapeutic activities;Therapeutic exercise;Neuromuscular re-education;Patient/family education;Passive range of motion;Scar mobilization;Manual techniques   PT Next Visit Plan Continue with LE strength, sit to stand, step training, NU Step seat # 13, arms #8   Consulted and Agree with Plan of Care Patient        Problem List Patient Active Problem List   Diagnosis Date Noted  . Osteoarthritis of right hip 08/21/2014  . Status post total replacement of right hip 08/21/2014  . Obesity   . Osteoarthritis   . Migraines   . Cellulitis   . Transient ischemic attack (TIA)   . Diastolic dysfunction   . Left ventricular hypertrophy     NAUMANN-HOUEGNIFIO,Deshay Blumenfeld PTA 09/16/2014, 2:39 PM  [facility] [address] [place], [place], [address] Phone: [phone]   Fax:  [phone]

## 2014-09-22 ENCOUNTER — Encounter: Payer: Self-pay | Admitting: Physical Therapy

## 2014-09-22 ENCOUNTER — Ambulatory Visit: Payer: Medicare Other | Admitting: Physical Therapy

## 2014-09-22 DIAGNOSIS — R269 Unspecified abnormalities of gait and mobility: Secondary | ICD-10-CM | POA: Diagnosis not present

## 2014-09-22 DIAGNOSIS — R29898 Other symptoms and signs involving the musculoskeletal system: Secondary | ICD-10-CM | POA: Diagnosis not present

## 2014-09-22 NOTE — Therapy (Signed)
[facility] [address] [place], [place], [address] Phone: [phone]   Fax:  [phone]  Physical Therapy Treatment  Patient Details  Name: Casey Parker MRN: [id number] Date of Birth: 05/06/1945 Referring Provider:  Mcarthur Rossetti*  Encounter Date: 09/22/2014      PT End of Session - 09/22/14 1106    Visit Number 4   Date for PT Re-Evaluation 11/04/14   PT Start Time 1050   PT Stop Time 1138   PT Time Calculation (min) 48 min   Activity Tolerance Patient tolerated treatment well   Behavior During Therapy [facility] for tasks assessed/performed      Past Medical History  Diagnosis Date  . Hyperlipidemia   . Obesity   . Hypertension   . Osteoarthritis   . Migraines     H/O  . Thyroid disease     hypo  . Cellulitis     recurrent  . Transient ischemic attack (TIA)     presumed  . Diastolic dysfunction   . Left ventricular hypertrophy   . DVT (deep venous thrombosis)     20 yrs ago  . Stroke     11-2008 no residual problems  . Enlarged prostate     Past Surgical History  Procedure Laterality Date  . Hernia repair    . Hand surgery      right  . Tonsillectomy    . Total hip arthroplasty Right 08/21/2014    Procedure: RIGHT TOTAL HIP ARTHROPLASTY ANTERIOR APPROACH;  Surgeon: Mcarthur Rossetti, MD;  [facility]: [facility];  Service: Orthopedics;  Laterality: Right;    There were no vitals filed for this visit.  Visit Diagnosis:  Weakness of both legs  Abnormality of gait      Subjective Assessment - 09/22/14 1103    Subjective Pt is a 69 y.o. male who presents to PT s/p ant Rt hip replacement on 08/21/14. No complains of pain in Rt hip, and notices his legs are getting stronger    Multiple Pain Sites No                         [facility] Adult PT Treatment/Exercise - 09/22/14 0001    Exercises   Exercises Knee/Hip   Knee/Hip Exercises: Stretches   Quad Stretch 3 reps;20  seconds  with Rt  LE   Knee/Hip Exercises: Aerobic   Nustep seat #15, arms #11 x 25min   Knee/Hip Exercises: Standing   Hip ADduction Strengthening;2 sets  each leg, 2# added   Hip Extension Stengthening;2 sets;10 reps  each leg 2# added    Forward Step Up 2 sets;10 reps;Hand Hold: 2  improvement to day with clearance of legs   Other Standing Knee Exercises Trampoline 3 directions x 1 min each   Knee/Hip Exercises: Seated   Long Arc Quad Strengthening;Both;3 sets;10 reps   Hamstring Curl Strengthening;Both;10 reps;4 sets   Hamstring Limitations 2   Sit to Sand 2 sets;10 reps  only one blue pillow challenging                  PT Short Term Goals - 09/22/14 1108    PT SHORT TERM GOAL #1   Title be independent in initial HEP   Time 4   Period Weeks   Status On-going   PT SHORT TERM GOAL #2   Title perform sit to stand with 25% less UE support   Time 4  Period Weeks   Status On-going   PT SHORT TERM GOAL #3   Title walk for 15 minutes for exercise without need to rest   Time 4   Period Weeks   Status Achieved           PT Long Term Goals - 09/22/14 1114    PT LONG TERM GOAL #1   Title be independent in advanced HEP   Time 8   Period Weeks   Status On-going   PT LONG TERM GOAL #2   Title reduce FOTO to < or = to 40% limitation   Time 8   Period Weeks   Status On-going   PT LONG TERM GOAL #3   Title walk for 30 minutes without rest   Time 8   Period Weeks   Status On-going   PT LONG TERM GOAL #4   Title return to regular exercise routine without limitation   Time 8   Period Weeks   Status On-going               Plan - 09/22/14 1106    Clinical Impression Statement Pt shows improvement with lifting up his legs on stairs for tapping up. Transfers sit to stand are getting easier.    Rehab Potential Good   PT Frequency 2x / week   PT Duration 8 weeks   PT Treatment/Interventions ADLs/Self Care Home Management;Cryotherapy;Electrical  Stimulation;Functional mobility training;Stair training;Gait training;Moist Heat;Therapeutic activities;Therapeutic exercise;Neuromuscular re-education;Patient/family education;Passive range of motion;Scar mobilization;Manual techniques   PT Next Visit Plan Continue with LE strength, sit to stand, step training, NU Step seat # 15, arms #11   Consulted and Agree with Plan of Care Patient        Problem List Patient Active Problem List   Diagnosis Date Noted  . Osteoarthritis of right hip 08/21/2014  . Status post total replacement of right hip 08/21/2014  . Obesity   . Osteoarthritis   . Migraines   . Cellulitis   . Transient ischemic attack (TIA)   . Diastolic dysfunction   . Left ventricular hypertrophy     NAUMANN-HOUEGNIFIO,Towanda Hornstein PTA 09/22/2014, 11:41 AM  [facility] [address] [place], [place], [address] Phone: [phone]   Fax:  [phone]

## 2014-09-25 ENCOUNTER — Encounter: Payer: Self-pay | Admitting: Physical Therapy

## 2014-09-25 ENCOUNTER — Ambulatory Visit: Payer: Medicare Other | Admitting: Physical Therapy

## 2014-09-25 DIAGNOSIS — R269 Unspecified abnormalities of gait and mobility: Secondary | ICD-10-CM

## 2014-09-25 DIAGNOSIS — R29898 Other symptoms and signs involving the musculoskeletal system: Secondary | ICD-10-CM

## 2014-09-25 NOTE — Therapy (Signed)
[facility] [address], [place], [place], [address] Phone: [phone]   Fax:  [phone]  Physical Therapy Treatment  Patient Details  Name: Casey Parker MRN: [id number] Date of Birth: 10/29/1945 Referring Provider:  Mcarthur Rossetti*  Encounter Date: 09/25/2014      PT End of Session - 09/25/14 1028    Visit Number 5   Date for PT Re-Evaluation 11/04/14   PT Start Time 1008   PT Stop Time 1059   PT Time Calculation (min) 51 min   Activity Tolerance Patient tolerated treatment well   Behavior During Therapy [facility] for tasks assessed/performed      Past Medical History  Diagnosis Date  . Hyperlipidemia   . Obesity   . Hypertension   . Osteoarthritis   . Migraines     H/O  . Thyroid disease     hypo  . Cellulitis     recurrent  . Transient ischemic attack (TIA)     presumed  . Diastolic dysfunction   . Left ventricular hypertrophy   . DVT (deep venous thrombosis)     20 yrs ago  . Stroke     11-2008 no residual problems  . Enlarged prostate     Past Surgical History  Procedure Laterality Date  . Hernia repair    . Hand surgery      right  . Tonsillectomy    . Total hip arthroplasty Right 08/21/2014    Procedure: RIGHT TOTAL HIP ARTHROPLASTY ANTERIOR APPROACH;  Surgeon: Mcarthur Rossetti, MD;  Location: [facility];  Service: Orthopedics;  Laterality: Right;    There were no vitals filed for this visit.  Visit Diagnosis:  Weakness of both legs  Abnormality of gait      Subjective Assessment - 09/25/14 1026    Subjective Pt is a 69 y.o. male who presents to PT s/p ant Rt hip replacement on 08/21/14. No complains of pain in Rt hip, and notices his legs are getting stronger    Multiple Pain Sites No                         [facility] Adult PT Treatment/Exercise - 09/25/14 0001    Exercises   Exercises Knee/Hip   Knee/Hip Exercises: Stretches   Quad Stretch 3 reps;20  seconds  with each leg   Knee/Hip Exercises: Aerobic   Nustep seat #15, arms #12 x 25min, level1   Knee/Hip Exercises: Standing   Hip ADduction Strengthening;2 sets  2# added, increase weight next visit   Hip Extension Stengthening;2 sets;10 reps  2# added   Forward Step Up 2 sets;10 reps;Hand Hold: 2  improved clearance of foot   Other Standing Knee Exercises Trampoline 3 directions x 1 min each   Other Standing Knee Exercises Hamstrings curls 2# added in standing   Knee/Hip Exercises: Seated   Long Arc Quad Strengthening;Both;3 sets;10 reps   Long Arc Quad Weight 2 lbs.   Sit to Sand 2 sets;10 reps  sitting on one pillow needs A by PTA, focused on foreward le                  PT Short Term Goals - 09/22/14 1108    PT SHORT TERM GOAL #1   Title be independent in initial HEP   Time 4   Period Weeks   Status On-going   PT SHORT TERM GOAL #2   Title perform sit to  stand with 25% less UE support   Time 4   Period Weeks   Status On-going   PT SHORT TERM GOAL #3   Title walk for 15 minutes for exercise without need to rest   Time 4   Period Weeks   Status Achieved           PT Long Term Goals - 09/22/14 1114    PT LONG TERM GOAL #1   Title be independent in advanced HEP   Time 8   Period Weeks   Status On-going   PT LONG TERM GOAL #2   Title reduce FOTO to < or = to 40% limitation   Time 8   Period Weeks   Status On-going   PT LONG TERM GOAL #3   Title walk for 30 minutes without rest   Time 8   Period Weeks   Status On-going   PT LONG TERM GOAL #4   Title return to regular exercise routine without limitation   Time 8   Period Weeks   Status On-going               Plan - 09/25/14 1029    Clinical Impression Statement Pt shows improvement with the control into hip flexion with Rt LE, lifting up onto step is still challenging due to weakness. Transfer sit to stand still challenging but improving   Pt will benefit from skilled therapeutic  intervention in order to improve on the following deficits Abnormal gait;Difficulty walking;Obesity;Decreased activity tolerance;Decreased strength;Decreased endurance   Rehab Potential Good   PT Frequency 2x / week   PT Duration 8 weeks   PT Treatment/Interventions ADLs/Self Care Home Management;Cryotherapy;Electrical Stimulation;Functional mobility training;Stair training;Gait training;Moist Heat;Therapeutic activities;Therapeutic exercise;Neuromuscular re-education;Patient/family education;Passive range of motion;Scar mobilization;Manual techniques   PT Next Visit Plan Continue with LE strength, sit to stand, step training, NU Step seat # 15, arms #12   Consulted and Agree with Plan of Care Patient        Problem List Patient Active Problem List   Diagnosis Date Noted  . Osteoarthritis of right hip 08/21/2014  . Status post total replacement of right hip 08/21/2014  . Obesity   . Osteoarthritis   . Migraines   . Cellulitis   . Transient ischemic attack (TIA)   . Diastolic dysfunction   . Left ventricular hypertrophy     NAUMANN-HOUEGNIFIO,Karlo Goeden PTA 09/25/2014, 10:57 AM  [facility] [address], [place], [place], [address] Phone: [phone]   Fax:  [phone]

## 2014-09-28 ENCOUNTER — Ambulatory Visit: Payer: Medicare Other | Admitting: Physical Therapy

## 2014-09-28 ENCOUNTER — Encounter: Payer: Self-pay | Admitting: Physical Therapy

## 2014-09-28 DIAGNOSIS — R29898 Other symptoms and signs involving the musculoskeletal system: Secondary | ICD-10-CM

## 2014-09-28 DIAGNOSIS — R269 Unspecified abnormalities of gait and mobility: Secondary | ICD-10-CM | POA: Diagnosis not present

## 2014-09-28 NOTE — Therapy (Signed)
[facility] [address], [place], [place], [address] Phone: [phone]   Fax:  [phone]  Physical Therapy Treatment  Patient Details  Name: Casey Parker MRN: [id number] Date of Birth: 20-Oct-1945 Referring Provider:  Shon Baton, MD  Encounter Date: 09/28/2014      PT End of Session - 09/28/14 1144    Visit Number 6   Date for PT Re-Evaluation 11/04/14   PT Start Time [id number]   PT Stop Time 1135   PT Time Calculation (min) 48 min   Activity Tolerance Patient tolerated treatment well   Behavior During Therapy [facility] for tasks assessed/performed      Past Medical History  Diagnosis Date  . Hyperlipidemia   . Obesity   . Hypertension   . Osteoarthritis   . Migraines     H/O  . Thyroid disease     hypo  . Cellulitis     recurrent  . Transient ischemic attack (TIA)     presumed  . Diastolic dysfunction   . Left ventricular hypertrophy   . DVT (deep venous thrombosis)     20 yrs ago  . Stroke     11-2008 no residual problems  . Enlarged prostate     Past Surgical History  Procedure Laterality Date  . Hernia repair    . Hand surgery      right  . Tonsillectomy    . Total hip arthroplasty Right 08/21/2014    Procedure: RIGHT TOTAL HIP ARTHROPLASTY ANTERIOR APPROACH;  Surgeon: Mcarthur Rossetti, MD;  Location: [facility];  Service: Orthopedics;  Laterality: Right;    There were no vitals filed for this visit.  Visit Diagnosis:  Weakness of both legs  Abnormality of gait      Subjective Assessment - 09/28/14 1125    Subjective Pt is a 69 y.o. male who presents to PT s/p and Rt hip replacement on 08/21/14. No complains of pain in Rt hip, he reports good compliance with HEP and start feeling stronger.                         [facility] Adult PT Treatment/Exercise - 09/28/14 0001    Bed Mobility   Bed Mobility --  MD visit Sept 1st   Exercises   Exercises Knee/Hip;Shoulder   Knee/Hip Exercises: Stretches   Quad Stretch 3 reps;20 seconds  with each leg   Knee/Hip Exercises: Aerobic   Nustep seat #15, arms #12 x 36min, level1   Knee/Hip Exercises: Standing   Hip ADduction Strengthening;2 sets  3# added, may incr weight next time   Hip Extension Stengthening;2 sets;10 reps  3# added, may incr weight next time   Forward Step Up 2 sets;10 reps;Hand Hold: 2   Other Standing Knee Exercises Trampoline 3 directions x 1 min each   Knee/Hip Exercises: Seated   Long Arc Quad Strengthening;Both;3 sets;10 reps   Long Arc Quad Weight 3 lbs.   Sit to Sand 2 sets;10 reps  sitting on one blue pillow, no A needed today   Shoulder Exercises: Standing   Extension Strengthening;20 reps;Weights  55 on pulls   Other Standing Exercises standing fot bil hip stabilization 35# pulling back horizontal grip  2x10 in stride                   PT Short Term Goals - 09/28/14 1146    PT SHORT TERM GOAL #1   Title be  independent in initial HEP   Time 4   Period Weeks   Status Achieved   PT SHORT TERM GOAL #2   Title perform sit to stand with 25% less UE support   Time 4   Period Weeks   Status Achieved   PT SHORT TERM GOAL #3   Title walk for 15 minutes for exercise without need to rest   Time 4   Period Weeks   Status Achieved           PT Long Term Goals - 09/28/14 1148    PT LONG TERM GOAL #1   Title be independent in advanced HEP   Time 8   Period Weeks   Status On-going   PT LONG TERM GOAL #2   Title reduce FOTO to < or = to 40% limitation   Time 8   Period Weeks   Status On-going   PT LONG TERM GOAL #3   Title walk for 30 minutes without rest   Time 8   Period Weeks   Status On-going   PT LONG TERM GOAL #4   Title return to regular exercise routine without limitation   Time 8   Period Weeks   Status On-going               Plan - 09/28/14 1145    Clinical Impression Statement Pt demonstrates improved strength and motor control as  observed in transition sit to stand, today able to perform without assist. Pt will continue to benefit from skilled PT to address strength and hip stability.   Pt will benefit from skilled therapeutic intervention in order to improve on the following deficits Abnormal gait;Difficulty walking;Obesity;Decreased activity tolerance;Decreased strength;Decreased endurance   Rehab Potential Good   PT Frequency 2x / week   PT Duration 8 weeks   PT Treatment/Interventions ADLs/Self Care Home Management;Cryotherapy;Electrical Stimulation;Functional mobility training;Stair training;Gait training;Moist Heat;Therapeutic activities;Therapeutic exercise;Neuromuscular re-education;Patient/family education;Passive range of motion;Scar mobilization;Manual techniques   PT Next Visit Plan Continue with LE strength, sit to stand, step training, NU Step seat # 15, arms #12   Consulted and Agree with Plan of Care Patient        Problem List Patient Active Problem List   Diagnosis Date Noted  . Osteoarthritis of right hip 08/21/2014  . Status post total replacement of right hip 08/21/2014  . Obesity   . Osteoarthritis   . Migraines   . Cellulitis   . Transient ischemic attack (TIA)   . Diastolic dysfunction   . Left ventricular hypertrophy     NAUMANN-HOUEGNIFIO,Torien Ramroop PTA 09/28/2014, 11:52 AM  [facility] [address] [place], [place], [address] Phone: [phone]   Fax:  [phone]

## 2014-09-30 ENCOUNTER — Ambulatory Visit: Payer: Medicare Other

## 2014-09-30 DIAGNOSIS — R269 Unspecified abnormalities of gait and mobility: Secondary | ICD-10-CM | POA: Diagnosis not present

## 2014-09-30 DIAGNOSIS — R29898 Other symptoms and signs involving the musculoskeletal system: Secondary | ICD-10-CM | POA: Diagnosis not present

## 2014-09-30 NOTE — Therapy (Signed)
[facility] [address] [place], [place], [address] Phone: [phone]   Fax:  [phone]  Physical Therapy Treatment  Patient Details  Name: Casey Parker MRN: [id number] Date of Birth: 28-Mar-1945 Referring Provider:  Mcarthur Rossetti*  Encounter Date: 09/30/2014      PT End of Session - 09/30/14 1140    Visit Number 7   Date for PT Re-Evaluation 11/04/14   PT Start Time 1101   PT Stop Time 1143   PT Time Calculation (min) 42 min   Activity Tolerance Patient tolerated treatment well   Behavior During Therapy [facility] for tasks assessed/performed      Past Medical History  Diagnosis Date  . Hyperlipidemia   . Obesity   . Hypertension   . Osteoarthritis   . Migraines     H/O  . Thyroid disease     hypo  . Cellulitis     recurrent  . Transient ischemic attack (TIA)     presumed  . Diastolic dysfunction   . Left ventricular hypertrophy   . DVT (deep venous thrombosis)     20 yrs ago  . Stroke     11-2008 no residual problems  . Enlarged prostate     Past Surgical History  Procedure Laterality Date  . Hernia repair    . Hand surgery      right  . Tonsillectomy    . Total hip arthroplasty Right 08/21/2014    Procedure: RIGHT TOTAL HIP ARTHROPLASTY ANTERIOR APPROACH;  Surgeon: Mcarthur Rossetti, MD;  Location: [facility];  Service: Orthopedics;  Laterality: Right;    There were no vitals filed for this visit.  Visit Diagnosis:  Weakness of both legs  Abnormality of gait      Subjective Assessment - 09/30/14 1111    Subjective Pt is going to see Dr Ninfa Linden tomorrow.  Pt is feeling good.  Improved ability to perform sit to stand.   Currently in Pain? No/denies            [facility] PT Assessment - 09/30/14 0001    Assessment   Medical Diagnosis s/p Rt direct anterior hip replacement   Onset Date/Surgical Date 08/21/14   Prior Function   Level of Independence Independent   Vocation  Part time employment   Cognition   Overall Cognitive Status Within Functional Limits for tasks assessed   Observation/Other Assessments   Focus on Therapeutic Outcomes (FOTO)  23% limitation                     [facility] Adult PT Treatment/Exercise - 09/30/14 0001    Knee/Hip Exercises: Standing   Hip ADduction --   Hip Abduction Stengthening;Both;2 sets;10 reps   Abduction Limitations 4# added   Hip Extension Stengthening;2 sets;10 reps  4# added, may incr weight next time   Knee/Hip Exercises: Seated   Long Arc Quad Strengthening;Both;3 sets;10 reps   Long Arc Quad Weight 4 lbs.   Shoulder Exercises: Seated   Horizontal ABduction Strengthening;20 reps;Theraband   Theraband Level (Shoulder Horizontal ABduction) Other (comment)  black.     Horizontal ABduction Limitations pain in Rt shoulder   Shoulder Exercises: Standing   Other Standing Exercises Lat pull down standing 35# x 30   Shoulder Exercises: ROM/Strengthening   UBE (Upper Arm Bike) standing Level 2x 8 minutes (4/4)                  PT Short Term Goals -  09/28/14 1146    PT SHORT TERM GOAL #1   Title be independent in initial HEP   Time 4   Period Weeks   Status Achieved   PT SHORT TERM GOAL #2   Title perform sit to stand with 25% less UE support   Time 4   Period Weeks   Status Achieved   PT SHORT TERM GOAL #3   Title walk for 15 minutes for exercise without need to rest   Time 4   Period Weeks   Status Achieved           PT Long Term Goals - 09/30/14 1113    PT LONG TERM GOAL #1   Title be independent in advanced HEP   Time 8   Period Weeks   Status On-going  independent and compliant with HEP   PT LONG TERM GOAL #2   Title reduce FOTO to < or = to 40% limitation   Time 8   Period Weeks   Status Achieved  23% limitation   PT LONG TERM GOAL #3   Title walk for 30 minutes without rest   Time 8   Period Weeks   Status On-going  use of cart, 1 hour.  Too hot to walk  outside   PT LONG TERM GOAL #4   Title return to regular exercise routine without limitation   Time 8   Period Weeks   Status On-going  some limits due to endurance deficits               Plan - 09/30/14 1118    Clinical Impression Statement Pt with improving endurance.  Pt is able to perform sit to stand with increased ease from standard height chair.  Pt is independent and compliant with HEP for strength.  FOTO score is 23% limitation today.  Pt is most limited by knee pain.  Pt would like to lose more weight before having knee replacements.   Pt will benefit from skilled therapeutic intervention in order to improve on the following deficits Abnormal gait;Difficulty walking;Obesity;Decreased activity tolerance;Decreased strength;Decreased endurance   Rehab Potential Good   PT Frequency 2x / week   PT Duration 8 weeks   PT Treatment/Interventions ADLs/Self Care Home Management;Cryotherapy;Electrical Stimulation;Functional mobility training;Stair training;Gait training;Moist Heat;Therapeutic activities;Therapeutic exercise;Neuromuscular re-education;Patient/family education;Passive range of motion;Scar mobilization;Manual techniques   PT Next Visit Plan Continue with LE strength, sit to stand, step training, NU Step seat # 15, arms #12.  Address UE strength to improve ability and ease with sit to stand transition as knees are limiting at this time.     Consulted and Agree with Plan of Care Patient        Problem List Patient Active Problem List   Diagnosis Date Noted  . Osteoarthritis of right hip 08/21/2014  . Status post total replacement of right hip 08/21/2014  . Obesity   . Osteoarthritis   . Migraines   . Cellulitis   . Transient ischemic attack (TIA)   . Diastolic dysfunction   . Left ventricular hypertrophy     TAKACS,KELLY, PT 09/30/2014, 11:42 AM  [facility] [address] [place], [place],  [address] Phone: [phone]   Fax:  [phone]

## 2014-10-09 DIAGNOSIS — I10 Essential (primary) hypertension: Secondary | ICD-10-CM | POA: Diagnosis not present

## 2014-10-09 DIAGNOSIS — I872 Venous insufficiency (chronic) (peripheral): Secondary | ICD-10-CM | POA: Diagnosis not present

## 2014-10-09 DIAGNOSIS — Z23 Encounter for immunization: Secondary | ICD-10-CM | POA: Diagnosis not present

## 2014-10-09 DIAGNOSIS — M1990 Unspecified osteoarthritis, unspecified site: Secondary | ICD-10-CM | POA: Diagnosis not present

## 2014-10-09 DIAGNOSIS — Z6841 Body Mass Index (BMI) 40.0 and over, adult: Secondary | ICD-10-CM | POA: Diagnosis not present

## 2014-10-09 DIAGNOSIS — E039 Hypothyroidism, unspecified: Secondary | ICD-10-CM | POA: Diagnosis not present

## 2014-10-09 DIAGNOSIS — E785 Hyperlipidemia, unspecified: Secondary | ICD-10-CM | POA: Diagnosis not present

## 2014-10-09 DIAGNOSIS — G4733 Obstructive sleep apnea (adult) (pediatric): Secondary | ICD-10-CM | POA: Diagnosis not present

## 2014-10-13 ENCOUNTER — Ambulatory Visit: Payer: Medicare Other | Attending: Orthopaedic Surgery

## 2014-10-13 DIAGNOSIS — R29898 Other symptoms and signs involving the musculoskeletal system: Secondary | ICD-10-CM | POA: Diagnosis not present

## 2014-10-13 DIAGNOSIS — R269 Unspecified abnormalities of gait and mobility: Secondary | ICD-10-CM

## 2014-10-13 NOTE — Therapy (Signed)
[facility] [address] [place], [place], [address] Phone: [phone]   Fax:  [phone]  Physical Therapy Treatment  Patient Details  Name: Casey Parker MRN: [id number] Date of Birth: 12/05/1945 Referring Provider:  Mcarthur Rossetti*  Encounter Date: 10/13/2014      PT End of Session - 10/13/14 1305    Visit Number 8   Date for PT Re-Evaluation 11/04/14   PT Start Time 1227   PT Stop Time 1314   PT Time Calculation (min) 47 min   Activity Tolerance Patient tolerated treatment well   Behavior During Therapy [facility] for tasks assessed/performed      Past Medical History  Diagnosis Date  . Hyperlipidemia   . Obesity   . Hypertension   . Osteoarthritis   . Migraines     H/O  . Thyroid disease     hypo  . Cellulitis     recurrent  . Transient ischemic attack (TIA)     presumed  . Diastolic dysfunction   . Left ventricular hypertrophy   . DVT (deep venous thrombosis)     20 yrs ago  . Stroke     11-2008 no residual problems  . Enlarged prostate     Past Surgical History  Procedure Laterality Date  . Hernia repair    . Hand surgery      right  . Tonsillectomy    . Total hip arthroplasty Right 08/21/2014    Procedure: RIGHT TOTAL HIP ARTHROPLASTY ANTERIOR APPROACH;  Surgeon: Mcarthur Rossetti, MD;  [facility]: [facility];  Service: Orthopedics;  Laterality: Right;    There were no vitals filed for this visit.  Visit Diagnosis:  Weakness of both legs  Abnormality of gait      Subjective Assessment - 10/13/14 1240    Subjective Saw MD 2 weeks ago.  He was pleased with the progress.     Currently in Pain? No/denies                         [facility] Adult PT Treatment/Exercise - 10/13/14 0001    Knee/Hip Exercises: Aerobic   Nustep seat #15, arms #12 x 28min, level 1   Knee/Hip Exercises: Standing   Hip Abduction Stengthening;Both;2 sets;10 reps   Abduction Limitations 5#  added   Hip Extension Stengthening;2 sets;10 reps  5# added   Other Standing Knee Exercises quad stretch on the step x10   Knee/Hip Exercises: Seated   Long Arc Quad Strengthening;Both;3 sets;10 reps;2 sets   Long Arc Quad Weight 5 lbs.   Hamstring Curl Strengthening;Both;10 reps;4 sets   Hamstring Limitations 5   Shoulder Exercises: Standing   Row Both;20 reps   Row Weight (lbs) 35# x 40 reps   Other Standing Exercises Lat pull down standing 35# x 30   Shoulder Exercises: ROM/Strengthening   UBE (Upper Arm Bike) standing Level 2x 8 minutes (4/4)                  PT Short Term Goals - 10/13/14 1242    PT SHORT TERM GOAL #1   Title be independent in initial HEP   Status Achieved           PT Long Term Goals - 10/13/14 1242    PT LONG TERM GOAL #1   Title be independent in advanced HEP   Time 8   Period Weeks   Status On-going  independent in current  HEP   PT LONG TERM GOAL #2   Title reduce FOTO to < or = to 40% limitation   Status Achieved   PT LONG TERM GOAL #3   Title walk for 30 minutes without rest   Time 8   Period Weeks   Status On-going  able to walk in the store with cart               Plan - 10/13/14 1252    Clinical Impression Statement Pt with improving endurance.  Pt tolerated increased weight (5#) today with long arc quads and standing hip exercises.  Pt is independent and compliant with HEP for strength.  Pt is limited most by knee pain.  Pt would like to lose more weight and increase LE strength and endurance before considering surgery.  Pt will benefit from skilled PT for LE strength and endurance gains.     Pt will benefit from skilled therapeutic intervention in order to improve on the following deficits Abnormal gait;Difficulty walking;Obesity;Decreased activity tolerance;Decreased strength;Decreased endurance   Rehab Potential Good   PT Frequency 2x / week   PT Duration 8 weeks   PT Treatment/Interventions ADLs/Self Care Home  Management;Cryotherapy;Electrical Stimulation;Functional mobility training;Stair training;Gait training;Moist Heat;Therapeutic activities;Therapeutic exercise;Neuromuscular re-education;Patient/family education;Passive range of motion;Scar mobilization;Manual techniques   PT Next Visit Plan Strength and endurance progression including UE strength to assist with transfers and mobility due to painful and weak knees.     Consulted and Agree with Plan of Care Patient        Problem List Patient Active Problem List   Diagnosis Date Noted  . Osteoarthritis of right hip 08/21/2014  . Status post total replacement of right hip 08/21/2014  . Obesity   . Osteoarthritis   . Migraines   . Cellulitis   . Transient ischemic attack (TIA)   . Diastolic dysfunction   . Left ventricular hypertrophy     Janis Cuffe, PT 10/13/2014, 1:06 PM  [facility] [address] [place], [place], [address] Phone: [phone]   Fax:  [phone]

## 2014-10-14 ENCOUNTER — Ambulatory Visit: Payer: Medicare Other | Admitting: Physical Therapy

## 2014-10-14 ENCOUNTER — Encounter: Payer: Self-pay | Admitting: Physical Therapy

## 2014-10-14 DIAGNOSIS — R29898 Other symptoms and signs involving the musculoskeletal system: Secondary | ICD-10-CM | POA: Diagnosis not present

## 2014-10-14 DIAGNOSIS — R269 Unspecified abnormalities of gait and mobility: Secondary | ICD-10-CM | POA: Diagnosis not present

## 2014-10-14 NOTE — Therapy (Signed)
[facility] [address] [place], [place], [address] Phone: [phone]   Fax:  [phone]  Physical Therapy Treatment  Patient Details  Name: Casey Parker MRN: [id number] Date of Birth: August 16, 1945 Referring Provider:  Mcarthur Rossetti*  Encounter Date: 10/14/2014      PT End of Session - 10/14/14 1115    Visit Number 9   Date for PT Re-Evaluation 11/04/14   PT Start Time 1045   PT Stop Time 1130   PT Time Calculation (min) 45 min   Activity Tolerance Patient tolerated treatment well   Behavior During Therapy [facility] for tasks assessed/performed      Past Medical History  Diagnosis Date  . Hyperlipidemia   . Obesity   . Hypertension   . Osteoarthritis   . Migraines     H/O  . Thyroid disease     hypo  . Cellulitis     recurrent  . Transient ischemic attack (TIA)     presumed  . Diastolic dysfunction   . Left ventricular hypertrophy   . DVT (deep venous thrombosis)     20 yrs ago  . Stroke     11-2008 no residual problems  . Enlarged prostate     Past Surgical History  Procedure Laterality Date  . Hernia repair    . Hand surgery      right  . Tonsillectomy    . Total hip arthroplasty Right 08/21/2014    Procedure: RIGHT TOTAL HIP ARTHROPLASTY ANTERIOR APPROACH;  Surgeon: Mcarthur Rossetti, MD;  Location: [facility];  Service: Orthopedics;  Laterality: Right;    There were no vitals filed for this visit.  Visit Diagnosis:  Weakness of both legs  Abnormality of gait      Subjective Assessment - 10/14/14 1104    Subjective Saw MD 2 weeks ago, he was pleased with the progress. Pt is not more using his cane with ambulation due to incr strength. The knees are hurting   Currently in Pain? No/denies                         [facility] Adult PT Treatment/Exercise - 10/14/14 0001    Exercises   Exercises Knee/Hip;Shoulder   Knee/Hip Exercises: Public affairs consultant Other  (comment)  20 reps each leg no holds, on stairs   Knee/Hip Exercises: Aerobic   Nustep seat #15, arms #12 x 56min, level 1   Knee/Hip Exercises: Machines for Strengthening   Total Gym Leg Press attempted leg press, pt was able to sit, but due to decr. ROM hip and Knees unable to place feet on foot plate  will try again in two weeks   Knee/Hip Exercises: Standing   Hip Abduction Stengthening;Both;2 sets;10 reps   Abduction Limitations 5# added   Hip Extension Stengthening;2 sets;10 reps  5#   Knee/Hip Exercises: Seated   Long Arc Quad Strengthening;Both;3 sets;10 reps;2 sets   Long Arc Quad Weight 5 lbs.   Hamstring Curl Strengthening;Both;10 reps;4 sets   Hamstring Limitations 5   Shoulder Exercises: Standing   Row Both;20 reps   Row Weight (lbs) 35# x 40 reps   Other Standing Exercises Lat pull down standing 35# x 30   Shoulder Exercises: ROM/Strengthening   UBE (Upper Arm Bike) standing Level 2x 8 minutes (4/4)                  PT Short Term Goals -  10/13/14 1242    PT SHORT TERM GOAL #1   Title be independent in initial HEP   Status Achieved           PT Long Term Goals - 10/13/14 1242    PT LONG TERM GOAL #1   Title be independent in advanced HEP   Time 8   Period Weeks   Status On-going  independent in current HEP   PT LONG TERM GOAL #2   Title reduce FOTO to < or = to 40% limitation   Status Achieved   PT LONG TERM GOAL #3   Title walk for 30 minutes without rest   Time 8   Period Weeks   Status On-going  able to walk in the store with cart               Plan - 10/14/14 1116    Clinical Impression Statement Pt with improved strength and endurance. Pt with imporved transfers sit to stand due to increased strength.    Pt will benefit from skilled therapeutic intervention in order to improve on the following deficits Abnormal gait;Difficulty walking;Obesity;Decreased activity tolerance;Decreased strength;Decreased endurance   Rehab  Potential Good   PT Frequency 2x / week   PT Duration 8 weeks   PT Treatment/Interventions ADLs/Self Care Home Management;Cryotherapy;Electrical Stimulation;Functional mobility training;Stair training;Gait training;Moist Heat;Therapeutic activities;Therapeutic exercise;Neuromuscular re-education;Patient/family education;Passive range of motion;Scar mobilization;Manual techniques   PT Next Visit Plan FOTO and G Code for 10 th visit. Continue strength  and endurance progression including UE strength to assist with transfers and mobility due to painful and weak knees.     Consulted and Agree with Plan of Care Patient        Problem List Patient Active Problem List   Diagnosis Date Noted  . Osteoarthritis of right hip 08/21/2014  . Status post total replacement of right hip 08/21/2014  . Obesity   . Osteoarthritis   . Migraines   . Cellulitis   . Transient ischemic attack (TIA)   . Diastolic dysfunction   . Left ventricular hypertrophy     NAUMANN-HOUEGNIFIO,Hyman Crossan PTA 10/14/2014, 11:41 AM  [facility] [address] [place], [place], [address] Phone: [phone]   Fax:  [phone]

## 2014-10-19 ENCOUNTER — Ambulatory Visit: Payer: Medicare Other

## 2014-10-19 DIAGNOSIS — R269 Unspecified abnormalities of gait and mobility: Secondary | ICD-10-CM | POA: Diagnosis not present

## 2014-10-19 DIAGNOSIS — R29898 Other symptoms and signs involving the musculoskeletal system: Secondary | ICD-10-CM

## 2014-10-19 NOTE — Therapy (Signed)
[facility] [address], [place], [place], [address] Phone: [phone]   Fax:  [phone]  Physical Therapy Treatment  Patient Details  Name: Casey Parker MRN: [id number] Date of Birth: 1945-08-01 Referring Provider:  Mcarthur Rossetti*  Encounter Date: 10/19/2014      PT End of Session - 10/19/14 1124    Visit Number 10   Number of Visits 10   Date for PT Re-Evaluation 11/04/14   PT Start Time [id number]   PT Stop Time 1138   PT Time Calculation (min) 42 min   Activity Tolerance Patient tolerated treatment well   Behavior During Therapy [facility] for tasks assessed/performed      Past Medical History  Diagnosis Date  . Hyperlipidemia   . Obesity   . Hypertension   . Osteoarthritis   . Migraines     H/O  . Thyroid disease     hypo  . Cellulitis     recurrent  . Transient ischemic attack (TIA)     presumed  . Diastolic dysfunction   . Left ventricular hypertrophy   . DVT (deep venous thrombosis)     20 yrs ago  . Stroke     11-2008 no residual problems  . Enlarged prostate     Past Surgical History  Procedure Laterality Date  . Hernia repair    . Hand surgery      right  . Tonsillectomy    . Total hip arthroplasty Right 08/21/2014    Procedure: RIGHT TOTAL HIP ARTHROPLASTY ANTERIOR APPROACH;  Surgeon: Mcarthur Rossetti, MD;  [facility];  Service: Orthopedics;  Laterality: Right;    There were no vitals filed for this visit.  Visit Diagnosis:  Weakness of both legs  Abnormality of gait      Subjective Assessment - 10/19/14 1104    Subjective Pt is doing well with exercises at home.     Currently in Pain? No/denies            [facility] PT Assessment - 10/19/14 0001    Assessment   Medical Diagnosis s/p Rt direct anterior hip replacement   Onset Date/Surgical Date 08/21/14   Prior Function   Level of Independence Independent   Vocation Part time employment   Cognition   Overall Cognitive Status Within Functional Limits for tasks assessed   Observation/Other Assessments   Focus on Therapeutic Outcomes (FOTO)  23% limitation                     [facility] Adult PT Treatment/Exercise - 10/19/14 0001    Knee/Hip Exercises: Public affairs consultant Other (comment)  20 reps each leg no holds, on stairs   Knee/Hip Exercises: Aerobic   Nustep seat #15, arms #12 x 65min, level 3   Knee/Hip Exercises: Standing   Hip Abduction Stengthening;Both;2 sets;10 reps   Abduction Limitations 5# added   Hip Extension Stengthening;2 sets;10 reps  5#   Knee/Hip Exercises: Seated   Long Arc Quad Strengthening;Both;3 sets;10 reps;2 sets   Long Arc Quad Weight 5 lbs.   Hamstring Curl Strengthening;Both;10 reps;4 sets   Hamstring Limitations 5   Shoulder Exercises: Standing   Row Both   Row Weight (lbs) 35# x 40 reps   Other Standing Exercises Lat pull down standing 35# x 50   Shoulder Exercises: ROM/Strengthening   UBE (Upper Arm Bike) standing Level 2x 8 minutes (4/4)  PT Short Term Goals - 10/13/14 1242    PT SHORT TERM GOAL #1   Title be independent in initial HEP   Status Achieved           PT Long Term Goals - 11/14/14 1115    PT LONG TERM GOAL #1   Title be independent in advanced HEP   Time 8   Period Weeks   Status On-going   PT LONG TERM GOAL #2   Title reduce FOTO to < or = to 40% limitation   Status Achieved   PT LONG TERM GOAL #3   Title walk for 30 minutes without rest   Time 8   Period Weeks   Status On-going  not doing this without cart due to heat outside   PT LONG TERM GOAL #4   Title return to regular exercise routine without limitation   Time 8   Period Weeks   Status On-going  pt reports endurance deficits that limit full workout               Plan - 2014/11/14 1117    Clinical Impression Statement Pt able to tolerate increased weight/reps with exercise in the clinic. Pt requires  verbal cues to reduce speed and perform without subsitution.  Pt will benefit from skilled PT for advancement of exercise to allow for strength and endurance progression to aid in inproved mobility.     Pt will benefit from skilled therapeutic intervention in order to improve on the following deficits Abnormal gait;Difficulty walking;Obesity;Decreased activity tolerance;Decreased strength;Decreased endurance   Rehab Potential Good   PT Frequency 2x / week   PT Duration 8 weeks   PT Treatment/Interventions ADLs/Self Care Home Management;Cryotherapy;Electrical Stimulation;Functional mobility training;Stair training;Gait training;Moist Heat;Therapeutic activities;Therapeutic exercise;Neuromuscular re-education;Patient/family education;Passive range of motion;Scar mobilization;Manual techniques   PT Next Visit Plan  Continue strength  and endurance progression including UE strength to assist with transfers and mobility due to painful and weak knees.     Consulted and Agree with Plan of Care Patient          G-Codes - 11-14-14 1124    Functional Assessment Tool Used FOTO: 23% limitation   Functional Limitation Mobility: Walking and moving around   Mobility: Walking and Moving Around Current Status [phone]) At least 20 percent but less than 40 percent impaired, limited or restricted   Mobility: Walking and Moving Around Goal Status [phone]) At least 20 percent but less than 40 percent impaired, limited or restricted      Problem List Patient Active Problem List   Diagnosis Date Noted  . Osteoarthritis of right hip 08/21/2014  . Status post total replacement of right hip 08/21/2014  . Obesity   . Osteoarthritis   . Migraines   . Cellulitis   . Transient ischemic attack (TIA)   . Diastolic dysfunction   . Left ventricular hypertrophy   Physical Therapy Progress Note  Dates of Reporting Period: 09/09/14 to 11/14/2014  Objective Reports of Subjective Statement: Pt reports that he is feeling  stronger and has more endurance each day.  Able to do more repetitions of exercises for HEP.    Objective Measurements: FOTO score 23% limitation.   Goal Update: See above.    Plan: Continue to build strength and endurance to allow for more tolerance for standing activity and increased ease for sit to stand.  Reason Skilled Services are Required: Pt with limited standing and walking due to pain and weakness in LEs.  Pt will benefit from  skilled PT for safe advancement of exercise to improve endurance and ease with with mobility.   TAKACS,KELLY, PT 10/19/2014, 11:27 AM  [facility] [address] [place], [place], [address] Phone: [phone]   Fax:  [phone]

## 2014-10-21 ENCOUNTER — Ambulatory Visit: Payer: Medicare Other

## 2014-10-21 DIAGNOSIS — R29898 Other symptoms and signs involving the musculoskeletal system: Secondary | ICD-10-CM | POA: Diagnosis not present

## 2014-10-21 DIAGNOSIS — R269 Unspecified abnormalities of gait and mobility: Secondary | ICD-10-CM

## 2014-10-21 NOTE — Therapy (Addendum)
[facility] [address] [place], [place], [address] Phone: [phone]   Fax:  [phone]  Physical Therapy Treatment  Patient Details  Name: Casey Parker MRN: [id number] Date of Birth: [birth year] Referring Provider:  Mcarthur Rossetti*  Encounter Date: 10/21/2014      PT End of Session - 10/21/14 1120    Visit Number 11   Number of Visits 20   Date for PT Re-Evaluation 11/04/14   PT Start Time 1046   PT Stop Time 1138   PT Time Calculation (min) 52 min   Activity Tolerance Patient tolerated treatment well   Behavior During Therapy [facility] for tasks assessed/performed      Past Medical History  Diagnosis Date  . Hyperlipidemia   . Obesity   . Hypertension   . Osteoarthritis   . Migraines     H/O  . Thyroid disease     hypo  . Cellulitis     recurrent  . Transient ischemic attack (TIA)     presumed  . Diastolic dysfunction   . Left ventricular hypertrophy   . DVT (deep venous thrombosis)     20 yrs ago  . Stroke     11-2008 no residual problems  . Enlarged prostate     Past Surgical History  Procedure Laterality Date  . Hernia repair    . Hand surgery      right  . Tonsillectomy    . Total hip arthroplasty Right 08/21/2014    Procedure: RIGHT TOTAL HIP ARTHROPLASTY ANTERIOR APPROACH;  Surgeon: Mcarthur Rossetti, MD;  Location: [facility];  Service: Orthopedics;  Laterality: Right;    There were no vitals filed for this visit.  Visit Diagnosis:  Weakness of both legs  Abnormality of gait      Subjective Assessment - 10/21/14 1103    Subjective Pt would like to do gym exercises after D/C from PT.  Discussing options with patient.     Currently in Pain? No/denies                         [facility] Adult PT Treatment/Exercise - 10/21/14 0001    Knee/Hip Exercises: Aerobic   Nustep seat #15, arms #12 x 16.5 min, level 3 (1 mile)   Knee/Hip Exercises: Standing   Hip  Abduction Stengthening;Both;2 sets;10 reps   Abduction Limitations 5# added   Hip Extension Stengthening;2 sets;10 reps  5#   Knee/Hip Exercises: Seated   Long Arc Quad Strengthening;Both;3 sets;10 reps;2 sets   Long Arc Quad Weight 5 lbs.   Hamstring Curl Strengthening;Both;10 reps;4 sets   Hamstring Limitations 5   Shoulder Exercises: Standing   Row Both   Row Weight (lbs) 35# x 50 reps   Other Standing Exercises Lat pull down standing 35# x 50   Shoulder Exercises: ROM/Strengthening   UBE (Upper Arm Bike) standing Level 3 x 10 minutes (5/5)                  PT Short Term Goals - 10/13/14 1242    PT SHORT TERM GOAL #1   Title be independent in initial HEP   Status Achieved           PT Long Term Goals - 10/19/14 1115    PT LONG TERM GOAL #1   Title be independent in advanced HEP   Time 8   Period Weeks   Status On-going   PT LONG  TERM GOAL #2   Title reduce FOTO to < or = to 40% limitation   Status Achieved   PT LONG TERM GOAL #3   Title walk for 30 minutes without rest   Time 8   Period Weeks   Status On-going  not doing this without cart due to heat outside   PT LONG TERM GOAL #4   Title return to regular exercise routine without limitation   Time 8   Period Weeks   Status On-going  pt reports endurance deficits that limit full workout               Plan - 10/21/14 1109    Clinical Impression Statement Pt able to do extra weight with row and lat pull down and increased time in the NuStep and arm bike.  Pt requires verbal cues to reduce speed and perform without substitution.  Pt will beneift from skilled PT for advancement of exercise to allow for strength and endurance progression to aid in improved mobility. Pt will pursue gym exercise at gym.     Pt will benefit from skilled therapeutic intervention in order to improve on the following deficits Abnormal gait;Difficulty walking;Obesity;Decreased activity tolerance;Decreased  strength;Decreased endurance   Rehab Potential Good   PT Frequency 2x / week   PT Duration 8 weeks   PT Treatment/Interventions ADLs/Self Care Home Management;Cryotherapy;Electrical Stimulation;Functional mobility training;Stair training;Gait training;Moist Heat;Therapeutic activities;Therapeutic exercise;Neuromuscular re-education;Patient/family education;Passive range of motion;Scar mobilization;Manual techniques   PT Next Visit Plan  Continue strength  and endurance progression including UE strength to assist with transfers and mobility due to painful and weak knees.     Consulted and Agree with Plan of Care Patient        Problem List Patient Active Problem List   Diagnosis Date Noted  . Osteoarthritis of right hip 08/21/2014  . Status post total replacement of right hip 08/21/2014  . Obesity   . Osteoarthritis   . Migraines   . Cellulitis   . Transient ischemic attack (TIA)   . Diastolic dysfunction   . Left ventricular hypertrophy     Zyion Leidner, PT 10/21/2014, 11:38 AM  [facility] [address] [place], [place], [address] Phone: [phone]   Fax:  [phone]

## 2014-10-27 ENCOUNTER — Ambulatory Visit: Payer: Medicare Other | Admitting: Physical Therapy

## 2014-10-27 ENCOUNTER — Encounter: Payer: Self-pay | Admitting: Physical Therapy

## 2014-10-27 DIAGNOSIS — R269 Unspecified abnormalities of gait and mobility: Secondary | ICD-10-CM

## 2014-10-27 DIAGNOSIS — R29898 Other symptoms and signs involving the musculoskeletal system: Secondary | ICD-10-CM | POA: Diagnosis not present

## 2014-10-27 NOTE — Therapy (Signed)
[facility] [address] [place], [place], [address] Phone: [phone]   Fax:  [phone]  Physical Therapy Treatment  Patient Details  Name: Casey Parker MRN: [id number] Date of Birth: Nov 17, 1945 Referring Provider:  Mcarthur Rossetti*  Encounter Date: 10/27/2014      PT End of Session - 10/27/14 0937    Visit Number 12   Number of Visits 20  Medicare   Date for PT Re-Evaluation 11/04/14   PT Start Time 0930   PT Stop Time [id number]   PT Time Calculation (min) 45 min   Activity Tolerance Patient tolerated treatment well   Behavior During Therapy [facility] for tasks assessed/performed      Past Medical History  Diagnosis Date  . Hyperlipidemia   . Obesity   . Hypertension   . Osteoarthritis   . Migraines     H/O  . Thyroid disease     hypo  . Cellulitis     recurrent  . Transient ischemic attack (TIA)     presumed  . Diastolic dysfunction   . Left ventricular hypertrophy   . DVT (deep venous thrombosis)     20 yrs ago  . Stroke     11-2008 no residual problems  . Enlarged prostate     Past Surgical History  Procedure Laterality Date  . Hernia repair    . Hand surgery      right  . Tonsillectomy    . Total hip arthroplasty Right 08/21/2014    Procedure: RIGHT TOTAL HIP ARTHROPLASTY ANTERIOR APPROACH;  Surgeon: Mcarthur Rossetti, MD;  Location: [facility];  Service: Orthopedics;  Laterality: Right;    There were no vitals filed for this visit.  Visit Diagnosis:  Weakness of both legs  Abnormality of gait      Subjective Assessment - 10/27/14 0938    Subjective I am doing exercises at home.    Pertinent History has lost 105 lbs, goal is 150 lb loss   Limitations Walking;Standing   How long can you walk comfortably? pt has walked 5-10 minutes max since surgery   Patient Stated Goals reduce stiffness, improve mobility and strength, goal to lose ~40-45 lbs   Currently in Pain? No/denies                          [facility] Adult PT Treatment/Exercise - 10/27/14 0001    Knee/Hip Exercises: Aerobic   Nustep seat #15, arms #12 x 16.5 min, level 3 (1 mile)   Knee/Hip Exercises: Standing   Hip Flexion Stengthening;Right;Left;2 sets;10 reps;Knee bent  5 pounds   Hip Abduction Stengthening;Both;2 sets;10 reps   Hip Extension Stengthening;2 sets;10 reps  5#   Knee/Hip Exercises: Seated   Long Arc Quad Strengthening;Both;3 sets;10 reps;2 sets   Long Arc Quad Weight 5 lbs.   Hamstring Curl Strengthening;Both;10 reps;4 sets   Hamstring Limitations 5   Shoulder Exercises: Standing   Row Weight (lbs) 35# x 50 reps  tactile cues to keep shoulders down   Shoulder Exercises: ROM/Strengthening   UBE (Upper Arm Bike) standing Level 3 x 10 minutes (5/5)                  PT Short Term Goals - 10/13/14 1242    PT SHORT TERM GOAL #1   Title be independent in initial HEP   Status Achieved           PT Long Term  Goals - 10/27/14 0949    PT LONG TERM GOAL #1   Title be independent in advanced HEP   Time 8   Period Weeks   Status On-going  still learning exercises   PT LONG TERM GOAL #2   Title reduce FOTO to < or = to 40% limitation   Time 8   Period Weeks   Status Achieved   PT LONG TERM GOAL #3   Title walk for 30 minutes without rest   Time 8   Period Weeks   Status Achieved   PT LONG TERM GOAL #4   Title return to regular exercise routine without limitation   Time 8   Period Weeks   Status On-going  still learning exercises               Plan - 10/27/14 0950    Clinical Impression Statement Patient is able to perform exercises without difficulty.  Patient is ready to increase weights due to being stronger.  Patient needs verbal cues to go slower. Patient has done increased time on nu step. Patient does his exercises quickly. Patient will pursue exercises at gym. Patient will benefit from physical therapy to improve strength and  educate on gym program.    Pt will benefit from skilled therapeutic intervention in order to improve on the following deficits Abnormal gait;Difficulty walking;Obesity;Decreased activity tolerance;Decreased strength;Decreased endurance   Rehab Potential Good   Clinical Impairments Affecting Rehab Potential None   PT Frequency 2x / week   PT Duration 8 weeks   PT Treatment/Interventions ADLs/Self Care Home Management;Cryotherapy;Electrical Stimulation;Functional mobility training;Stair training;Gait training;Moist Heat;Therapeutic activities;Therapeutic exercise;Neuromuscular re-education;Patient/family education;Passive range of motion;Scar mobilization;Manual techniques   PT Next Visit Plan  Continue strength  and endurance progression including UE strength to assist with transfers and mobility due to painful and weak knees.  Increase weights on legs   PT Home Exercise Plan progress  as needed   Recommended Other Services None   Consulted and Agree with Plan of Care Patient        Problem List Patient Active Problem List   Diagnosis Date Noted  . Osteoarthritis of right hip 08/21/2014  . Status post total replacement of right hip 08/21/2014  . Obesity   . Osteoarthritis   . Migraines   . Cellulitis   . Transient ischemic attack (TIA)   . Diastolic dysfunction   . Left ventricular hypertrophy     GRAY,CHERYL,PT 10/27/2014, 9:57 AM  [facility] [address], [place], [place], [address] Phone: [phone]   Fax:  [phone]

## 2014-10-28 ENCOUNTER — Encounter: Payer: Self-pay | Admitting: Physical Therapy

## 2014-10-28 ENCOUNTER — Ambulatory Visit: Payer: Medicare Other | Admitting: Physical Therapy

## 2014-10-28 DIAGNOSIS — R269 Unspecified abnormalities of gait and mobility: Secondary | ICD-10-CM | POA: Diagnosis not present

## 2014-10-28 DIAGNOSIS — R29898 Other symptoms and signs involving the musculoskeletal system: Secondary | ICD-10-CM | POA: Diagnosis not present

## 2014-10-28 NOTE — Therapy (Signed)
[facility] [address], [place], [place], [address] Phone: [phone]   Fax:  [phone]  Physical Therapy Treatment  Patient Details  Name: Casey Parker MRN: [id number] Date of Birth: 1945-11-13 Referring Provider:  Mcarthur Rossetti*  Encounter Date: 10/28/2014      PT End of Session - 10/28/14 1154    Visit Number 13   Number of Visits 20   Date for PT Re-Evaluation 11/04/14   PT Start Time 1122   PT Stop Time 1212   PT Time Calculation (min) 50 min   Activity Tolerance Patient tolerated treatment well   Behavior During Therapy [facility] for tasks assessed/performed      Past Medical History  Diagnosis Date  . Hyperlipidemia   . Obesity   . Hypertension   . Osteoarthritis   . Migraines     H/O  . Thyroid disease     hypo  . Cellulitis     recurrent  . Transient ischemic attack (TIA)     presumed  . Diastolic dysfunction   . Left ventricular hypertrophy   . DVT (deep venous thrombosis)     20 yrs ago  . Stroke     11-2008 no residual problems  . Enlarged prostate     Past Surgical History  Procedure Laterality Date  . Hernia repair    . Hand surgery      right  . Tonsillectomy    . Total hip arthroplasty Right 08/21/2014    Procedure: RIGHT TOTAL HIP ARTHROPLASTY ANTERIOR APPROACH;  Surgeon: Mcarthur Rossetti, MD;  Location: [facility];  Service: Orthopedics;  Laterality: Right;    There were no vitals filed for this visit.  Visit Diagnosis:  Weakness of both legs  Abnormality of gait      Subjective Assessment - 10/28/14 1149    Subjective Pt reports all over feeling stronger, the pain in knees is holding him back.   Currently in Pain? No/denies                         [facility] Adult PT Treatment/Exercise - 10/28/14 0001    Exercises   Exercises Knee/Hip;Shoulder   Knee/Hip Exercises: Aerobic   Nustep seat #15, arms #12 x 15 min, level 3    Knee/Hip  Exercises: Seated   Long Arc Quad Strengthening;Both;3 sets;10 reps;2 sets   [employer] Weight 6 lbs.   Hamstring Curl Strengthening;Both;10 reps;4 sets   Hamstring Limitations 6   Shoulder Exercises: Standing   Row Weight (lbs) 45# x50reps   Other Standing Exercises Lat pull down standing 35# x 50   Shoulder Exercises: ROM/Strengthening   UBE (Upper Arm Bike) standing Level 3 x 10 minutes (5/5)                  PT Short Term Goals - 10/13/14 1242    PT SHORT TERM GOAL #1   Title be independent in initial HEP   Status Achieved           PT Long Term Goals - 10/27/14 0949    PT LONG TERM GOAL #1   Title be independent in advanced HEP   Time 8   Period Weeks   Status On-going  still learning exercises   PT LONG TERM GOAL #2   Title reduce FOTO to < or = to 40% limitation   Time 8   Period Weeks   Status Achieved  PT LONG TERM GOAL #3   Title walk for 30 minutes without rest   Time 8   Period Weeks   Status Achieved   PT LONG TERM GOAL #4   Title return to regular exercise routine without limitation   Time 8   Period Weeks   Status On-going  still learning exercises               Plan - 10/28/14 1159    Clinical Impression Statement Patient presents with incr. endurance and strength as seen in incr of time on Nustep and incr of weight with activities. Pt needs verval cues for pacing. Pt is planning to transition to gym. Pt will continue to benefit from skilled PT to improve strength and endurance   Pt will benefit from skilled therapeutic intervention in order to improve on the following deficits Abnormal gait;Difficulty walking;Obesity;Decreased activity tolerance;Decreased strength;Decreased endurance   Rehab Potential Good   PT Frequency 2x / week   PT Duration 8 weeks   PT Treatment/Interventions ADLs/Self Care Home Management;Cryotherapy;Electrical Stimulation;Functional mobility training;Stair training;Gait training;Moist Heat;Therapeutic  activities;Therapeutic exercise;Neuromuscular re-education;Patient/family education;Passive range of motion;Scar mobilization;Manual techniques   PT Next Visit Plan Continue strength and endurance continue with UE strength to assist with transfers and mobility due to painful and weak knees.    PT Home Exercise Plan progress  as needed   Consulted and Agree with Plan of Care Patient        Problem List Patient Active Problem List   Diagnosis Date Noted  . Osteoarthritis of right hip 08/21/2014  . Status post total replacement of right hip 08/21/2014  . Obesity   . Osteoarthritis   . Migraines   . Cellulitis   . Transient ischemic attack (TIA)   . Diastolic dysfunction   . Left ventricular hypertrophy     NAUMANN-HOUEGNIFIO,Krist Rosenboom PTA 10/28/2014, 12:05 PM  [facility] [address] [place], [place], [address] Phone: [phone]   Fax:  [phone]

## 2014-11-04 ENCOUNTER — Ambulatory Visit: Payer: Medicare Other | Attending: Orthopaedic Surgery

## 2014-11-04 DIAGNOSIS — R29898 Other symptoms and signs involving the musculoskeletal system: Secondary | ICD-10-CM | POA: Diagnosis not present

## 2014-11-04 DIAGNOSIS — R269 Unspecified abnormalities of gait and mobility: Secondary | ICD-10-CM | POA: Diagnosis not present

## 2014-11-04 NOTE — Therapy (Addendum)
[facility] [address], [place], [place], [address] Phone: [phone]   Fax:  [phone]  Physical Therapy Treatment  Patient Details  Name: Casey Parker MRN: [id number] Date of Birth: [birth year] Referring Provider:  Mcarthur Rossetti*  Encounter Date: 11/04/2014      PT End of Session - 11/04/14 1112    Visit Number 14   PT Start Time 3329   PT Stop Time 1127   PT Time Calculation (min) 45 min   Activity Tolerance Patient tolerated treatment well   Behavior During Therapy [facility] for tasks assessed/performed      Past Medical History  Diagnosis Date  . Hyperlipidemia   . Obesity   . Hypertension   . Osteoarthritis   . Migraines     H/O  . Thyroid disease     hypo  . Cellulitis     recurrent  . Transient ischemic attack (TIA)     presumed  . Diastolic dysfunction   . Left ventricular hypertrophy   . DVT (deep venous thrombosis) ([place])     20 yrs ago  . Stroke ([place])     11-2008 no residual problems  . Enlarged prostate     Past Surgical History  Procedure Laterality Date  . Hernia repair    . Hand surgery      right  . Tonsillectomy    . Total hip arthroplasty Right 08/21/2014    Procedure: RIGHT TOTAL HIP ARTHROPLASTY ANTERIOR APPROACH;  Surgeon: Mcarthur Rossetti, MD;  Location: [facility];  Service: Orthopedics;  Laterality: Right;    There were no vitals filed for this visit.  Visit Diagnosis:  Weakness of both legs  Abnormality of gait      Subjective Assessment - 11/04/14 1051    Subjective Ready for D/C.  Feels overall stronger.  Limited by knee pain.    Currently in Pain? No/denies            [facility] PT Assessment - 11/04/14 0001    Assessment   Medical Diagnosis s/p Rt direct anterior hip replacement   Onset Date/Surgical Date 08/21/14   Prior Function   Level of Independence Independent   Vocation Part time employment   Cognition   Overall Cognitive Status  Within Functional Limits for tasks assessed   Observation/Other Assessments   Focus on Therapeutic Outcomes (FOTO)  2% limitation                     [facility] Adult PT Treatment/Exercise - 11/04/14 0001    Knee/Hip Exercises: Aerobic   Nustep seat #15, arms #12 x 15 min, level 3    Knee/Hip Exercises: Standing   Hip Flexion Stengthening;Right;Left;2 sets;10 reps;Knee bent  6# added   Hip Abduction Stengthening;Both;2 sets;10 reps   Abduction Limitations 6# added   Hip Extension Stengthening;2 sets;10 reps  6# added   Knee/Hip Exercises: Seated   Long Arc Quad Strengthening;Both;3 sets;10 reps;2 sets   Long Arc Quad Weight 6 lbs.   Hamstring Curl Strengthening;Both;10 reps;4 sets   Hamstring Limitations 6   Shoulder Exercises: Standing   Row Both   Row Weight (lbs) 45# x50reps   Other Standing Exercises Lat pull down standing 45# x 50   Shoulder Exercises: ROM/Strengthening   UBE (Upper Arm Bike) standing Level 3 x 10 minutes (5/5)                  PT Short Term Goals - 10/13/14  [place] #1   Title be independent in initial HEP   Status Achieved           PT Long Term Goals - 2014/11/15 1052    PT LONG TERM GOAL #1   Title be independent in advanced HEP   Status Achieved   PT LONG TERM GOAL #2   Title reduce FOTO to < or = to 40% limitation   Status Achieved   PT LONG TERM GOAL #3   Title walk for 30 minutes without rest   Status Achieved   PT LONG TERM GOAL #4   Title return to regular exercise routine without limitation   Status Achieved               Plan - November 15, 2014 1105    Clinical Impression Statement FOTO score is 2% limitation.  Pt has met all goals and will pursue a gym exercise program for continued gains after discharge.  Pt with bilateral knee pain and would like to have knee replacement surgery after he loses weight.     PT Next Visit Plan D/C PT to HEP/gym program   Consulted and Agree with Plan of Care  Patient          G-Codes - 15-Nov-2014 1043    Functional Assessment Tool Used FOTO: 2% limitation   Functional Limitation Mobility: Walking and moving around   Mobility: Walking and Moving Around Goal Status [phone]) At least 20 percent but less than 40 percent impaired, limited or restricted   Mobility: Walking and Moving Around Discharge Status [phone]) At least 1 percent but less than 20 percent impaired, limited or restricted      Problem List Patient Active Problem List   Diagnosis Date Noted  . Osteoarthritis of right hip 08/21/2014  . Status post total replacement of right hip 08/21/2014  . Obesity   . Osteoarthritis   . Migraines   . Cellulitis   . Transient ischemic attack (TIA)   . Diastolic dysfunction   . Left ventricular hypertrophy     Daanish Copes, PT November 15, 2014, 11:25 AM PHYSICAL THERAPY DISCHARGE SUMMARY  Visits from Start of Care: 14  Current functional level related to goals / functional outcomes: See above for current status.     Remaining deficits: Pt is limited by knee pain with all standing activity and endurance tasks.  Pt has HEP in place and gym exercise program for continued gains.     Education / Equipment:HEP, gym exercises Plan: Patient agrees to discharge.  Patient goals were met. Patient is being discharged due to meeting the stated rehab goals.  ?????   Sigurd Sos, PT 12/16/2014 3:55 PM  [facility] [address] [place], [place], [address] Phone: [phone]   Fax:  [phone]

## 2014-11-29 NOTE — Progress Notes (Signed)
Patient ID: Casey Parker, male   DOB: 07/22/1945, 69 y.o.   MRN: [id number]    DATE: 09/01/14  MRN:  [id number]  BIRTHDAY: 1945/06/26  Facility:  Nursing Home Location:  [place] Room Number: 703-P  LEVEL OF CARE:  SNF (31)  Contact Information    Name Relation Home Work [place] [phone]    No name specified          Chief Complaint  Patient presents with  . Discharge Note    Osteoarthritis S/P right total hip arthroplasty, hypertension, hypothyroidism, constipation, hyperlipidemia, frequent UTI and anemia    HISTORY OF PRESENT ILLNESS:  This is a 69 year old male who is for discharge home and will have outpatient rehabilitation. He has been admitted to [facility] on 08/24/14 from [facility] with osteoarthritis S/P right total hip replacement, anterior approach 7/22. He has PMH of hyperlipidemia, obesity, hypertension, osteoarthritis, migraines, TIA, diastolic dysfunction, DVT (LLE, 20 years ago), stroke, recurrent cellulitis and enlarged prostate.   Patient was admitted to this facility for short-term rehabilitation after the patient's recent hospitalization.  Patient has completed SNF rehabilitation and therapy has cleared the patient for discharge.  PAST MEDICAL HISTORY:  Past Medical History  Diagnosis Date  . Hyperlipidemia   . Obesity   . Hypertension   . Osteoarthritis   . Migraines     H/O  . Thyroid disease     hypo  . Cellulitis     recurrent  . Transient ischemic attack (TIA)     presumed  . Diastolic dysfunction   . Left ventricular hypertrophy   . DVT (deep venous thrombosis) ([place])     20 yrs ago  . Stroke ([place])     11-2008 no residual problems  . Enlarged prostate      CURRENT MEDICATIONS: Reviewed   Patient's Medications  New Prescriptions   No medications on file  Previous Medications   ASPIRIN 325 MG TABLET    Take 1 tablet (325 mg total) by mouth 2 (two)  times daily after a meal.   LEVOTHYROXINE (SYNTHROID, LEVOTHROID) 125 MCG TABLET    Take 125 mcg by mouth daily.   LOSARTAN (COZAAR) 100 MG TABLET    Take 100 mg by mouth daily.   METHOCARBAMOL (ROBAXIN) 500 MG TABLET    Take 1 tablet (500 mg total) by mouth every 6 (six) hours as needed for muscle spasms.   MULTIPLE VITAMIN (MULTIVITAMIN) TABLET    Take 1 tablet by mouth daily.   OMEGA-3 FATTY ACIDS (FISH OIL PO)    Take 1,600 Units by mouth daily.   OXYCODONE-ACETAMINOPHEN (ROXICET) 5-325 MG PER TABLET    Take 1-2 tablets by mouth every 4 (four) hours as needed.   PSYLLIUM (METAMUCIL) 58.6 % POWDER    Take 1 packet by mouth daily.   SIMVASTATIN (ZOCOR) 20 MG TABLET    Take 20 mg by mouth daily.    SULFAMETHOXAZOLE-TRIMETHOPRIM (SULFAMETHOXAZOLE-TMP DS PO)    Take 1 tablet by mouth daily.  Modified Medications   No medications on file  Discontinued Medications   No medications on file     Allergies  Allergen Reactions  . Augmentin [Amoxicillin-Pot Clavulanate]   . Other     bovine products -itching  . Penicillins     Pt unsure if allergic to penicillin     REVIEW OF SYSTEMS:  GENERAL: no change in appetite, no fatigue, no weight changes,  no fever, chills or weakness EYES: Denies change in vision, dry eyes, eye pain, itching or discharge EARS: Denies change in hearing, ringing in ears, or earache NOSE: Denies nasal congestion or epistaxis MOUTH and THROAT: Denies oral discomfort, gingival pain or bleeding, pain from teeth or hoarseness   RESPIRATORY: no cough, SOB, DOE, wheezing, hemoptysis CARDIAC: no chest pain, edema or palpitations GI: no abdominal pain, diarrhea, constipation, heart burn, nausea or vomiting GU: Denies dysuria, frequency, hematuria, incontinence, or discharge PSYCHIATRIC: Denies feeling of depression or anxiety. No report of hallucinations, insomnia, paranoia, or agitation   PHYSICAL EXAMINATION  GENERAL APPEARANCE: Well nourished. In no acute distress.  Normal body habitus SKIN:  Right hip surgical incision is dry, no redness HEAD: Normal in size and contour. No evidence of trauma EYES: Lids open and close normally. No blepharitis, entropion or ectropion. PERRL. Conjunctivae are clear and sclerae are white. Lenses are without opacity EARS: Pinnae are normal. Patient hears normal voice tunes of the examiner MOUTH and THROAT: Lips are without lesions. Oral mucosa is moist and without lesions. Tongue is normal in shape, size, and color and without lesions NECK: supple, trachea midline, no neck masses, no thyroid tenderness, no thyromegaly LYMPHATICS: no LAN in the neck, no supraclavicular LAN RESPIRATORY: breathing is even & unlabored, BS CTAB CARDIAC: RRR, no murmur,no extra heart sounds, no edema GI: abdomen soft, normal BS, no masses, no tenderness, no hepatomegaly, no splenomegaly MUSCULOSKELETAL: Able to move 4 extremities  PSYCHIATRIC: Alert and oriented X 3. Affect and behavior are appropriate  LABS/RADIOLOGY: Labs reviewed: Basic Metabolic Panel:  Recent Labs  08/14/14 1425 08/22/14 0446  NA 137 133*  K 4.4 4.4  CL 106 100*  CO2 25 29  GLUCOSE 109* 123*  BUN 15 10  CREATININE 1.01 0.93  CALCIUM 9.1 8.2*   CBC:  Recent Labs  08/22/14 0446 08/23/14 0538 08/24/14 0424  WBC 7.3 10.4 10.0  HGB 9.7* 9.5* 8.5*  HCT 30.2* 29.2* 25.7*  MCV 93.8 92.7 92.1  PLT 168 146* 147*    No results found.  ASSESSMENT/PLAN:  Osteoarthritis S/P right total hip replacement, anterior approach - for outpatient rehabilitation; continue aspirin 325 mg 1 tab by mouth twice a day for DVT prophylaxis; Robaxin 500 mg 1 tab by mouth every 6 hours when necessary for muscle spasm; Percocet 5/325 mg 1-2 tabs by mouth every 4 hours when necessary for pain; follow-up with Dr. Jean Rosenthal, orthopedic surgeon  Hypertension - continue losartan 100 mg 1 tab by mouth daily  Hypothyroidism - continue Synthroid 125 g 1 tab by mouth  daily  Constipation - continue Metamucil 58.6% 1 packet by mouth daily  Hyperlipidemia - continue Zocor 20 mg 1 tab by mouth daily  Frequent UTI - continue sulfamethoxazole-TMP DS 1 tab by mouth daily  Anemia, acute blood loss - hemoglobin 8.4; stable      I have filled out patient's discharge paperwork and written prescriptions.  Patient will outpatient rehabilitation.  Total discharge time: Less than 30 minutes  Discharge time involved coordination of the discharge process with Education officer, museum, nursing staff and therapy department. Medical justification for home health services/DME verified.     [facility], NP [employer] [phone]

## 2015-02-09 DIAGNOSIS — D239 Other benign neoplasm of skin, unspecified: Secondary | ICD-10-CM | POA: Diagnosis not present

## 2015-02-09 DIAGNOSIS — L309 Dermatitis, unspecified: Secondary | ICD-10-CM | POA: Diagnosis not present

## 2015-02-09 DIAGNOSIS — L821 Other seborrheic keratosis: Secondary | ICD-10-CM | POA: Diagnosis not present

## 2015-04-28 DIAGNOSIS — H2513 Age-related nuclear cataract, bilateral: Secondary | ICD-10-CM | POA: Diagnosis not present

## 2015-04-29 DIAGNOSIS — I10 Essential (primary) hypertension: Secondary | ICD-10-CM | POA: Diagnosis not present

## 2015-04-29 DIAGNOSIS — E038 Other specified hypothyroidism: Secondary | ICD-10-CM | POA: Diagnosis not present

## 2015-04-29 DIAGNOSIS — E784 Other hyperlipidemia: Secondary | ICD-10-CM | POA: Diagnosis not present

## 2015-04-29 DIAGNOSIS — Z125 Encounter for screening for malignant neoplasm of prostate: Secondary | ICD-10-CM | POA: Diagnosis not present

## 2015-05-06 DIAGNOSIS — I872 Venous insufficiency (chronic) (peripheral): Secondary | ICD-10-CM | POA: Diagnosis not present

## 2015-05-06 DIAGNOSIS — Z6841 Body Mass Index (BMI) 40.0 and over, adult: Secondary | ICD-10-CM | POA: Diagnosis not present

## 2015-05-06 DIAGNOSIS — I10 Essential (primary) hypertension: Secondary | ICD-10-CM | POA: Diagnosis not present

## 2015-05-06 DIAGNOSIS — E038 Other specified hypothyroidism: Secondary | ICD-10-CM | POA: Diagnosis not present

## 2015-05-06 DIAGNOSIS — Z1389 Encounter for screening for other disorder: Secondary | ICD-10-CM | POA: Diagnosis not present

## 2015-05-06 DIAGNOSIS — I6990 Unspecified sequelae of unspecified cerebrovascular disease: Secondary | ICD-10-CM | POA: Diagnosis not present

## 2015-05-06 DIAGNOSIS — D692 Other nonthrombocytopenic purpura: Secondary | ICD-10-CM | POA: Diagnosis not present

## 2015-05-06 DIAGNOSIS — E784 Other hyperlipidemia: Secondary | ICD-10-CM | POA: Diagnosis not present

## 2015-05-06 DIAGNOSIS — Z125 Encounter for screening for malignant neoplasm of prostate: Secondary | ICD-10-CM | POA: Diagnosis not present

## 2015-05-06 DIAGNOSIS — Z0000 Encounter for general adult medical examination without abnormal findings: Secondary | ICD-10-CM | POA: Diagnosis not present

## 2015-05-12 DIAGNOSIS — Z1212 Encounter for screening for malignant neoplasm of rectum: Secondary | ICD-10-CM | POA: Diagnosis not present

## 2015-08-25 DIAGNOSIS — M25551 Pain in right hip: Secondary | ICD-10-CM | POA: Diagnosis not present

## 2015-08-25 DIAGNOSIS — M542 Cervicalgia: Secondary | ICD-10-CM | POA: Diagnosis not present

## 2015-08-25 DIAGNOSIS — M1611 Unilateral primary osteoarthritis, right hip: Secondary | ICD-10-CM | POA: Diagnosis not present

## 2015-11-05 DIAGNOSIS — I10 Essential (primary) hypertension: Secondary | ICD-10-CM | POA: Diagnosis not present

## 2015-11-05 DIAGNOSIS — I69998 Other sequelae following unspecified cerebrovascular disease: Secondary | ICD-10-CM | POA: Diagnosis not present

## 2015-11-05 DIAGNOSIS — E038 Other specified hypothyroidism: Secondary | ICD-10-CM | POA: Diagnosis not present

## 2015-11-05 DIAGNOSIS — Z6841 Body Mass Index (BMI) 40.0 and over, adult: Secondary | ICD-10-CM | POA: Diagnosis not present

## 2015-11-05 DIAGNOSIS — I872 Venous insufficiency (chronic) (peripheral): Secondary | ICD-10-CM | POA: Diagnosis not present

## 2015-11-05 DIAGNOSIS — M1990 Unspecified osteoarthritis, unspecified site: Secondary | ICD-10-CM | POA: Diagnosis not present

## 2015-11-05 DIAGNOSIS — Z23 Encounter for immunization: Secondary | ICD-10-CM | POA: Diagnosis not present

## 2016-02-14 DIAGNOSIS — L309 Dermatitis, unspecified: Secondary | ICD-10-CM | POA: Diagnosis not present

## 2016-02-14 DIAGNOSIS — L821 Other seborrheic keratosis: Secondary | ICD-10-CM | POA: Diagnosis not present

## 2016-02-14 DIAGNOSIS — D229 Melanocytic nevi, unspecified: Secondary | ICD-10-CM | POA: Diagnosis not present

## 2016-03-30 DIAGNOSIS — K635 Polyp of colon: Secondary | ICD-10-CM | POA: Diagnosis not present

## 2016-03-30 DIAGNOSIS — Z800 Family history of malignant neoplasm of digestive organs: Secondary | ICD-10-CM | POA: Diagnosis not present

## 2016-03-30 DIAGNOSIS — Z86010 Personal history of colonic polyps: Secondary | ICD-10-CM | POA: Diagnosis not present

## 2016-03-30 DIAGNOSIS — D128 Benign neoplasm of rectum: Secondary | ICD-10-CM | POA: Diagnosis not present

## 2016-03-30 DIAGNOSIS — K621 Rectal polyp: Secondary | ICD-10-CM | POA: Diagnosis not present

## 2016-03-30 DIAGNOSIS — Z1211 Encounter for screening for malignant neoplasm of colon: Secondary | ICD-10-CM | POA: Diagnosis not present

## 2016-03-30 DIAGNOSIS — K648 Other hemorrhoids: Secondary | ICD-10-CM | POA: Diagnosis not present

## 2016-04-27 DIAGNOSIS — H2513 Age-related nuclear cataract, bilateral: Secondary | ICD-10-CM | POA: Diagnosis not present

## 2016-05-03 DIAGNOSIS — Z125 Encounter for screening for malignant neoplasm of prostate: Secondary | ICD-10-CM | POA: Diagnosis not present

## 2016-05-03 DIAGNOSIS — Z0000 Encounter for general adult medical examination without abnormal findings: Secondary | ICD-10-CM | POA: Diagnosis not present

## 2016-05-03 DIAGNOSIS — I10 Essential (primary) hypertension: Secondary | ICD-10-CM | POA: Diagnosis not present

## 2016-05-03 DIAGNOSIS — K648 Other hemorrhoids: Secondary | ICD-10-CM | POA: Diagnosis not present

## 2016-05-03 DIAGNOSIS — E785 Hyperlipidemia, unspecified: Secondary | ICD-10-CM | POA: Diagnosis present

## 2016-05-03 DIAGNOSIS — E038 Other specified hypothyroidism: Secondary | ICD-10-CM | POA: Diagnosis not present

## 2016-05-03 DIAGNOSIS — E039 Hypothyroidism, unspecified: Secondary | ICD-10-CM | POA: Insufficient documentation

## 2016-05-03 DIAGNOSIS — E784 Other hyperlipidemia: Secondary | ICD-10-CM | POA: Diagnosis not present

## 2016-05-08 DIAGNOSIS — L308 Other specified dermatitis: Secondary | ICD-10-CM | POA: Diagnosis not present

## 2016-05-08 DIAGNOSIS — I6990 Unspecified sequelae of unspecified cerebrovascular disease: Secondary | ICD-10-CM | POA: Diagnosis not present

## 2016-05-08 DIAGNOSIS — E784 Other hyperlipidemia: Secondary | ICD-10-CM | POA: Diagnosis not present

## 2016-05-08 DIAGNOSIS — I6529 Occlusion and stenosis of unspecified carotid artery: Secondary | ICD-10-CM | POA: Diagnosis not present

## 2016-05-08 DIAGNOSIS — Z0000 Encounter for general adult medical examination without abnormal findings: Secondary | ICD-10-CM | POA: Diagnosis not present

## 2016-05-08 DIAGNOSIS — M1990 Unspecified osteoarthritis, unspecified site: Secondary | ICD-10-CM | POA: Diagnosis not present

## 2016-05-08 DIAGNOSIS — E038 Other specified hypothyroidism: Secondary | ICD-10-CM | POA: Diagnosis not present

## 2016-05-08 DIAGNOSIS — D692 Other nonthrombocytopenic purpura: Secondary | ICD-10-CM | POA: Diagnosis not present

## 2016-05-08 DIAGNOSIS — Z1389 Encounter for screening for other disorder: Secondary | ICD-10-CM | POA: Diagnosis not present

## 2016-05-08 DIAGNOSIS — Z6841 Body Mass Index (BMI) 40.0 and over, adult: Secondary | ICD-10-CM | POA: Diagnosis not present

## 2016-05-08 DIAGNOSIS — I10 Essential (primary) hypertension: Secondary | ICD-10-CM | POA: Diagnosis not present

## 2016-05-17 DIAGNOSIS — K648 Other hemorrhoids: Secondary | ICD-10-CM | POA: Diagnosis not present

## 2016-05-31 DIAGNOSIS — K648 Other hemorrhoids: Secondary | ICD-10-CM | POA: Diagnosis not present

## 2016-06-20 ENCOUNTER — Telehealth: Payer: Self-pay | Admitting: Gastroenterology

## 2016-06-20 NOTE — Telephone Encounter (Signed)
Former Dr Deatra Ina pt. Due for Recall Colon Aug 2018. Saw @ Care Everywhere that he transferred his GI Care to Dr Earlean Shawl.

## 2016-11-06 DIAGNOSIS — Z23 Encounter for immunization: Secondary | ICD-10-CM | POA: Diagnosis not present

## 2016-11-06 DIAGNOSIS — I10 Essential (primary) hypertension: Secondary | ICD-10-CM | POA: Diagnosis not present

## 2016-11-06 DIAGNOSIS — E038 Other specified hypothyroidism: Secondary | ICD-10-CM | POA: Diagnosis not present

## 2016-11-06 DIAGNOSIS — Z6841 Body Mass Index (BMI) 40.0 and over, adult: Secondary | ICD-10-CM | POA: Diagnosis not present

## 2016-11-06 DIAGNOSIS — G4733 Obstructive sleep apnea (adult) (pediatric): Secondary | ICD-10-CM | POA: Diagnosis not present

## 2016-11-06 DIAGNOSIS — E7849 Other hyperlipidemia: Secondary | ICD-10-CM | POA: Diagnosis not present

## 2016-11-06 DIAGNOSIS — I6529 Occlusion and stenosis of unspecified carotid artery: Secondary | ICD-10-CM | POA: Diagnosis not present

## 2016-11-06 DIAGNOSIS — D692 Other nonthrombocytopenic purpura: Secondary | ICD-10-CM | POA: Diagnosis not present

## 2016-11-06 DIAGNOSIS — I6990 Unspecified sequelae of unspecified cerebrovascular disease: Secondary | ICD-10-CM | POA: Diagnosis not present

## 2016-11-06 DIAGNOSIS — I872 Venous insufficiency (chronic) (peripheral): Secondary | ICD-10-CM | POA: Diagnosis not present

## 2016-11-06 DIAGNOSIS — K649 Unspecified hemorrhoids: Secondary | ICD-10-CM | POA: Diagnosis not present

## 2017-03-12 DIAGNOSIS — D692 Other nonthrombocytopenic purpura: Secondary | ICD-10-CM | POA: Diagnosis not present

## 2017-03-12 DIAGNOSIS — D229 Melanocytic nevi, unspecified: Secondary | ICD-10-CM | POA: Diagnosis not present

## 2017-03-12 DIAGNOSIS — L821 Other seborrheic keratosis: Secondary | ICD-10-CM | POA: Diagnosis not present

## 2017-04-27 DIAGNOSIS — H2513 Age-related nuclear cataract, bilateral: Secondary | ICD-10-CM | POA: Diagnosis not present

## 2017-05-03 DIAGNOSIS — E7849 Other hyperlipidemia: Secondary | ICD-10-CM | POA: Diagnosis not present

## 2017-05-03 DIAGNOSIS — I10 Essential (primary) hypertension: Secondary | ICD-10-CM | POA: Diagnosis not present

## 2017-05-03 DIAGNOSIS — R82998 Other abnormal findings in urine: Secondary | ICD-10-CM | POA: Diagnosis not present

## 2017-05-03 DIAGNOSIS — E038 Other specified hypothyroidism: Secondary | ICD-10-CM | POA: Diagnosis not present

## 2017-05-03 DIAGNOSIS — Z0000 Encounter for general adult medical examination without abnormal findings: Secondary | ICD-10-CM | POA: Diagnosis not present

## 2017-05-03 DIAGNOSIS — Z125 Encounter for screening for malignant neoplasm of prostate: Secondary | ICD-10-CM | POA: Diagnosis not present

## 2017-05-10 DIAGNOSIS — Z1389 Encounter for screening for other disorder: Secondary | ICD-10-CM | POA: Diagnosis not present

## 2017-05-10 DIAGNOSIS — Z0000 Encounter for general adult medical examination without abnormal findings: Secondary | ICD-10-CM | POA: Diagnosis not present

## 2017-05-10 DIAGNOSIS — E785 Hyperlipidemia, unspecified: Secondary | ICD-10-CM | POA: Diagnosis not present

## 2017-05-10 DIAGNOSIS — Z6841 Body Mass Index (BMI) 40.0 and over, adult: Secondary | ICD-10-CM | POA: Diagnosis not present

## 2017-05-10 DIAGNOSIS — D692 Other nonthrombocytopenic purpura: Secondary | ICD-10-CM | POA: Diagnosis not present

## 2017-05-10 DIAGNOSIS — E038 Other specified hypothyroidism: Secondary | ICD-10-CM | POA: Diagnosis not present

## 2017-05-10 DIAGNOSIS — I10 Essential (primary) hypertension: Secondary | ICD-10-CM | POA: Diagnosis not present

## 2017-05-10 DIAGNOSIS — I6990 Unspecified sequelae of unspecified cerebrovascular disease: Secondary | ICD-10-CM | POA: Diagnosis not present

## 2017-05-10 DIAGNOSIS — I872 Venous insufficiency (chronic) (peripheral): Secondary | ICD-10-CM | POA: Diagnosis not present

## 2017-05-10 DIAGNOSIS — E7849 Other hyperlipidemia: Secondary | ICD-10-CM | POA: Diagnosis not present

## 2017-05-10 DIAGNOSIS — I6529 Occlusion and stenosis of unspecified carotid artery: Secondary | ICD-10-CM | POA: Diagnosis not present

## 2017-05-14 DIAGNOSIS — Z1212 Encounter for screening for malignant neoplasm of rectum: Secondary | ICD-10-CM | POA: Diagnosis not present

## 2017-06-09 IMAGING — RF DG HIP (WITH OR WITHOUT PELVIS) 1V PORT*R*
1 series · 3 of 3 positions shown · non-contrast
Comparison: None.

CLINICAL DATA: Status post right total hip replacement.

EXAM:
DG C-ARM 1-60 MIN - NRPT [facility]; DG HIP  1V PORT RIGHT

[Series 1: run · 3 of 3 slices shown]
[im 1/3]
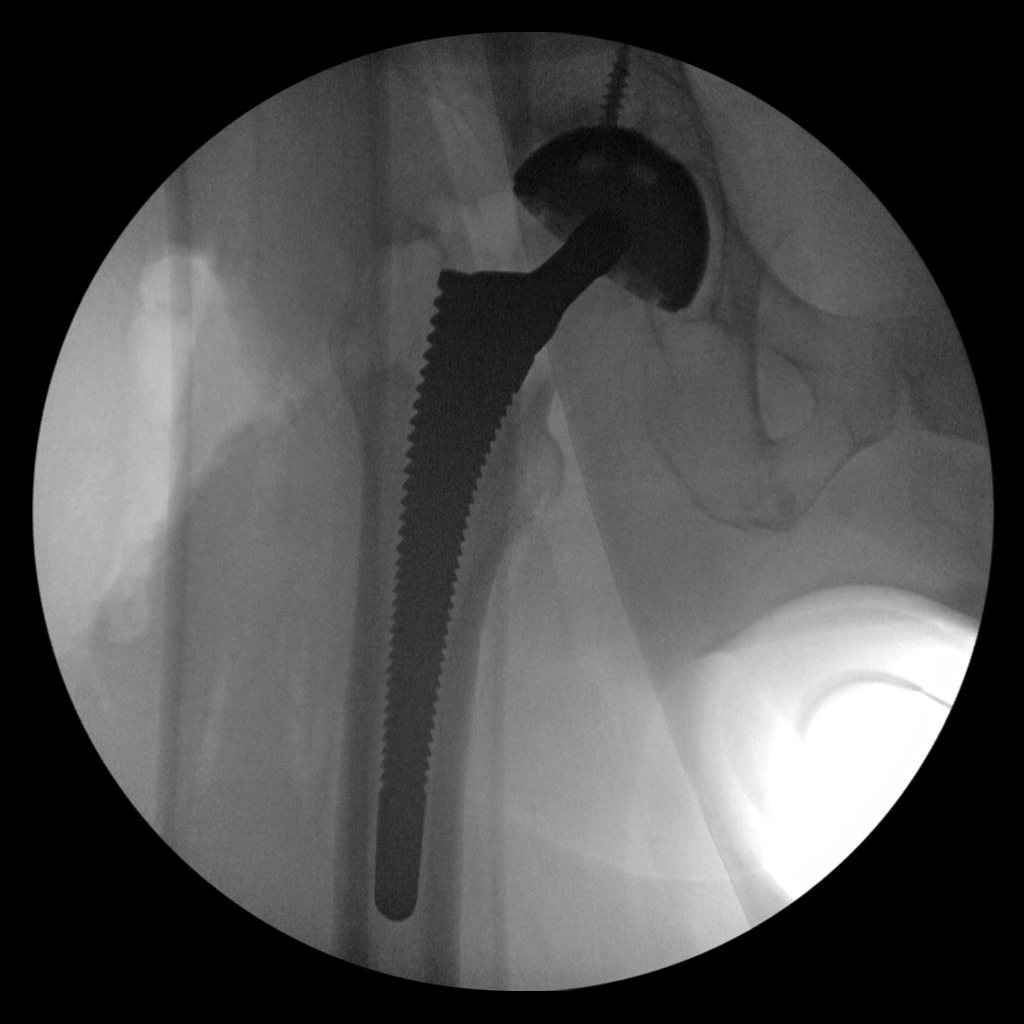
[im 2/3]
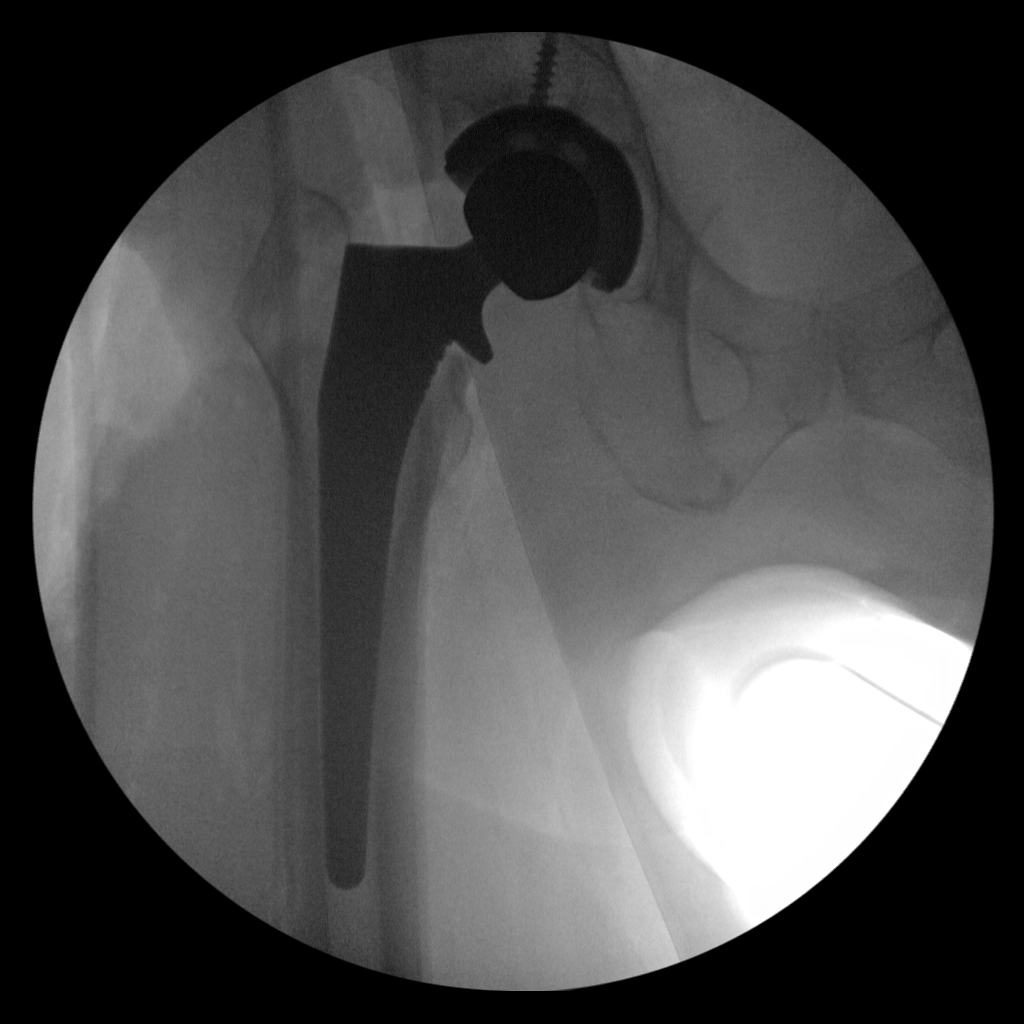
[im 3/3]
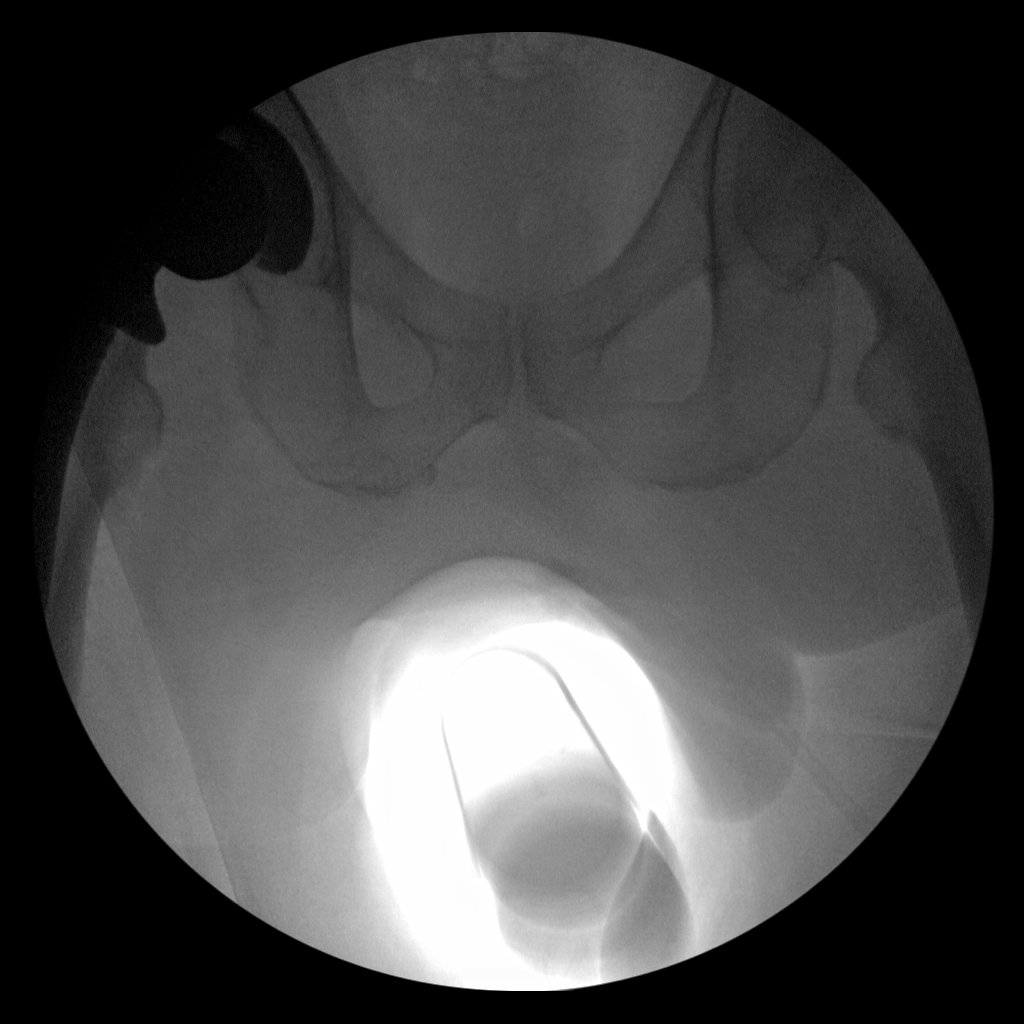

[3 of 3 positions shown; findings below may reference images not displayed]

FLUOROSCOPY TIME:  Fluoroscopy Time:  0 minutes 43 seconds

Number of Acquired Images:  3
FINDINGS: There is a total hip replacement on the right with prosthetic
components appearing well-seated. No acute fracture or dislocation.
No bony destruction appreciable
IMPRESSION: Prosthetic components appear well seated. No acute fracture or
dislocation.

## 2017-11-06 DIAGNOSIS — Z6841 Body Mass Index (BMI) 40.0 and over, adult: Secondary | ICD-10-CM | POA: Diagnosis not present

## 2017-11-06 DIAGNOSIS — I6990 Unspecified sequelae of unspecified cerebrovascular disease: Secondary | ICD-10-CM | POA: Diagnosis not present

## 2017-11-06 DIAGNOSIS — G4733 Obstructive sleep apnea (adult) (pediatric): Secondary | ICD-10-CM | POA: Diagnosis not present

## 2017-11-06 DIAGNOSIS — I872 Venous insufficiency (chronic) (peripheral): Secondary | ICD-10-CM | POA: Diagnosis not present

## 2017-11-06 DIAGNOSIS — D692 Other nonthrombocytopenic purpura: Secondary | ICD-10-CM | POA: Diagnosis not present

## 2017-11-06 DIAGNOSIS — E038 Other specified hypothyroidism: Secondary | ICD-10-CM | POA: Diagnosis not present

## 2017-11-06 DIAGNOSIS — Z1389 Encounter for screening for other disorder: Secondary | ICD-10-CM | POA: Diagnosis not present

## 2017-11-06 DIAGNOSIS — Z23 Encounter for immunization: Secondary | ICD-10-CM | POA: Diagnosis not present

## 2017-11-06 DIAGNOSIS — I10 Essential (primary) hypertension: Secondary | ICD-10-CM | POA: Diagnosis not present

## 2018-04-30 DIAGNOSIS — H2513 Age-related nuclear cataract, bilateral: Secondary | ICD-10-CM | POA: Diagnosis not present

## 2018-05-13 DIAGNOSIS — Z125 Encounter for screening for malignant neoplasm of prostate: Secondary | ICD-10-CM | POA: Diagnosis not present

## 2018-05-13 DIAGNOSIS — E7849 Other hyperlipidemia: Secondary | ICD-10-CM | POA: Diagnosis not present

## 2018-05-13 DIAGNOSIS — I10 Essential (primary) hypertension: Secondary | ICD-10-CM | POA: Diagnosis not present

## 2018-05-13 DIAGNOSIS — E038 Other specified hypothyroidism: Secondary | ICD-10-CM | POA: Diagnosis not present

## 2018-05-13 DIAGNOSIS — R82998 Other abnormal findings in urine: Secondary | ICD-10-CM | POA: Diagnosis not present

## 2018-05-20 DIAGNOSIS — I6990 Unspecified sequelae of unspecified cerebrovascular disease: Secondary | ICD-10-CM | POA: Diagnosis not present

## 2018-05-20 DIAGNOSIS — E785 Hyperlipidemia, unspecified: Secondary | ICD-10-CM | POA: Diagnosis not present

## 2018-05-20 DIAGNOSIS — D692 Other nonthrombocytopenic purpura: Secondary | ICD-10-CM | POA: Diagnosis not present

## 2018-05-20 DIAGNOSIS — Z418 Encounter for other procedures for purposes other than remedying health state: Secondary | ICD-10-CM | POA: Diagnosis not present

## 2018-05-20 DIAGNOSIS — Z0000 Encounter for general adult medical examination without abnormal findings: Secondary | ICD-10-CM | POA: Diagnosis not present

## 2018-05-20 DIAGNOSIS — I6529 Occlusion and stenosis of unspecified carotid artery: Secondary | ICD-10-CM | POA: Diagnosis not present

## 2018-05-20 DIAGNOSIS — M1990 Unspecified osteoarthritis, unspecified site: Secondary | ICD-10-CM | POA: Diagnosis not present

## 2018-05-20 DIAGNOSIS — I10 Essential (primary) hypertension: Secondary | ICD-10-CM | POA: Diagnosis not present

## 2018-05-20 DIAGNOSIS — E039 Hypothyroidism, unspecified: Secondary | ICD-10-CM | POA: Diagnosis not present

## 2018-05-20 DIAGNOSIS — L309 Dermatitis, unspecified: Secondary | ICD-10-CM | POA: Diagnosis not present

## 2018-05-20 DIAGNOSIS — Z1331 Encounter for screening for depression: Secondary | ICD-10-CM | POA: Diagnosis not present

## 2018-07-05 DIAGNOSIS — I10 Essential (primary) hypertension: Secondary | ICD-10-CM | POA: Diagnosis not present

## 2018-07-05 DIAGNOSIS — H60502 Unspecified acute noninfective otitis externa, left ear: Secondary | ICD-10-CM | POA: Diagnosis not present

## 2018-07-05 DIAGNOSIS — H9212 Otorrhea, left ear: Secondary | ICD-10-CM | POA: Diagnosis not present

## 2018-08-12 ENCOUNTER — Other Ambulatory Visit: Payer: Self-pay | Admitting: Internal Medicine

## 2018-08-12 DIAGNOSIS — I6523 Occlusion and stenosis of bilateral carotid arteries: Secondary | ICD-10-CM

## 2018-08-15 ENCOUNTER — Ambulatory Visit
Admission: RE | Admit: 2018-08-15 | Discharge: 2018-08-15 | Disposition: A | Discharge status: Home / Self Care | Payer: Medicare Other | Source: Ambulatory Visit | Attending: Internal Medicine | Admitting: Internal Medicine

## 2018-08-15 DIAGNOSIS — I6523 Occlusion and stenosis of bilateral carotid arteries: Secondary | ICD-10-CM

## 2018-08-20 DIAGNOSIS — Z23 Encounter for immunization: Secondary | ICD-10-CM | POA: Diagnosis not present

## 2018-09-23 DIAGNOSIS — I872 Venous insufficiency (chronic) (peripheral): Secondary | ICD-10-CM | POA: Diagnosis not present

## 2018-09-23 DIAGNOSIS — L302 Cutaneous autosensitization: Secondary | ICD-10-CM | POA: Diagnosis not present

## 2018-11-22 DIAGNOSIS — M1990 Unspecified osteoarthritis, unspecified site: Secondary | ICD-10-CM | POA: Diagnosis not present

## 2018-11-22 DIAGNOSIS — I6990 Unspecified sequelae of unspecified cerebrovascular disease: Secondary | ICD-10-CM | POA: Diagnosis not present

## 2018-11-22 DIAGNOSIS — I6529 Occlusion and stenosis of unspecified carotid artery: Secondary | ICD-10-CM | POA: Diagnosis not present

## 2018-11-22 DIAGNOSIS — I10 Essential (primary) hypertension: Secondary | ICD-10-CM | POA: Diagnosis not present

## 2018-11-22 DIAGNOSIS — Z418 Encounter for other procedures for purposes other than remedying health state: Secondary | ICD-10-CM | POA: Diagnosis not present

## 2018-11-22 DIAGNOSIS — E039 Hypothyroidism, unspecified: Secondary | ICD-10-CM | POA: Diagnosis not present

## 2018-11-22 DIAGNOSIS — G4733 Obstructive sleep apnea (adult) (pediatric): Secondary | ICD-10-CM | POA: Diagnosis not present

## 2018-11-22 DIAGNOSIS — H9212 Otorrhea, left ear: Secondary | ICD-10-CM | POA: Diagnosis not present

## 2019-02-26 ENCOUNTER — Ambulatory Visit: Payer: Medicare Other

## 2019-03-02 DIAGNOSIS — Z23 Encounter for immunization: Secondary | ICD-10-CM | POA: Diagnosis not present

## 2019-03-07 ENCOUNTER — Ambulatory Visit: Payer: Medicare Other

## 2019-03-09 ENCOUNTER — Ambulatory Visit: Payer: Medicare Other

## 2019-03-30 DIAGNOSIS — Z23 Encounter for immunization: Secondary | ICD-10-CM | POA: Diagnosis not present

## 2019-05-01 DIAGNOSIS — H2513 Age-related nuclear cataract, bilateral: Secondary | ICD-10-CM | POA: Diagnosis not present

## 2019-05-15 DIAGNOSIS — E038 Other specified hypothyroidism: Secondary | ICD-10-CM | POA: Diagnosis not present

## 2019-05-15 DIAGNOSIS — E7849 Other hyperlipidemia: Secondary | ICD-10-CM | POA: Diagnosis not present

## 2019-05-15 DIAGNOSIS — Z125 Encounter for screening for malignant neoplasm of prostate: Secondary | ICD-10-CM | POA: Diagnosis not present

## 2019-05-22 DIAGNOSIS — I10 Essential (primary) hypertension: Secondary | ICD-10-CM | POA: Diagnosis not present

## 2019-05-22 DIAGNOSIS — Z0000 Encounter for general adult medical examination without abnormal findings: Secondary | ICD-10-CM | POA: Diagnosis not present

## 2019-05-22 DIAGNOSIS — I6990 Unspecified sequelae of unspecified cerebrovascular disease: Secondary | ICD-10-CM | POA: Diagnosis not present

## 2019-05-22 DIAGNOSIS — R82998 Other abnormal findings in urine: Secondary | ICD-10-CM | POA: Diagnosis not present

## 2019-05-22 DIAGNOSIS — E785 Hyperlipidemia, unspecified: Secondary | ICD-10-CM | POA: Diagnosis not present

## 2019-05-22 DIAGNOSIS — I6529 Occlusion and stenosis of unspecified carotid artery: Secondary | ICD-10-CM | POA: Diagnosis not present

## 2019-05-22 DIAGNOSIS — M1990 Unspecified osteoarthritis, unspecified site: Secondary | ICD-10-CM | POA: Diagnosis not present

## 2019-05-22 DIAGNOSIS — Z418 Encounter for other procedures for purposes other than remedying health state: Secondary | ICD-10-CM | POA: Diagnosis not present

## 2019-05-22 DIAGNOSIS — G4733 Obstructive sleep apnea (adult) (pediatric): Secondary | ICD-10-CM | POA: Diagnosis not present

## 2019-05-22 DIAGNOSIS — Z1212 Encounter for screening for malignant neoplasm of rectum: Secondary | ICD-10-CM | POA: Diagnosis not present

## 2019-05-22 DIAGNOSIS — G43909 Migraine, unspecified, not intractable, without status migrainosus: Secondary | ICD-10-CM | POA: Diagnosis not present

## 2019-05-22 DIAGNOSIS — L309 Dermatitis, unspecified: Secondary | ICD-10-CM | POA: Diagnosis not present

## 2019-05-22 DIAGNOSIS — E039 Hypothyroidism, unspecified: Secondary | ICD-10-CM | POA: Diagnosis not present

## 2019-09-01 ENCOUNTER — Encounter: Payer: Self-pay | Admitting: Orthopaedic Surgery

## 2019-09-01 ENCOUNTER — Ambulatory Visit (INDEPENDENT_AMBULATORY_CARE_PROVIDER_SITE_OTHER): Payer: Medicare Other | Admitting: Orthopaedic Surgery

## 2019-09-01 ENCOUNTER — Ambulatory Visit: Payer: Self-pay

## 2019-09-01 ENCOUNTER — Ambulatory Visit (INDEPENDENT_AMBULATORY_CARE_PROVIDER_SITE_OTHER): Payer: Medicare Other

## 2019-09-01 VITALS — Ht 69.0 in | Wt 300.0 lb

## 2019-09-01 DIAGNOSIS — M25562 Pain in left knee: Secondary | ICD-10-CM

## 2019-09-01 DIAGNOSIS — G8929 Other chronic pain: Secondary | ICD-10-CM

## 2019-09-01 DIAGNOSIS — M1712 Unilateral primary osteoarthritis, left knee: Secondary | ICD-10-CM | POA: Diagnosis not present

## 2019-09-01 DIAGNOSIS — M1711 Unilateral primary osteoarthritis, right knee: Secondary | ICD-10-CM

## 2019-09-01 DIAGNOSIS — M25561 Pain in right knee: Secondary | ICD-10-CM

## 2019-09-01 MED ORDER — LIDOCAINE HCL 1 % IJ SOLN
3.0000 mL | INTRAMUSCULAR | Status: AC | PRN
  Administered 2019-09-01: 3 mL

## 2019-09-01 MED ORDER — METHYLPREDNISOLONE ACETATE 40 MG/ML IJ SUSP
40.0000 mg | INTRAMUSCULAR | Status: AC | PRN
  Administered 2019-09-01: 40 mg via INTRA_ARTICULAR

## 2019-09-01 NOTE — Progress Notes (Signed)
Office Visit Note   Patient: Casey Parker           Date of Birth: 08-10-1945           MRN: [id number] Visit Date: 09/01/2019              Requested by: Shon Baton, [place],  [place] [address] PCP: Shon Baton, MD   Assessment & Plan: Visit Diagnoses:  1. Chronic pain of both knees   2. Unilateral primary osteoarthritis, left knee   3. Unilateral primary osteoarthritis, right knee   4. Severe obesity (BMI >= 40) (HCC)     Plan: Given the combination of his morbid obesity along with the large soft tissue envelope around both knees combined with the pitting edema and venous stasis changes, I am not comfortable at all with performing knee replacement surgery on him.  I did offer him a steroid injection of both knees and do feel he is a candidate for hyaluronic acid.  Even if he lost weight I do not feel comfortable proceeding with knee replacement surgery just due to the mechanical difficulties of even getting an knee and him I am concerned about him having a higher infection risk.  I explained this to his son as well who is in agreement.  We went over his x-rays and described in detail her treatment plan.  He did tolerate steroids in both his knees today we will call him when we have approval for hyaluronic acid to try for both knees.The patient meets the AMA guidelines for Morbid (severe) obesity with a BMI > 40.0 and I have recommended weight loss.  Follow-Up Instructions: No follow-ups on file.   Orders:  Orders Placed This Encounter  Procedures  . Large Joint Inj  . XR Knee 1-2 Views Right  . XR Knee 1-2 Views Left   No orders of the defined types were placed in this encounter.     Procedures: Large Joint Inj: L knee on 09/01/2019 2:55 PM Indications: diagnostic evaluation and pain Details: 22 G 1.5 in needle, superolateral approach  Arthrogram: No  Medications: 3 mL lidocaine 1 %; 40 mg methylPREDNISolone acetate 40 MG/ML Outcome: tolerated well, no  immediate complications Procedure, treatment alternatives, risks and benefits explained, specific risks discussed. Consent was given by the patient. Immediately prior to procedure a time out was called to verify the correct patient, procedure, equipment, support staff and site/side marked as required. Patient was prepped and draped in the usual sterile fashion.       Clinical Data: No additional findings.   Subjective: Chief Complaint  Patient presents with  . Right Knee - Pain  . Left Knee - Pain  The patient comes in today with bilateral knee pain that is quite severe.  He is a very pleasant 74 year old gentleman that we have actually replaced his hip before.  I did explain to him the hip certainly different than the knees.  He ambulates with a cane.  His knees hurting both throughout the day with popping and grinding and swelling.  He would like to discuss knee replacement surgery.  His BMI is 44.  He is not a diabetic.  He has had no other acute change in medical status.  He does have a son on the phone for me to speak with today as well.  HPI  Review of Systems He currently denies any headache, chest pain, shortness of breath, fever, chills, nausea, vomiting  Objective: Vital Signs: Ht 5'  9" (1.753 m)   Wt 300 lb (136.1 kg)   BMI 44.30 kg/m   Physical Exam He is alert and orient x3 and in no acute distress Ortho Exam Examination of both knees shows very large soft tissue envelope around both knees.  He has pitting edema in both legs.  He has significant brawny venous stasis changes in both legs.  He has varus malalignment with medial joint line tenderness of both legs with patellofemoral crepitation. Specialty Comments:  No specialty comments available.  Imaging: XR Knee 1-2 Views Left  Result Date: 09/01/2019 2 views of the left knee show severe end-stage arthritis.  There is large soft tissue envelope around the knee.  There is varus malalignment and complete loss of  medial joint space.  There is paratracheal osteophytes in all 3 compartments.  XR Knee 1-2 Views Right  Result Date: 09/01/2019 2 views of the right knee show severe end-stage arthritis of the right knee with also a large soft tissue envelope.  There is varus malalignment and osteophytes in all 3 compartments with complete loss of medial joint space.    PMFS History: Patient Active Problem List   Diagnosis Date Noted  . Unilateral primary osteoarthritis, left knee 09/01/2019  . Unilateral primary osteoarthritis, right knee 09/01/2019  . Osteoarthritis of right hip 08/21/2014  . Status post total replacement of right hip 08/21/2014  . Obesity   . Osteoarthritis   . Migraines   . Cellulitis   . Transient ischemic attack (TIA)   . Diastolic dysfunction   . Left ventricular hypertrophy    Past Medical History:  Diagnosis Date  . Cellulitis    recurrent  . Diastolic dysfunction   . DVT (deep venous thrombosis) ([place])    20 yrs ago  . Enlarged prostate   . Hyperlipidemia   . Hypertension   . Left ventricular hypertrophy   . Migraines    H/O  . Obesity   . Osteoarthritis   . Stroke ([place])    11-2008 no residual problems  . Thyroid disease    hypo  . Transient ischemic attack (TIA)    presumed    Family History  Problem Relation Age of Onset  . Hypertension Father     Past Surgical History:  Procedure Laterality Date  . HAND SURGERY     right  . HERNIA REPAIR    . TONSILLECTOMY    . TOTAL HIP ARTHROPLASTY Right 08/21/2014   Procedure: RIGHT TOTAL HIP ARTHROPLASTY ANTERIOR APPROACH;  Surgeon: Mcarthur Rossetti, MD;  Location: [facility];  Service: Orthopedics;  Laterality: Right;   Social History   Occupational History  . Not on file  Tobacco Use  . Smoking status: Former Smoker    Quit date: 01/31/1988    Years since quitting: 31.6  Substance and Sexual Activity  . Alcohol use: Yes    Comment: on social occassions  . Drug use: No  . Sexual activity: Not on  file

## 2019-09-02 ENCOUNTER — Telehealth: Payer: Self-pay

## 2019-09-02 NOTE — Telephone Encounter (Signed)
Bilateral gel injections  

## 2019-09-04 NOTE — Telephone Encounter (Signed)
Can you please help with this ?

## 2019-09-04 NOTE — Telephone Encounter (Signed)
Submitted for VOB for Synvisc One- Bilateral knee

## 2019-09-05 ENCOUNTER — Telehealth: Payer: Self-pay

## 2019-09-05 NOTE — Telephone Encounter (Signed)
Pt called and scheduled

## 2019-09-05 NOTE — Telephone Encounter (Signed)
Approved for Synvisc One-Bilateral knee Dr. Margarito Liner and Bill Covered @ 100% -2nd insurance to pick up cost after medicare No Copay No prior auth required    Ok to schedule @ next available

## 2019-09-17 ENCOUNTER — Encounter: Payer: Self-pay | Admitting: Physician Assistant

## 2019-09-17 ENCOUNTER — Ambulatory Visit (INDEPENDENT_AMBULATORY_CARE_PROVIDER_SITE_OTHER): Payer: Medicare Other | Admitting: Physician Assistant

## 2019-09-17 DIAGNOSIS — M1711 Unilateral primary osteoarthritis, right knee: Secondary | ICD-10-CM | POA: Diagnosis not present

## 2019-09-17 DIAGNOSIS — M1712 Unilateral primary osteoarthritis, left knee: Secondary | ICD-10-CM

## 2019-09-17 NOTE — Progress Notes (Signed)
   Procedure Note  Patient: Casey Parker             Date of Birth: 1945-08-17           MRN: [id number]             Visit Date: 09/17/2019 HPI: [name] comes in today for bilateral knee injections with Synvisc 1.  He has known severe arthritis of both knees.  Patient ambulates with a cane.  He has had no new injury to either knee.  Physical exam: Bilateral knees no abnormal warmth erythema or effusion.  Full extension of both knees in flexion to 90 degrees.  Procedures: Visit Diagnoses:  1. Unilateral primary osteoarthritis, left knee   2. Unilateral primary osteoarthritis, right knee     Large Joint Inj: bilateral knee on 09/17/2019 4:37 PM Indications: pain Details: 22 G 1.5 in needle, anterolateral approach  Arthrogram: No  Medications (Right): 0.5 mL lidocaine 1 %; 48 mg Hylan 48 MG/6ML Medications (Left): 0.5 mL lidocaine 1 %; 48 mg Hylan 48 MG/6ML Outcome: tolerated well, no immediate complications Procedure, treatment alternatives, risks and benefits explained, specific risks discussed. Consent was given by the patient. Immediately prior to procedure a time out was called to verify the correct patient, procedure, equipment, support staff and site/side marked as required. Patient was prepped and draped in the usual sterile fashion.    Plan: We will see him back in 8 weeks to see what type of response he had to the [facility] 1 injections.  He will follow-up with [place] sooner if there is any questions or concerns.  Did place Ace bandages from both knees postinjection he will remove these for later evening.

## 2019-09-18 MED ORDER — HYLAN G-F 20 48 MG/6ML IX SOSY
48.0000 mg | PREFILLED_SYRINGE | INTRA_ARTICULAR | Status: AC | PRN
  Administered 2019-09-17: 48 mg via INTRA_ARTICULAR

## 2019-09-18 MED ORDER — LIDOCAINE HCL 1 % IJ SOLN
0.5000 mL | INTRAMUSCULAR | Status: AC | PRN
  Administered 2019-09-17: .5 mL

## 2019-11-12 ENCOUNTER — Ambulatory Visit (INDEPENDENT_AMBULATORY_CARE_PROVIDER_SITE_OTHER): Payer: Medicare Other | Admitting: Physician Assistant

## 2019-11-12 ENCOUNTER — Encounter: Payer: Self-pay | Admitting: Physician Assistant

## 2019-11-12 DIAGNOSIS — M1711 Unilateral primary osteoarthritis, right knee: Secondary | ICD-10-CM | POA: Diagnosis not present

## 2019-11-12 DIAGNOSIS — M1712 Unilateral primary osteoarthritis, left knee: Secondary | ICD-10-CM

## 2019-11-12 NOTE — Progress Notes (Signed)
HPI: Casey Parker returns today for follow-up status post bilateral Synvisc injections bilateral knees.  He states he had only 3 to 4 days of relief.  States he has pain in both knees with the prolonged standing or sitting.  He states pain is becoming worse.  Impression: End-stage arthritis bilateral knees  Plan discussed with patient the fact that he has significant venous stasis changes lower legs and large soft tissue envelope of both knees and given his BMI being over 40 with a BMI of at least 43 that would not recommend any surgical intervention of either knee.  Questions were encouraged and answered by Dr. Ninfa Linden and myself.  At this point time we have no further recommendations in regards to his knees did discuss with him possible ablation of the geniculate nerve but he was not interested in this.  Therefore he will follow up with [place] on as-needed basis.

## 2019-11-18 DIAGNOSIS — M1990 Unspecified osteoarthritis, unspecified site: Secondary | ICD-10-CM | POA: Diagnosis not present

## 2019-11-18 DIAGNOSIS — Z23 Encounter for immunization: Secondary | ICD-10-CM | POA: Diagnosis not present

## 2019-11-18 DIAGNOSIS — E039 Hypothyroidism, unspecified: Secondary | ICD-10-CM | POA: Diagnosis not present

## 2019-11-18 DIAGNOSIS — R600 Localized edema: Secondary | ICD-10-CM | POA: Diagnosis not present

## 2019-11-18 DIAGNOSIS — I10 Essential (primary) hypertension: Secondary | ICD-10-CM | POA: Diagnosis not present

## 2019-11-18 DIAGNOSIS — E785 Hyperlipidemia, unspecified: Secondary | ICD-10-CM | POA: Diagnosis not present

## 2019-11-18 DIAGNOSIS — G4733 Obstructive sleep apnea (adult) (pediatric): Secondary | ICD-10-CM | POA: Diagnosis not present

## 2019-11-18 DIAGNOSIS — I6529 Occlusion and stenosis of unspecified carotid artery: Secondary | ICD-10-CM | POA: Diagnosis not present

## 2019-11-18 DIAGNOSIS — I6990 Unspecified sequelae of unspecified cerebrovascular disease: Secondary | ICD-10-CM | POA: Diagnosis not present

## 2019-11-18 DIAGNOSIS — Z792 Long term (current) use of antibiotics: Secondary | ICD-10-CM | POA: Diagnosis not present

## 2019-11-18 DIAGNOSIS — I83893 Varicose veins of bilateral lower extremities with other complications: Secondary | ICD-10-CM | POA: Diagnosis not present

## 2019-11-18 DIAGNOSIS — I872 Venous insufficiency (chronic) (peripheral): Secondary | ICD-10-CM | POA: Diagnosis not present

## 2019-12-04 DIAGNOSIS — Z23 Encounter for immunization: Secondary | ICD-10-CM | POA: Diagnosis not present

## 2020-01-01 ENCOUNTER — Ambulatory Visit (INDEPENDENT_AMBULATORY_CARE_PROVIDER_SITE_OTHER): Payer: Medicare Other | Admitting: Orthopaedic Surgery

## 2020-01-01 ENCOUNTER — Ambulatory Visit (INDEPENDENT_AMBULATORY_CARE_PROVIDER_SITE_OTHER): Payer: Medicare Other

## 2020-01-01 ENCOUNTER — Other Ambulatory Visit: Payer: Self-pay

## 2020-01-01 ENCOUNTER — Encounter: Payer: Self-pay | Admitting: Orthopaedic Surgery

## 2020-01-01 DIAGNOSIS — M25522 Pain in left elbow: Secondary | ICD-10-CM | POA: Diagnosis not present

## 2020-01-01 NOTE — Progress Notes (Signed)
Office Visit Note   Patient: Casey Parker           Date of Birth: 01/02/1946           MRN: [id number] Visit Date: 01/01/2020              Requested by: Shon Baton, [address] [place],  [place] [address] PCP: Shon Baton, MD   Assessment & Plan: Visit Diagnoses:  1. Pain in left elbow     Plan: Casey Parker has been a patient of Dr. Trevor Mace for right hip replacement.  He also has significant arthritis in both of his knees.  He is morbidly obese and has lost 200 pounds but still has very large knees and is at significant risk for knee replacement surgery.  He had inquired about bracing and I will send them to [facility].  In addition he has been experiencing some pain along the medial aspect of his left elbow.  Does use a cane and notes that is not had any injury or trauma but does have some discomfort in that area is not having any nerve pain or numbness or tingling into the ulnar 2 digits.  X-rays reveal olecranon spurring and I suspect that is the cause of his pain.  He does not have any symptoms relative to the ulnar nerve.  We discussed cortisone injection, CBD oil or even Voltaren gel and he will try one of the latter 2 and let me know.  He did not want a cortisone injection.  I did not see any other abnormalities  Follow-Up Instructions: Return if symptoms worsen or fail to improve.   Orders:  Orders Placed This Encounter  Procedures  . XR Elbow 2 Views Left   No orders of the defined types were placed in this encounter.     Procedures: No procedures performed   Clinical Data: No additional findings.   Subjective: Chief Complaint  Patient presents with  . Left Elbow - Pain  Patient presents today for left elbow pain. He said that his elbow has hurt for two years. In the last two months his pain has worsened. The pain is located medially, and seems to occur more with use of his arm. He has started to rely more on his cane due to his knees and states that  causes his elbow to hurt more. He does not take anything for pain. He is left hand dominant.   HPI  Review of Systems   Objective: Vital Signs: There were no vitals taken for this visit.  Physical Exam Constitutional:      Appearance: He is well-developed.  Eyes:     Pupils: Pupils are equal, round, and reactive to light.  Pulmonary:     Effort: Pulmonary effort is normal.  Skin:    General: Skin is warm and dry.  Neurological:     Mental Status: He is alert and oriented to person, place, and time.  Psychiatric:        Behavior: Behavior normal.     Ortho Exam very slow and awkward gait related to his knee arthritis.  Very large legs and knees.  Uses a cane for ambulation.  Left elbow had some tenderness along the medial aspect of the olecranon.  No pain over the ulnar nerve.  Negative Tinel's.  No pain over either epicondyles.  Full range of motion in pronation supination flexion extension.  Good grip and good release.  No distal edema.  No evidence of an  olecranon bursa  Specialty Comments:  No specialty comments available.  Imaging: XR Elbow 2 Views Left  Result Date: 01/01/2020 Films of the left elbow are obtained in several projections.  I did not see any obvious arthritis at the elbow joint but there is in early prominent spur of the olecranon.  No acute changes patient is symptomatic in the area of the olecranon    PMFS History: Patient Active Problem List   Diagnosis Date Noted  . Pain in left elbow 01/01/2020  . Unilateral primary osteoarthritis, left knee 09/01/2019  . Unilateral primary osteoarthritis, right knee 09/01/2019  . Osteoarthritis of right hip 08/21/2014  . Status post total replacement of right hip 08/21/2014  . Obesity   . Osteoarthritis   . Migraines   . Cellulitis   . Transient ischemic attack (TIA)   . Diastolic dysfunction   . Left ventricular hypertrophy    Past Medical History:  Diagnosis Date  . Cellulitis    recurrent  .  Diastolic dysfunction   . DVT (deep venous thrombosis) ([place])    20 yrs ago  . Enlarged prostate   . Hyperlipidemia   . Hypertension   . Left ventricular hypertrophy   . Migraines    H/O  . Obesity   . Osteoarthritis   . Stroke ([place])    11-2008 no residual problems  . Thyroid disease    hypo  . Transient ischemic attack (TIA)    presumed    Family History  Problem Relation Age of Onset  . Hypertension Father     Past Surgical History:  Procedure Laterality Date  . HAND SURGERY     right  . HERNIA REPAIR    . TONSILLECTOMY    . TOTAL HIP ARTHROPLASTY Right 08/21/2014   Procedure: RIGHT TOTAL HIP ARTHROPLASTY ANTERIOR APPROACH;  Surgeon: Mcarthur Rossetti, MD;  Location: [facility];  Service: Orthopedics;  Laterality: Right;   Social History   Occupational History  . Not on file  Tobacco Use  . Smoking status: Former Smoker    Quit date: 01/31/1988    Years since quitting: 31.9  Substance and Sexual Activity  . Alcohol use: Yes    Comment: on social occassions  . Drug use: No  . Sexual activity: Not on file

## 2020-01-21 ENCOUNTER — Telehealth: Payer: Self-pay

## 2020-01-21 NOTE — Telephone Encounter (Signed)
Patient called he is requesting a copy of the xray of his left elbow and both of his knees he would like the disc to be emailed and he is requesting a form for a handicap placard for 2 cars. He wants the placard emailed as well. Email:[email or web address] [phone]

## 2020-02-09 ENCOUNTER — Telehealth: Payer: Self-pay

## 2020-02-09 NOTE — Telephone Encounter (Signed)
Patient called he is requesting office notes to be sent to [facility] about his knee  Fax: [place] ( I did explain medical records form has to be filled out in order to be sent pt stated he has requested notes before without having to.. not sure if that's true.)  RK:[phone]

## 2020-02-27 NOTE — Telephone Encounter (Signed)
Pt was advised need to sign release form. We'll be able to fax records when that is received.

## 2020-04-12 DIAGNOSIS — G8929 Other chronic pain: Secondary | ICD-10-CM | POA: Insufficient documentation

## 2020-04-15 DIAGNOSIS — M170 Bilateral primary osteoarthritis of knee: Secondary | ICD-10-CM | POA: Diagnosis not present

## 2020-04-21 ENCOUNTER — Other Ambulatory Visit (HOSPITAL_COMMUNITY): Payer: Self-pay | Admitting: Vascular Surgery

## 2020-04-21 DIAGNOSIS — M7989 Other specified soft tissue disorders: Secondary | ICD-10-CM

## 2020-04-22 ENCOUNTER — Encounter: Payer: Self-pay | Admitting: Vascular Surgery

## 2020-04-22 ENCOUNTER — Ambulatory Visit (INDEPENDENT_AMBULATORY_CARE_PROVIDER_SITE_OTHER): Payer: Medicare Other | Admitting: Vascular Surgery

## 2020-04-22 ENCOUNTER — Other Ambulatory Visit: Payer: Self-pay

## 2020-04-22 ENCOUNTER — Ambulatory Visit (HOSPITAL_COMMUNITY)
Admission: RE | Admit: 2020-04-22 | Discharge: 2020-04-22 | Disposition: A | Discharge status: Home / Self Care | Payer: Medicare Other | Source: Ambulatory Visit | Attending: Vascular Surgery | Admitting: Vascular Surgery

## 2020-04-22 VITALS — BP 133/67 | HR 72 | Temp 98.1°F | Resp 18 | Ht 69.0 in | Wt 306.0 lb

## 2020-04-22 DIAGNOSIS — M7989 Other specified soft tissue disorders: Secondary | ICD-10-CM | POA: Insufficient documentation

## 2020-04-22 DIAGNOSIS — I872 Venous insufficiency (chronic) (peripheral): Secondary | ICD-10-CM | POA: Diagnosis not present

## 2020-04-22 DIAGNOSIS — I890 Lymphedema, not elsewhere classified: Secondary | ICD-10-CM | POA: Diagnosis not present

## 2020-04-22 NOTE — Progress Notes (Signed)
REASON FOR CONSULT:    Evaluate chronic venous insufficiency.  The consult is requested by Dr. Shon Baton.  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  He has massive swelling in both lower extremities as documented in the photographs below.  I have explained to him that the treatment for both chronic venous insufficiency and lymphedema are essentially the same.  We have discussed the importance of intermittent leg elevation and the proper positioning for this.  I have recommended a leg rest which is available in extrawide weight which I think would fit him.  I encouraged him to elevate his legs as much as possible but at least 3-4 times a day for 20 minutes.  Unfortunately I do not think we can get any better on compression stockings without getting a custom stocking which would be very expensive.  He does have some stockings now that just do not go up high enough on his legs.  However because of his body habitus I think that is the best we can do.  I have encouraged him to try to avoid prolonged sitting and standing.  We discussed the importance of exercise although his activity is obviously very limited by his knee issues.  I have recommended water aerobics which would give him an opportunity to exercise but at the same time is helpful for patients with lymphedema and chronic venous insufficiency.  In addition we have discussed the importance of maintaining healthy weight as central obesity especially increases lower extremity venous pressure.  I plan on seeing him back in 8 weeks.  If he has not made significant improvement with these measures, I think he would be a candidate for a lymphatic compression device and I would recommend the BioTAB lymphedema pump.  With respect to him being considered for bilateral knee replacements, I have  several concerns.  He has had a previous DVT in the left leg and certainly that puts him at high risk for a postoperative DVT.  My main concern would be wound healing given his advanced chronic venous insufficiency (CEAP C4b ), lymphedema, and obesity.  I think he would be at high risk for wound healing issues and worsening leg swelling.  He does have reasonable Doppler signals in the dorsalis pedis position bilaterally but it is difficult to fully assess his arterial status given his massive leg swelling.  However based on my assessment I think he does have adequate arterial flow for surgery.  In short I explained that I do not think these are necessarily absolute contraindications but that certainly he would be very high risk for knee replacement surgery from a vascular standpoint for these reasons.   Deitra Mayo, MD Office: [phone]   HPI:   JOHNIE MAKKI is a pleasant 75 y.o. male, who is referred for evaluation of chronic venous insufficiency.  He is reportedly being considered for knee replacement surgery.  I have reviewed the records from the referring office.  He has varicose veins of both lower extremities with evidence of chronic venous insufficiency.   On my history the patient states that he has a history of previous left lower extremity DVT 25 years ago.  He has had chronic swelling of both lower extremities.  He tells me that he had interventions at [facility] many years ago.  I suspect this was laser ablation based  on what he describes.  He has been wearing compression stockings although because of the size of his legs these only go up his calf partly.  He also elevates his legs.  This does help with the swelling.  His activity is very limited because of his arthritis in the knees.  He walks with a walker with some difficulty.  He has had previous right hip replacement 6 years ago.  He has done well from that standpoint.  He does describe some aching and heaviness in his  legs which is aggravated by standing and sitting and relieved somewhat with elevation.  He denies abdominal surgery, or radiation therapy to his abdomen or groins.  Past Medical History:  Diagnosis Date  . Cellulitis    recurrent  . Diastolic dysfunction   . DVT (deep venous thrombosis) ([place])    20 yrs ago  . Enlarged prostate   . Hyperlipidemia   . Hypertension   . Left ventricular hypertrophy   . Migraines    H/O  . Obesity   . Osteoarthritis   . Stroke ([place])    11-2008 no residual problems  . Thyroid disease    hypo  . Transient ischemic attack (TIA)    presumed    Family History  Problem Relation Age of Onset  . Hypertension Father     SOCIAL HISTORY: Social History   Socioeconomic History  . Marital status: Married    Spouse name: Not on file  . Number of children: Not on file  . Years of education: Not on file  . Highest education level: Not on file  Occupational History  . Not on file  Tobacco Use  . Smoking status: Former Smoker    Quit date: 01/31/1988    Years since quitting: 32.2  . Smokeless tobacco: Never Used  Substance and Sexual Activity  . Alcohol use: Yes    Comment: on social occassions  . Drug use: No  . Sexual activity: Not on file  Other Topics Concern  . Not on file  Social History Narrative  . Not on file   Social Determinants of Health   Financial Resource Strain: Not on file  Food Insecurity: Not on file  Transportation Needs: Not on file  Physical Activity: Not on file  Stress: Not on file  Social Connections: Not on file  Intimate Partner Violence: Not on file    Allergies  Allergen Reactions  . Augmentin [Amoxicillin-Pot Clavulanate]   . Other     bovine products -itching  . Penicillins     Pt unsure if allergic to penicillin    Current Outpatient Medications  Medication Sig Dispense Refill  . aspirin 325 MG tablet Take 1 tablet (325 mg total) by mouth 2 (two) times daily after a meal. 30 tablet 0  .  levothyroxine (SYNTHROID, LEVOTHROID) 125 MCG tablet Take 125 mcg by mouth daily.    Marland Kitchen losartan (COZAAR) 100 MG tablet Take 100 mg by mouth daily.    . methocarbamol (ROBAXIN) 500 MG tablet Take 1 tablet (500 mg total) by mouth every 6 (six) hours as needed for muscle spasms. 60 tablet 0  . Multiple Vitamin (MULTIVITAMIN) tablet Take 1 tablet by mouth daily.    . Omega-3 Fatty Acids (FISH OIL PO) Take 1,600 Units by mouth daily.    Marland Kitchen oxyCODONE-acetaminophen (ROXICET) 5-325 MG per tablet Take 1-2 tablets by mouth every 4 (four) hours as needed. 60 tablet 0  . psyllium (METAMUCIL) 58.6 % powder Take 1  packet by mouth daily.    . simvastatin (ZOCOR) 20 MG tablet Take 20 mg by mouth daily.     . Sulfamethoxazole-Trimethoprim (SULFAMETHOXAZOLE-TMP DS PO) Take 1 tablet by mouth daily.     No current facility-administered medications for this visit.    REVIEW OF SYSTEMS:  [X]  denotes positive finding, [ ]  denotes negative finding Cardiac  Comments:  Chest pain or chest pressure:    Shortness of breath upon exertion:    Short of breath when lying flat:    Irregular heart rhythm:        Vascular    Pain in calf, thigh, or hip brought on by ambulation: x   Pain in feet at night that wakes you up from your sleep:     Blood clot in your veins:    Leg swelling:  x       Pulmonary    Oxygen at home:    Productive cough:     Wheezing:         Neurologic    Sudden weakness in arms or legs:     Sudden numbness in arms or legs:     Sudden onset of difficulty speaking or slurred speech:    Temporary loss of vision in one eye:     Problems with dizziness:         Gastrointestinal    Blood in stool:     Vomited blood:         Genitourinary    Burning when urinating:     Blood in urine:        Psychiatric    Major depression:         Hematologic    Bleeding problems:    Problems with blood clotting too easily:        Skin    Rashes or ulcers: x       Constitutional    Fever or  chills:     PHYSICAL EXAM:   Vitals:   04/22/20 1108  BP: 133/67  Pulse: 72  Resp: 18  Temp: 98.1 F (36.7 C)  TempSrc: Temporal  SpO2: 98%  Weight: (!) 138.8 kg  Height: 5\' 9"  (1.753 m)     Body mass index is 45.19 kg/m.  GENERAL: The patient is a well-nourished male, in no acute distress. The vital signs are documented above. CARDIAC: There is a regular rate and rhythm.  VASCULAR: I do not detect carotid bruits. He has normal femoral pulses. Because of the normal swelling around his knees I cannot palpate popliteal pulses. On the right side I cannot palpate pedal pulses however he has a brisk dorsalis pedis signal with a Doppler.  I cannot obtain a posterior tibial signal because of the low swelling in the foot and the skin lipodermatosclerosis.  On the left side, he has a palpable dorsalis pedis pulse.  I cannot obtain a posterior tibial signal with a Doppler again because of the swelling and the  Lipodermatosclerosis. He has massive swelling in both lower extremities mostly around the knees and lower legs. He has hyperpigmentation and significant lipodermatosclerosis.  He has significant hyperkeratosis and papillomatosis on the right leg.  Of note I did look at both saphenous veins myself with the SonoSite.  There is significant subcutaneous edema and the saphenous veins are very deep bilaterally.  Given the depth of the vein I do not think he is a good candidate for laser ablation of the saphenous vein on either side.  PULMONARY: There is good air exchange bilaterally without wheezing or rales. ABDOMEN: Soft and non-tender with normal pitched bowel sounds.  MUSCULOSKELETAL: There are no major deformities or cyanosis. NEUROLOGIC: No focal weakness or paresthesias are detected. SKIN: There are no ulcers or rashes noted. PSYCHIATRIC: The patient has a normal affect.  DATA:    VENOUS DUPLEX: I have independently interpreted his venous duplex scan  today.  On the right side there is no evidence of DVT.  There is deep venous reflux involving the common femoral vein.  There is significant superficial venous reflux in the right great saphenous vein from the saphenofemoral junction to the knee. There is also reflux in the small saphenous vein on the right.  On the left side there is no evidence of DVT.  There is deep venous reflux involving the common femoral vein.  There is superficial venous reflux in the left great saphenous vein although there is clot noted proximally.

## 2020-05-03 DIAGNOSIS — M25561 Pain in right knee: Secondary | ICD-10-CM | POA: Diagnosis not present

## 2020-05-03 DIAGNOSIS — M25562 Pain in left knee: Secondary | ICD-10-CM | POA: Diagnosis not present

## 2020-05-06 DIAGNOSIS — H2513 Age-related nuclear cataract, bilateral: Secondary | ICD-10-CM | POA: Diagnosis not present

## 2020-05-13 DIAGNOSIS — Z23 Encounter for immunization: Secondary | ICD-10-CM | POA: Diagnosis not present

## 2020-05-18 DIAGNOSIS — M25561 Pain in right knee: Secondary | ICD-10-CM | POA: Diagnosis not present

## 2020-05-18 DIAGNOSIS — M25562 Pain in left knee: Secondary | ICD-10-CM | POA: Diagnosis not present

## 2020-05-20 DIAGNOSIS — E039 Hypothyroidism, unspecified: Secondary | ICD-10-CM | POA: Diagnosis not present

## 2020-05-20 DIAGNOSIS — L0390 Cellulitis, unspecified: Secondary | ICD-10-CM | POA: Diagnosis not present

## 2020-05-20 DIAGNOSIS — Z125 Encounter for screening for malignant neoplasm of prostate: Secondary | ICD-10-CM | POA: Diagnosis not present

## 2020-05-20 DIAGNOSIS — E785 Hyperlipidemia, unspecified: Secondary | ICD-10-CM | POA: Diagnosis not present

## 2020-05-27 DIAGNOSIS — I6990 Unspecified sequelae of unspecified cerebrovascular disease: Secondary | ICD-10-CM | POA: Diagnosis not present

## 2020-05-27 DIAGNOSIS — I83893 Varicose veins of bilateral lower extremities with other complications: Secondary | ICD-10-CM | POA: Diagnosis not present

## 2020-05-27 DIAGNOSIS — Z792 Long term (current) use of antibiotics: Secondary | ICD-10-CM | POA: Diagnosis not present

## 2020-05-27 DIAGNOSIS — R600 Localized edema: Secondary | ICD-10-CM | POA: Diagnosis not present

## 2020-05-27 DIAGNOSIS — I872 Venous insufficiency (chronic) (peripheral): Secondary | ICD-10-CM | POA: Diagnosis not present

## 2020-05-27 DIAGNOSIS — L97421 Non-pressure chronic ulcer of left heel and midfoot limited to breakdown of skin: Secondary | ICD-10-CM | POA: Diagnosis not present

## 2020-05-27 DIAGNOSIS — I10 Essential (primary) hypertension: Secondary | ICD-10-CM | POA: Diagnosis not present

## 2020-05-27 DIAGNOSIS — I6529 Occlusion and stenosis of unspecified carotid artery: Secondary | ICD-10-CM | POA: Diagnosis not present

## 2020-05-27 DIAGNOSIS — Z0000 Encounter for general adult medical examination without abnormal findings: Secondary | ICD-10-CM | POA: Diagnosis not present

## 2020-05-27 DIAGNOSIS — E785 Hyperlipidemia, unspecified: Secondary | ICD-10-CM | POA: Diagnosis not present

## 2020-05-27 DIAGNOSIS — G4733 Obstructive sleep apnea (adult) (pediatric): Secondary | ICD-10-CM | POA: Diagnosis not present

## 2020-05-27 DIAGNOSIS — R82998 Other abnormal findings in urine: Secondary | ICD-10-CM | POA: Diagnosis not present

## 2020-05-27 DIAGNOSIS — E039 Hypothyroidism, unspecified: Secondary | ICD-10-CM | POA: Diagnosis not present

## 2020-05-31 DIAGNOSIS — M25562 Pain in left knee: Secondary | ICD-10-CM | POA: Diagnosis not present

## 2020-06-02 ENCOUNTER — Other Ambulatory Visit: Payer: Self-pay

## 2020-06-02 ENCOUNTER — Ambulatory Visit (INDEPENDENT_AMBULATORY_CARE_PROVIDER_SITE_OTHER): Payer: Medicare Other

## 2020-06-02 ENCOUNTER — Ambulatory Visit (INDEPENDENT_AMBULATORY_CARE_PROVIDER_SITE_OTHER): Payer: Medicare Other | Admitting: Podiatry

## 2020-06-02 DIAGNOSIS — R600 Localized edema: Secondary | ICD-10-CM | POA: Diagnosis not present

## 2020-06-02 DIAGNOSIS — L989 Disorder of the skin and subcutaneous tissue, unspecified: Secondary | ICD-10-CM | POA: Diagnosis not present

## 2020-06-02 DIAGNOSIS — B351 Tinea unguium: Secondary | ICD-10-CM | POA: Diagnosis not present

## 2020-06-02 DIAGNOSIS — M79674 Pain in right toe(s): Secondary | ICD-10-CM | POA: Diagnosis not present

## 2020-06-02 DIAGNOSIS — M79675 Pain in left toe(s): Secondary | ICD-10-CM

## 2020-06-02 DIAGNOSIS — L97509 Non-pressure chronic ulcer of other part of unspecified foot with unspecified severity: Secondary | ICD-10-CM | POA: Diagnosis not present

## 2020-06-21 NOTE — Progress Notes (Signed)
    Subjective: Patient is a 75 y.o. male presenting to the office today as a new patient for evaluation of painful callus lesion(s) noted to the bilateral feet that has been present for several years Patient also complains of elongated, thickened nails that cause pain while ambulating in shoes.  He is unable to trim his own nails. Patient presents today for further treatment and evaluation.  Past Medical History:  Diagnosis Date  . Cellulitis    recurrent  . Diastolic dysfunction   . DVT (deep venous thrombosis) ([place])    20 yrs ago  . Enlarged prostate   . Hyperlipidemia   . Hypertension   . Left ventricular hypertrophy   . Migraines    H/O  . Obesity   . Osteoarthritis   . Stroke ([place])    11-2008 no residual problems  . Thyroid disease    hypo  . Transient ischemic attack (TIA)    presumed    Objective:  Physical Exam General: Alert and oriented x3 in no acute distress  Dermatology: Hyperkeratotic lesion(s) present on the bilateral feet. Pain on palpation with a central nucleated core noted. Skin is warm, dry and supple bilateral lower extremities. Negative for open lesions or macerations. Nails are tender, long, thickened and dystrophic with subungual debris, consistent with onychomycosis, 1-5 bilateral. No signs of infection noted.  Vascular: Palpable pedal pulses bilaterally. No erythema noted. Capillary refill within normal limits.  There is a moderate edema noted to the bilateral lower extremities  Neurological: Epicritic and protective threshold diminished bilaterally.   Musculoskeletal Exam: Pain on palpation at the keratotic lesion(s) noted. Range of motion within normal limits bilateral. Muscle strength 5/5 in all groups bilateral.  Assessment: 1. Onychodystrophic nails 1-5 bilateral with hyperkeratosis of nails.  2. Onychomycosis of nail due to dermatophyte bilateral 3.  Preulcerative callus lesions to the bilateral feet 4.  Bilateral lower extremity  edema   Plan of Care:  1. Patient evaluated. 2. Excisional debridement of keratoic lesion(s) using a chisel blade was performed without incident.  Salicylic acid applied. 3. Dressed with light dressing. 4. Mechanical debridement of nails 1-5 bilaterally performed using a nail nipper. Filed with dremel without incident.  5.  To address the edema recommend compression socks daily  6.  Patient is to return to the clinic in 3 months.   Edrick Kins, DPM [facility]  Dr. Edrick Kins, DPM    [address], [place] [address]                Office [phone]  Fax [phone]

## 2020-06-22 DIAGNOSIS — M25562 Pain in left knee: Secondary | ICD-10-CM | POA: Diagnosis not present

## 2020-06-23 DIAGNOSIS — G5601 Carpal tunnel syndrome, right upper limb: Secondary | ICD-10-CM | POA: Diagnosis not present

## 2020-06-24 ENCOUNTER — Ambulatory Visit: Payer: Medicare Other | Admitting: Vascular Surgery

## 2020-06-29 DIAGNOSIS — M25561 Pain in right knee: Secondary | ICD-10-CM | POA: Diagnosis not present

## 2020-07-06 DIAGNOSIS — G5601 Carpal tunnel syndrome, right upper limb: Secondary | ICD-10-CM | POA: Diagnosis not present

## 2020-07-16 DIAGNOSIS — G5601 Carpal tunnel syndrome, right upper limb: Secondary | ICD-10-CM | POA: Diagnosis not present

## 2020-07-26 DIAGNOSIS — M25561 Pain in right knee: Secondary | ICD-10-CM | POA: Diagnosis not present

## 2020-07-26 DIAGNOSIS — M25562 Pain in left knee: Secondary | ICD-10-CM | POA: Diagnosis not present

## 2020-09-06 ENCOUNTER — Ambulatory Visit (INDEPENDENT_AMBULATORY_CARE_PROVIDER_SITE_OTHER): Payer: Medicare Other | Admitting: Podiatry

## 2020-09-06 ENCOUNTER — Other Ambulatory Visit: Payer: Self-pay

## 2020-09-06 DIAGNOSIS — B351 Tinea unguium: Secondary | ICD-10-CM

## 2020-09-06 DIAGNOSIS — L03119 Cellulitis of unspecified part of limb: Secondary | ICD-10-CM

## 2020-09-06 DIAGNOSIS — L02619 Cutaneous abscess of unspecified foot: Secondary | ICD-10-CM

## 2020-09-06 DIAGNOSIS — M79675 Pain in left toe(s): Secondary | ICD-10-CM

## 2020-09-06 DIAGNOSIS — M79674 Pain in right toe(s): Secondary | ICD-10-CM | POA: Diagnosis not present

## 2020-09-06 DIAGNOSIS — L988 Other specified disorders of the skin and subcutaneous tissue: Secondary | ICD-10-CM

## 2020-09-06 DIAGNOSIS — L989 Disorder of the skin and subcutaneous tissue, unspecified: Secondary | ICD-10-CM | POA: Diagnosis not present

## 2020-09-07 DIAGNOSIS — L988 Other specified disorders of the skin and subcutaneous tissue: Secondary | ICD-10-CM | POA: Insufficient documentation

## 2020-09-07 DIAGNOSIS — L03119 Cellulitis of unspecified part of limb: Secondary | ICD-10-CM | POA: Insufficient documentation

## 2020-09-07 DIAGNOSIS — L02619 Cutaneous abscess of unspecified foot: Secondary | ICD-10-CM | POA: Insufficient documentation

## 2020-09-07 NOTE — Progress Notes (Signed)
.    This patient presents to the office today for continued treatment of painful callus on the bottom of his left foot.  He says he also has long thick painful nails which are painful walking and wearing his shoes he was previously seen by Dr. Amalia Hailey in May 2022 and referred to my services.  He states that he is unable to wear socks on his right foot at this time.  He also has difficulty walking due to his right foot pain.  Upon examining his left foot,  his right foot is red and swollen with peeling skin noted extending up into his lower left leg.  He presents the office for preventative foot care services but I deemed necessary to evaluate his right foot at this visit.  Patient says he takes an antibiotic daily.    General Appearance  Alert, conversant and in no acute stress.  Vascular  Dorsalis pedis and posterior tibial  pulses are palpable  left foot.  Diminished pulses right foot due to swelling.    Capillary return is within normal limits  bilaterally. Temperature is within normal limits  bilaterally.  Neurologic  Senn-Weinstein monofilament wire test diminished  bilaterally. Muscle power within normal limits bilaterally.  Nails Thick disfigured discolored nails with subungual debris  from hallux to fifth toes bilaterally. No evidence of bacterial infection or drainage bilaterally.  Orthopedic  No limitations of motion  feet .  No crepitus or effusions noted.  No bony pathology or digital deformities noted.  Skin  Porokerastosis sub 5th metabase left foot.  Dry skin left foot.   Examination of his right foot reveals a red swollen draining right foot.  There is evidence of desquamation noted to his right foot.   Onychomycosis  B/L.  Porokeratosis left foot.  Cellulitis with masceration left foot.  ROV.  Debride nails with nail nipper followed by dremel usage.  Debride porokeratosis left foot with # 15 blade.  Dr. Amalia Hailey came for an evaluation of his right foot.  He saw an infectious process in  his foot and recommended he continue antibiotics.  He also recommended the right foot be bandaged with a Betadine bandage and to walk with a surgical shoe. Patient was instructed on bandage changes after his shower. He told the patient to return to the office in 10 days for his evaluation and treatment.  If this condition worsens or becomes very painful, the patient was told to contact this office or go to the Emergency Department at the hospital.  Gardiner Barefoot DPM

## 2020-09-10 DIAGNOSIS — R58 Hemorrhage, not elsewhere classified: Secondary | ICD-10-CM | POA: Diagnosis not present

## 2020-09-15 ENCOUNTER — Other Ambulatory Visit: Payer: Self-pay

## 2020-09-15 ENCOUNTER — Ambulatory Visit (INDEPENDENT_AMBULATORY_CARE_PROVIDER_SITE_OTHER): Payer: Medicare Other

## 2020-09-15 ENCOUNTER — Ambulatory Visit (INDEPENDENT_AMBULATORY_CARE_PROVIDER_SITE_OTHER): Payer: Medicare Other | Admitting: Podiatry

## 2020-09-15 DIAGNOSIS — L02619 Cutaneous abscess of unspecified foot: Secondary | ICD-10-CM | POA: Diagnosis not present

## 2020-09-15 DIAGNOSIS — L97519 Non-pressure chronic ulcer of other part of right foot with unspecified severity: Secondary | ICD-10-CM | POA: Diagnosis not present

## 2020-09-15 DIAGNOSIS — I83015 Varicose veins of right lower extremity with ulcer other part of foot: Secondary | ICD-10-CM

## 2020-09-15 DIAGNOSIS — L03119 Cellulitis of unspecified part of limb: Secondary | ICD-10-CM

## 2020-09-27 ENCOUNTER — Other Ambulatory Visit: Payer: Self-pay

## 2020-09-27 ENCOUNTER — Ambulatory Visit (INDEPENDENT_AMBULATORY_CARE_PROVIDER_SITE_OTHER): Payer: Medicare Other | Admitting: Podiatry

## 2020-09-27 DIAGNOSIS — L97519 Non-pressure chronic ulcer of other part of right foot with unspecified severity: Secondary | ICD-10-CM | POA: Diagnosis not present

## 2020-09-27 DIAGNOSIS — L02619 Cutaneous abscess of unspecified foot: Secondary | ICD-10-CM | POA: Diagnosis not present

## 2020-09-27 DIAGNOSIS — I83015 Varicose veins of right lower extremity with ulcer other part of foot: Secondary | ICD-10-CM | POA: Diagnosis not present

## 2020-09-27 DIAGNOSIS — L03119 Cellulitis of unspecified part of limb: Secondary | ICD-10-CM

## 2020-09-27 MED ORDER — DOXYCYCLINE HYCLATE 100 MG PO TABS: 100.0000 mg | ORAL_TABLET | Freq: Two times a day (BID) | ORAL | 0 refills | Status: DC

## 2020-09-27 NOTE — Progress Notes (Addendum)
   Subjective:  75 y.o. male presenting today for follow-up evaluation of venous ulcer to the right foot.  Patient was seen in the office about 10 days ago with severe maceration and skin breakdown to the right foot up to the level of the ankle.  It was recommended that he begin applying Betadine daily.  His neighbor is a Marine scientist and is able to apply the dressings.  He presents for further treatment evaluation  Past Medical History:  Diagnosis Date   Cellulitis    recurrent   Diastolic dysfunction    DVT (deep venous thrombosis) (HCC)    20 yrs ago   Enlarged prostate    Hyperlipidemia    Hypertension    Left ventricular hypertrophy    Migraines    H/O   Obesity    Osteoarthritis    Stroke ([facility])    11-2008 no residual problems   Thyroid disease    hypo   Transient ischemic attack (TIA)    presumed        Objective/Physical Exam General: The patient is alert and oriented x3 in no acute distress.  Dermatology: Maceration of the skin with superficial breakdown throughout the dorsal lateral foot and lateral aspect of the ankle right.  Wound base appears granular.  There is an extensive amount of necrotic and eschar debris overlying the wound superficially.   Vascular: Chronic lymphedema noted bilateral lower extremities  Neurological: Epicritic and protective threshold diminished bilaterally.   Musculoskeletal Exam: No significant contributory pedal deformity noted  Radiographic exam: No evidence of osteomyelitis to the pedal structures of the foot.  Diffuse osteopenia and demineralization noted.  Joint spaces appear mostly preserved.  Soft tissue edema noted.  There is some cortical irregularity along the dorsum of the TN joint on lateral view however this does appear to be more chronic in nature and secondary to more arthritic changes versus an osteomyelitis since the wounds do not probe to bone  Assessment: #1 ulcer right lower extremity secondary to venous insufficiency #2  varicosities bilateral lower extremities #3 history of DVT 20+ years ago  Plan of Care:  #1 Patient was evaluated. #2 light debridement of the area was performed.  Continue Betadine wet-to-dry dressings daily #3 order placed for home health nurse dressing changes.  Recommend Betadine wet-to-dry dressings daily #4 patient is on chronic use of Bactrim daily.  Continue. #5 return to clinic in 2 weeks    Edrick Kins, DPM [facility]  Dr. Edrick Kins, [address] [place]                                        [place], [place] [address]                Office [phone]  Fax [phone]

## 2020-09-27 NOTE — Progress Notes (Signed)
   Subjective:  75 y.o. male presenting today for follow-up evaluation of venous ulcer to the right foot.  Patient states that he has not been able to apply the Betadine wet-to-dry dressings daily.  There has been some improvement he is noticed however.  No new complaints at this time.  He states that he has not been contacted for the home health nursing agency  Past Medical History:  Diagnosis Date   Cellulitis    recurrent   Diastolic dysfunction    DVT (deep venous thrombosis) (HCC)    20 yrs ago   Enlarged prostate    Hyperlipidemia    Hypertension    Left ventricular hypertrophy    Migraines    H/O   Obesity    Osteoarthritis    Stroke ([place] [facility])    11-2008 no residual problems   Thyroid disease    hypo   Transient ischemic attack (TIA)    presumed     Objective/Physical Exam General: The patient is alert and oriented x3 in no acute distress.  Dermatology: Maceration of the skin with superficial breakdown throughout the dorsal lateral foot and lateral aspect of the ankle right.  Wound base appears granular.  There is an extensive amount of necrotic and eschar debris overlying the wound superficially.  Overall there is some improvement since last visit   Vascular: Chronic lymphedema noted bilateral lower extremities  Neurological: Epicritic and protective threshold diminished bilaterally.   Musculoskeletal Exam: No significant contributory pedal deformity noted  Radiographic exam taken last visit: No evidence of osteomyelitis to the pedal structures of the foot.  Diffuse osteopenia and demineralization noted.  Joint spaces appear mostly preserved.  Soft tissue edema noted.  There is some cortical irregularity along the dorsum of the TN joint on lateral view however this does appear to be more chronic in nature and secondary to more arthritic changes versus an osteomyelitis since the wounds do not probe to bone  Assessment: #1 ulcer right lower extremity secondary to venous  insufficiency #2 varicosities bilateral lower extremities #3 history of DVT 20+ years ago  Plan of Care:  #1 Patient was evaluated. #2 light debridement of the area was performed.  Continue Betadine wet-to-dry dressings daily.  Superficial eschar lesions were cut and removed from the diffuse wound of the foot and ankle #3  Orders for home health nurse dressing changes are pending.  We will follow-up.  Patient has not been contacted by the nursing agency recommend Betadine wet-to-dry dressings daily #4 patient is on chronic use of Bactrim daily.  Today were going to add doxycycline 100 mg 2 times daily #5  Culture was taken to the dorsum of the right foot area of superficial wound and maceration of the skin and sent to pathology for culture and sensitivities #6 patient is followed and monitored by vein and vascular, Dr. Doren Custard. #7 return to clinic in 2 weeks    Edrick Kins, DPM [facility]  Dr. Edrick Kins, DPM    [address], [place] [address]                Office [phone]  Fax [phone]

## 2020-09-29 ENCOUNTER — Telehealth: Payer: Self-pay | Admitting: *Deleted

## 2020-09-29 ENCOUNTER — Ambulatory Visit: Payer: Medicare Other | Admitting: Podiatry

## 2020-09-29 NOTE — Telephone Encounter (Signed)
Patient's caretaker (Ruth)is calling for dressing changing instructions.She did not quite understand the Betadine wet to dry dressing, is the gauze diluted with the betadine and saline?Please advise.

## 2020-09-30 ENCOUNTER — Encounter: Payer: Self-pay | Admitting: Podiatry

## 2020-09-30 ENCOUNTER — Ambulatory Visit: Payer: Medicare Other | Admitting: Vascular Surgery

## 2020-09-30 DIAGNOSIS — 419620001 Death: Secondary | SNOMED CT | POA: Diagnosis not present

## 2020-09-30 LAB — WOUND CULTURE
MICRO NUMBER:: 12304186
SPECIMEN QUALITY:: ADEQUATE

## 2020-09-30 DEATH — deceased

## 2020-10-01 ENCOUNTER — Telehealth: Payer: Self-pay | Admitting: *Deleted

## 2020-10-01 NOTE — Telephone Encounter (Signed)
Referral sent/ confirmation received 10/01/20.

## 2020-10-01 NOTE — Telephone Encounter (Signed)
Patient is calling about the status of home health nursing. Returned the call back to patient and informed that the referral had been sent to [facility] for review and will contact him as soon as they respond,verbalized understanding.

## 2020-10-05 ENCOUNTER — Telehealth: Payer: Self-pay | Admitting: *Deleted

## 2020-10-05 DIAGNOSIS — I872 Venous insufficiency (chronic) (peripheral): Secondary | ICD-10-CM | POA: Diagnosis not present

## 2020-10-05 DIAGNOSIS — Z8673 Personal history of transient ischemic attack (TIA), and cerebral infarction without residual deficits: Secondary | ICD-10-CM | POA: Diagnosis not present

## 2020-10-05 DIAGNOSIS — I10 Essential (primary) hypertension: Secondary | ICD-10-CM | POA: Diagnosis not present

## 2020-10-05 DIAGNOSIS — L97512 Non-pressure chronic ulcer of other part of right foot with fat layer exposed: Secondary | ICD-10-CM | POA: Diagnosis not present

## 2020-10-05 DIAGNOSIS — L02611 Cutaneous abscess of right foot: Secondary | ICD-10-CM | POA: Diagnosis not present

## 2020-10-05 DIAGNOSIS — L03115 Cellulitis of right lower limb: Secondary | ICD-10-CM | POA: Diagnosis not present

## 2020-10-05 DIAGNOSIS — I83015 Varicose veins of right lower extremity with ulcer other part of foot: Secondary | ICD-10-CM | POA: Diagnosis not present

## 2020-10-05 NOTE — Telephone Encounter (Signed)
Twice weekly is better than not at all. We'll take it. Thanks, Dr. Amalia Hailey

## 2020-10-05 NOTE — Telephone Encounter (Signed)
Benjamine Mola w/ [facility] is calling because they cannot accommodate the patient for daily wound care, insurance will not cover.   They have another option available which is to see patient twice weekly. Also patient has no other family members that can assist with his daily wound care needs. Please advise.

## 2020-10-06 NOTE — Telephone Encounter (Signed)
Returned the call to [place] with [phone]) no answer, left vmessage that the home health visits have been approved by Dr Amalia Hailey for twice weekly for wound care needs. Faxed order notes to office, 10/06/20

## 2020-10-11 ENCOUNTER — Other Ambulatory Visit: Payer: Self-pay

## 2020-10-11 ENCOUNTER — Ambulatory Visit (INDEPENDENT_AMBULATORY_CARE_PROVIDER_SITE_OTHER): Payer: Medicare Other | Admitting: Podiatry

## 2020-10-11 DIAGNOSIS — L97519 Non-pressure chronic ulcer of other part of right foot with unspecified severity: Secondary | ICD-10-CM

## 2020-10-11 DIAGNOSIS — I83015 Varicose veins of right lower extremity with ulcer other part of foot: Secondary | ICD-10-CM

## 2020-10-11 DIAGNOSIS — L03119 Cellulitis of unspecified part of limb: Secondary | ICD-10-CM

## 2020-10-11 DIAGNOSIS — L02619 Cutaneous abscess of unspecified foot: Secondary | ICD-10-CM

## 2020-10-12 ENCOUNTER — Telehealth: Payer: Self-pay | Admitting: *Deleted

## 2020-10-12 ENCOUNTER — Other Ambulatory Visit: Payer: Self-pay | Admitting: Podiatry

## 2020-10-12 MED ORDER — LEVOFLOXACIN 750 MG PO TABS: 750.0000 mg | ORAL_TABLET | Freq: Every day | ORAL | 0 refills | Status: DC

## 2020-10-12 MED ORDER — SILVER SULFADIAZINE 1 % EX CREA: 1.0000 "application " | TOPICAL_CREAM | Freq: Every day | CUTANEOUS | 1 refills | Status: DC

## 2020-10-12 NOTE — Telephone Encounter (Signed)
Patient is calling in because that he was suppose to get an antibiotic and some type of ointment sent to pharmac.Has called pharmacy and have not received as of yet. Please advise or send to pharmacy on file.

## 2020-10-12 NOTE — Progress Notes (Signed)
   Subjective:  75 y.o. male presenting today for follow-up evaluation of venous ulcer to the right foot.  Patient states that he has not been able to apply the Betadine wet-to-dry dressings daily.  There has been some improvement he is noticed however.  No new complaints at this time.  He states that he has not been contacted for the home health nursing agency  Past Medical History:  Diagnosis Date   Cellulitis    recurrent   Diastolic dysfunction    DVT (deep venous thrombosis) (HCC)    20 yrs ago   Enlarged prostate    Hyperlipidemia    Hypertension    Left ventricular hypertrophy    Migraines    H/O   Obesity    Osteoarthritis    Stroke ([facility])    11-2008 no residual problems   Thyroid disease    hypo   Transient ischemic attack (TIA)    presumed     Objective/Physical Exam General: The patient is alert and oriented x3 in no acute distress.  Dermatology: Maceration of the skin with superficial breakdown throughout the dorsal lateral foot and lateral aspect of the ankle right.  Wound base appears granular.  There is an extensive amount of necrotic and eschar debris overlying the wound superficially but this does appear significantly better since last visit.  Overall there is some improvement since last visit   Vascular: Chronic lymphedema noted bilateral lower extremities  Neurological: Epicritic and protective threshold diminished bilaterally.   Musculoskeletal Exam: No significant contributory pedal deformity noted  Assessment: #1 ulcer right lower extremity secondary to venous insufficiency #2 varicosities bilateral lower extremities #3 history of DVT 20+ years ago  Plan of Care:  #1 Patient was evaluated. #2 routine light debridement of the area was performed using a tissue nipper.  There continues to be some improvement..  Continue Betadine wet-to-dry dressings daily.  Superficial eschar lesions were cut and removed from the diffuse wound of the foot and ankle #3  patient has active home health nurse dressing change orders.  Continue Betadine wet-to-dry dressings daily. #4 patient is on chronic use of Bactrim daily.  Prescription for Levaquin 750 mg daily #14 based on cultures that were taken last visit #5  Culture was taken to the dorsum of the right foot area of superficial wound and maceration of the skin and sent to pathology for culture and sensitivities #6 patient is followed and monitored by vein and vascular, Dr. Doren Custard. #7 patient also requested a prescription for Silvadene cream to apply to the left foot for hydration purposes #8 return to clinic in 2 weeks    Casey Parker, DPM [facility]  Dr. Edrick Parker, DPM    [address], [place] [address]                Office [phone]  Fax [phone]

## 2020-10-13 MED ORDER — LEVOFLOXACIN 750 MG PO TABS: 750.0000 mg | ORAL_TABLET | Freq: Every day | ORAL | 0 refills | Status: DC

## 2020-10-13 NOTE — Telephone Encounter (Signed)
Rx was sent into the pharmacy last night.  I also just approved a request for it this morning.

## 2020-10-14 ENCOUNTER — Other Ambulatory Visit: Payer: Self-pay | Admitting: *Deleted

## 2020-10-15 DIAGNOSIS — L02611 Cutaneous abscess of right foot: Secondary | ICD-10-CM | POA: Diagnosis not present

## 2020-10-15 DIAGNOSIS — L97512 Non-pressure chronic ulcer of other part of right foot with fat layer exposed: Secondary | ICD-10-CM | POA: Diagnosis not present

## 2020-10-15 DIAGNOSIS — I872 Venous insufficiency (chronic) (peripheral): Secondary | ICD-10-CM | POA: Diagnosis not present

## 2020-10-15 DIAGNOSIS — I10 Essential (primary) hypertension: Secondary | ICD-10-CM | POA: Diagnosis not present

## 2020-10-15 DIAGNOSIS — L03115 Cellulitis of right lower limb: Secondary | ICD-10-CM | POA: Diagnosis not present

## 2020-10-15 DIAGNOSIS — I83015 Varicose veins of right lower extremity with ulcer other part of foot: Secondary | ICD-10-CM | POA: Diagnosis not present

## 2020-10-23 DIAGNOSIS — I872 Venous insufficiency (chronic) (peripheral): Secondary | ICD-10-CM | POA: Diagnosis not present

## 2020-10-23 DIAGNOSIS — L02611 Cutaneous abscess of right foot: Secondary | ICD-10-CM | POA: Diagnosis not present

## 2020-10-23 DIAGNOSIS — L97512 Non-pressure chronic ulcer of other part of right foot with fat layer exposed: Secondary | ICD-10-CM | POA: Diagnosis not present

## 2020-10-23 DIAGNOSIS — I10 Essential (primary) hypertension: Secondary | ICD-10-CM | POA: Diagnosis not present

## 2020-10-23 DIAGNOSIS — I83015 Varicose veins of right lower extremity with ulcer other part of foot: Secondary | ICD-10-CM | POA: Diagnosis not present

## 2020-10-23 DIAGNOSIS — L03115 Cellulitis of right lower limb: Secondary | ICD-10-CM | POA: Diagnosis not present

## 2020-10-29 DIAGNOSIS — L03115 Cellulitis of right lower limb: Secondary | ICD-10-CM | POA: Diagnosis not present

## 2020-10-29 DIAGNOSIS — I872 Venous insufficiency (chronic) (peripheral): Secondary | ICD-10-CM | POA: Diagnosis not present

## 2020-10-29 DIAGNOSIS — L02611 Cutaneous abscess of right foot: Secondary | ICD-10-CM | POA: Diagnosis not present

## 2020-10-29 DIAGNOSIS — I10 Essential (primary) hypertension: Secondary | ICD-10-CM | POA: Diagnosis not present

## 2020-10-29 DIAGNOSIS — I83015 Varicose veins of right lower extremity with ulcer other part of foot: Secondary | ICD-10-CM | POA: Diagnosis not present

## 2020-10-29 DIAGNOSIS — L97512 Non-pressure chronic ulcer of other part of right foot with fat layer exposed: Secondary | ICD-10-CM | POA: Diagnosis not present

## 2020-11-01 ENCOUNTER — Other Ambulatory Visit: Payer: Self-pay

## 2020-11-01 ENCOUNTER — Ambulatory Visit (INDEPENDENT_AMBULATORY_CARE_PROVIDER_SITE_OTHER): Payer: Medicare Other | Admitting: Podiatry

## 2020-11-01 DIAGNOSIS — L97519 Non-pressure chronic ulcer of other part of right foot with unspecified severity: Secondary | ICD-10-CM

## 2020-11-01 DIAGNOSIS — L03119 Cellulitis of unspecified part of limb: Secondary | ICD-10-CM

## 2020-11-01 DIAGNOSIS — M79675 Pain in left toe(s): Secondary | ICD-10-CM | POA: Diagnosis not present

## 2020-11-01 DIAGNOSIS — B351 Tinea unguium: Secondary | ICD-10-CM | POA: Diagnosis not present

## 2020-11-01 DIAGNOSIS — I83015 Varicose veins of right lower extremity with ulcer other part of foot: Secondary | ICD-10-CM

## 2020-11-01 DIAGNOSIS — M79674 Pain in right toe(s): Secondary | ICD-10-CM | POA: Diagnosis not present

## 2020-11-01 DIAGNOSIS — L02619 Cutaneous abscess of unspecified foot: Secondary | ICD-10-CM | POA: Diagnosis not present

## 2020-11-02 DIAGNOSIS — I10 Essential (primary) hypertension: Secondary | ICD-10-CM | POA: Diagnosis not present

## 2020-11-02 DIAGNOSIS — I83015 Varicose veins of right lower extremity with ulcer other part of foot: Secondary | ICD-10-CM | POA: Diagnosis not present

## 2020-11-02 DIAGNOSIS — L02611 Cutaneous abscess of right foot: Secondary | ICD-10-CM | POA: Diagnosis not present

## 2020-11-02 DIAGNOSIS — L03115 Cellulitis of right lower limb: Secondary | ICD-10-CM | POA: Diagnosis not present

## 2020-11-02 DIAGNOSIS — I872 Venous insufficiency (chronic) (peripheral): Secondary | ICD-10-CM | POA: Diagnosis not present

## 2020-11-02 DIAGNOSIS — L97512 Non-pressure chronic ulcer of other part of right foot with fat layer exposed: Secondary | ICD-10-CM | POA: Diagnosis not present

## 2020-11-09 NOTE — Progress Notes (Signed)
   SUBJECTIVE Patient presents to office today for follow-up evaluation of recurrent cellulitis to the right lower extremity with ulcers that have developed.  Patient states that he continues to do Betadine wet-to-dry dressings to the wounds of the right lower extremity.  He has noticed significant improvement.  He presents for further treatment and evaluation  Patient is also complaining of elongated, thickened nails that cause pain while ambulating in shoes.  Patient is unable to trim their own nails. Patient is here for further evaluation and treatment.  Past Medical History:  Diagnosis Date   Cellulitis    recurrent   Diastolic dysfunction    DVT (deep venous thrombosis) (HCC)    20 yrs ago   Enlarged prostate    Hyperlipidemia    Hypertension    Left ventricular hypertrophy    Migraines    H/O   Obesity    Osteoarthritis    Stroke ([facility])    11-2008 no residual problems   Thyroid disease    hypo   Transient ischemic attack (TIA)    presumed    OBJECTIVE General Patient is awake, alert, and oriented x 3 and in no acute distress. Derm the maceration of skin with superficial breakdown throughout the foot and ankle appears to be completely resolved.  Loosely adhered overlying eschar was lightly debrided and there is good evidence of fresh epithelialization and complete healing of the wounds.  Currently upon today's evaluation there is no open wounds Vasc chronic lymphedema noted bilateral lower extremities temperature gradient within normal limits.  Neuro Epicritic and protective threshold sensation diminished bilaterally.  Musculoskeletal Exam No significant contributory pedal deformity noted.  Muscular strength within normal limits.  ASSESSMENT 1.  Ulcer right lower extremity secondary to venous insufficiency; resolved  2.  Recurrent cellulitis right lower extremity; resolved  3.  Varicosities bilateral lower extremities  4.  History of DVT 20+ years ago  5.  Pain due to  onychomycosis of toenails both  PLAN OF CARE 1. Patient evaluated today.  Light debridement of the loosely adhered eschar was performed today which demonstrated complete healing of the underlying wounds.  I explained to the patient that the wounds and cellulitis of the foot are completely resolved.  There is no open wounds on his lower extremities.  Patient was very relieved and satisfied 2. Instructed to maintain good pedal hygiene and foot care.  3. Mechanical debridement of nails 1-5 bilaterally performed using a nail nipper. Filed with dremel without incident.  4. Return to clinic in 3 mos.    Edrick Kins, DPM [facility]  Dr. Edrick Kins, DPM    [address], [place] [address]                Office [phone]  Fax [phone]

## 2020-11-18 ENCOUNTER — Ambulatory Visit: Payer: Medicare Other | Admitting: Vascular Surgery

## 2020-11-29 DIAGNOSIS — E039 Hypothyroidism, unspecified: Secondary | ICD-10-CM | POA: Diagnosis not present

## 2020-11-29 DIAGNOSIS — I6529 Occlusion and stenosis of unspecified carotid artery: Secondary | ICD-10-CM | POA: Diagnosis not present

## 2020-11-29 DIAGNOSIS — I872 Venous insufficiency (chronic) (peripheral): Secondary | ICD-10-CM | POA: Diagnosis not present

## 2020-11-29 DIAGNOSIS — R202 Paresthesia of skin: Secondary | ICD-10-CM | POA: Diagnosis not present

## 2020-11-29 DIAGNOSIS — I10 Essential (primary) hypertension: Secondary | ICD-10-CM | POA: Diagnosis not present

## 2020-11-29 DIAGNOSIS — Z792 Long term (current) use of antibiotics: Secondary | ICD-10-CM | POA: Diagnosis not present

## 2020-11-29 DIAGNOSIS — E785 Hyperlipidemia, unspecified: Secondary | ICD-10-CM | POA: Diagnosis not present

## 2020-11-29 DIAGNOSIS — R600 Localized edema: Secondary | ICD-10-CM | POA: Diagnosis not present

## 2020-11-29 DIAGNOSIS — D692 Other nonthrombocytopenic purpura: Secondary | ICD-10-CM | POA: Diagnosis not present

## 2020-11-29 DIAGNOSIS — I6990 Unspecified sequelae of unspecified cerebrovascular disease: Secondary | ICD-10-CM | POA: Diagnosis not present

## 2020-11-29 DIAGNOSIS — Z23 Encounter for immunization: Secondary | ICD-10-CM | POA: Diagnosis not present

## 2020-12-08 ENCOUNTER — Ambulatory Visit (INDEPENDENT_AMBULATORY_CARE_PROVIDER_SITE_OTHER): Payer: Medicare Other | Admitting: Podiatry

## 2020-12-08 ENCOUNTER — Other Ambulatory Visit: Payer: Self-pay

## 2020-12-08 DIAGNOSIS — L989 Disorder of the skin and subcutaneous tissue, unspecified: Secondary | ICD-10-CM

## 2020-12-08 DIAGNOSIS — M79674 Pain in right toe(s): Secondary | ICD-10-CM | POA: Diagnosis not present

## 2020-12-08 DIAGNOSIS — M79675 Pain in left toe(s): Secondary | ICD-10-CM | POA: Diagnosis not present

## 2020-12-08 DIAGNOSIS — B351 Tinea unguium: Secondary | ICD-10-CM

## 2020-12-08 NOTE — Progress Notes (Signed)
   SUBJECTIVE Patient presents to office today complaining of elongated, thickened nails that cause pain while ambulating in shoes.  Patient is unable to trim their own nails.  Patient also has a symptomatic callus to the plantar aspect of the left foot that he would like debrided today.  Patient is here for further evaluation and treatment.  Past Medical History:  Diagnosis Date   Cellulitis    recurrent   Diastolic dysfunction    DVT (deep venous thrombosis) (HCC)    20 yrs ago   Enlarged prostate    Hyperlipidemia    Hypertension    Left ventricular hypertrophy    Migraines    H/O   Obesity    Osteoarthritis    Stroke ([place])    11-2008 no residual problems   Thyroid disease    hypo   Transient ischemic attack (TIA)    presumed    OBJECTIVE General Patient is awake, alert, and oriented x 3 and in no acute distress. Derm Skin is dry and supple bilateral. Negative open lesions or macerations. Remaining integument unremarkable. Nails are tender, long, thickened and dystrophic with subungual debris, consistent with onychomycosis, 1-5 bilateral. No signs of infection noted.  Hyperkeratotic preulcerative symptomatic callus lesion also noted to the plantar aspect of the left foot Vasc chronic lymphedema bilateral lower extremities neuro Epicritic and protective threshold sensation grossly intact bilaterally.  Musculoskeletal Exam No symptomatic pedal deformities noted bilateral. Muscular strength within normal limits.  ASSESSMENT 1.  Pain due to onychomycosis of toenails both  PLAN OF CARE 1. Patient evaluated today.  2. Instructed to maintain good pedal hygiene and foot care.  3. Mechanical debridement of nails 1-5 bilaterally performed using a nail nipper. Filed with dremel without incident.  4.  Excisional debridement of the hyperkeratotic callus tissue was performed using an tissue nipper without incident or bleeding  5.  Return to clinic in 3 mos.    Edrick Kins,  DPM [facility]  Dr. Edrick Kins, DPM    [address], [place] [address]                Office [phone]  Fax [phone]

## 2021-01-12 ENCOUNTER — Ambulatory Visit: Payer: Medicare Other | Admitting: Vascular Surgery

## 2021-02-07 DIAGNOSIS — M25561 Pain in right knee: Secondary | ICD-10-CM | POA: Diagnosis not present

## 2021-02-07 DIAGNOSIS — M25562 Pain in left knee: Secondary | ICD-10-CM | POA: Diagnosis not present

## 2021-02-25 ENCOUNTER — Other Ambulatory Visit: Payer: Self-pay | Admitting: Podiatry

## 2021-02-25 MED ORDER — DOXYCYCLINE HYCLATE 100 MG PO TABS: 100.0000 mg | ORAL_TABLET | Freq: Two times a day (BID) | ORAL | 0 refills | Status: AC

## 2021-02-25 MED ORDER — LEVOFLOXACIN 750 MG PO TABS: 750.0000 mg | ORAL_TABLET | Freq: Every day | ORAL | 0 refills | Status: DC

## 2021-02-28 ENCOUNTER — Telehealth: Payer: Self-pay | Admitting: *Deleted

## 2021-02-28 NOTE — Telephone Encounter (Signed)
Patient is calling for a appointment to f/u on his feet, spoke with the on call doctor over the weekend about his right foot, was prescribed an antibiotic and now his left foot is acting much the same as the right. Please call to schedule for foot pain f/u  appt.

## 2021-02-28 NOTE — Telephone Encounter (Signed)
Patient scheduled for 03/07/21 .  He Thanks you for sending in the antibiotics, if he has any other problems he will give [place] a call back.

## 2021-03-07 ENCOUNTER — Ambulatory Visit (INDEPENDENT_AMBULATORY_CARE_PROVIDER_SITE_OTHER): Payer: Medicare Other | Admitting: Podiatry

## 2021-03-07 ENCOUNTER — Other Ambulatory Visit: Payer: Self-pay

## 2021-03-07 DIAGNOSIS — L03119 Cellulitis of unspecified part of limb: Secondary | ICD-10-CM

## 2021-03-07 DIAGNOSIS — L02619 Cutaneous abscess of unspecified foot: Secondary | ICD-10-CM | POA: Diagnosis not present

## 2021-03-08 ENCOUNTER — Other Ambulatory Visit: Payer: Self-pay | Admitting: Podiatry

## 2021-03-08 NOTE — Telephone Encounter (Signed)
Patient is calling for a refill discussed 1 day ago.it was supposed to be sent to pharmacy on file, not there. Please advise.

## 2021-03-08 NOTE — Telephone Encounter (Signed)
Patient notified

## 2021-03-08 NOTE — Progress Notes (Signed)
Subjective:  76 y.o. male presenting today for follow-up evaluation of cellulitis recurrent to the bilateral feet.  Patient believes that there is significant improvement and good healing over the past week.  His nurse aide at home has been changing the dressings.  He presents for further treatment and evaluation  Past Medical History:  Diagnosis Date   Cellulitis    recurrent   Diastolic dysfunction    DVT (deep venous thrombosis) (HCC)    20 yrs ago   Enlarged prostate    Hyperlipidemia    Hypertension    Left ventricular hypertrophy    Migraines    H/O   Obesity    Osteoarthritis    Stroke ([place])    11-2008 no residual problems   Thyroid disease    hypo   Transient ischemic attack (TIA)    presumed   Current Outpatient Medications on File Prior to Visit  Medication Sig Dispense Refill   aspirin 325 MG tablet Take 1 tablet (325 mg total) by mouth 2 (two) times daily after a meal. 30 tablet 0   levothyroxine (SYNTHROID, LEVOTHROID) 125 MCG tablet Take 125 mcg by mouth daily.     losartan (COZAAR) 100 MG tablet Take 100 mg by mouth daily.     methocarbamol (ROBAXIN) 500 MG tablet Take 1 tablet (500 mg total) by mouth every 6 (six) hours as needed for muscle spasms. 60 tablet 0   Multiple Vitamin (MULTIVITAMIN) tablet Take 1 tablet by mouth daily.     Omega-3 Fatty Acids (FISH OIL PO) Take 1,600 Units by mouth daily.     oxyCODONE-acetaminophen (ROXICET) 5-325 MG per tablet Take 1-2 tablets by mouth every 4 (four) hours as needed. 60 tablet 0   psyllium (METAMUCIL) 58.6 % powder Take 1 packet by mouth daily.     silver sulfADIAZINE (SILVADENE) 1 % cream Apply 1 application topically daily. 400 g 1   simvastatin (ZOCOR) 20 MG tablet Take 20 mg by mouth daily.      Sulfamethoxazole-Trimethoprim (SULFAMETHOXAZOLE-TMP DS PO) Take 1 tablet by mouth daily.     No current facility-administered medications on file prior to visit.   Allergies  Allergen Reactions   Augmentin  [Amoxicillin-Pot Clavulanate]    Other     bovine products -itching   Penicillins     Pt unsure if allergic to penicillin    Objective/Physical Exam General: The patient is alert and oriented x3 in no acute distress.  Dermatology: Maceration of the skin with superficial breakdown throughout the dorsal lateral foot and lateral aspect of the ankle right.  Wound base appears granular.  There is an extensive amount of necrotic and eschar debris overlying the wound superficially but this does appear significantly better since last visit.  Interdigital maceration also noted.  Overall there is some improvement since last visit   Vascular: Chronic lymphedema noted bilateral lower extremities  Neurological: Epicritic and protective threshold diminished bilaterally.   Musculoskeletal Exam: No significant contributory pedal deformity noted  Assessment: #1 ulcer right lower extremity secondary to venous insufficiency #2 varicosities bilateral lower extremities #3 history of DVT 20+ years ago  Plan of Care:  #1 Patient was evaluated. #2 routine light debridement of the area was performed using a tissue nipper.  There continues to be some improvement since he was last seen by my partner Dr. Blenda Mounts.  Continue Betadine wet-to-dry dressings daily.  #3 patient has active home health nurse dressing change orders.  Continue Betadine wet-to-dry dressings daily. #4 patient is  on chronic use of Bactrim daily.  Continue to be prescription for Levaquin 750 mg daily #14 based on cultures that were taken last visit #5  Return to clinic in 3 weeks    Edrick Kins, DPM [facility]  Dr. Edrick Kins, DPM    [address], [place] [address]                Office [phone]  Fax [phone]

## 2021-03-15 ENCOUNTER — Ambulatory Visit: Payer: Medicare Other | Admitting: Podiatry

## 2021-03-15 ENCOUNTER — Ambulatory Visit (INDEPENDENT_AMBULATORY_CARE_PROVIDER_SITE_OTHER): Payer: Medicare Other | Admitting: Infectious Disease

## 2021-03-15 ENCOUNTER — Encounter: Payer: Self-pay | Admitting: Infectious Disease

## 2021-03-15 ENCOUNTER — Other Ambulatory Visit: Payer: Self-pay

## 2021-03-15 VITALS — BP 130/83 | HR 116 | Temp 98.1°F | Resp 16

## 2021-03-15 DIAGNOSIS — Z96641 Presence of right artificial hip joint: Secondary | ICD-10-CM | POA: Diagnosis not present

## 2021-03-15 DIAGNOSIS — L03119 Cellulitis of unspecified part of limb: Secondary | ICD-10-CM

## 2021-03-15 DIAGNOSIS — M167 Other unilateral secondary osteoarthritis of hip: Secondary | ICD-10-CM

## 2021-03-15 DIAGNOSIS — I5189 Other ill-defined heart diseases: Secondary | ICD-10-CM

## 2021-03-15 DIAGNOSIS — L02619 Cutaneous abscess of unspecified foot: Secondary | ICD-10-CM

## 2021-03-15 DIAGNOSIS — I872 Venous insufficiency (chronic) (peripheral): Secondary | ICD-10-CM | POA: Diagnosis not present

## 2021-03-15 HISTORY — DX: Venous insufficiency (chronic) (peripheral): I87.2

## 2021-03-15 MED ORDER — CEFADROXIL 500 MG PO CAPS: 1000.0000 mg | ORAL_CAPSULE | Freq: Two times a day (BID) | ORAL | 11 refills | Status: DC

## 2021-03-15 NOTE — Progress Notes (Signed)
Subjective:  Reason for infectious disease consult: History of recurrent cellulitis on chronic antibiotics to prevent this  Requesting Physician: Shon Baton, MD   Patient ID: Casey Parker, male    DOB: 04/09/1945, 76 y.o.   MRN: [id number]  HPI  Casey Parker is a very pleasant (and loquacious) 76 year old Caucasian man with a history of DVT 20 years ago diastolic dysfunction morbid obesity osteoarthritis hypertension left ventricular hypertrophy who has venous stasis changes and apparently recurrent cellulitis.  He has apparently been on levofloxacin for nearly 25 years that was prescribed by provider from [place].  He is also more recently been also been followed by Daylene Katayama with Podiatry and had debridement of ulcers periodically.  Has been evaluated by vascular surgery as well and has upcoming appointments with them.  He was referred to our clinic to help with management of his recurrent cellulitis and also due to Dr. Keane Police concerns about the risks of fluoroquinolones in a patient of his age which I agree with.  I do not want to continue fluoroquinolones due to risk for C. difficile colitis tendinitis and instability of blood sugars encephalopathy.  Not clear to me when he has had recent cellulitis flare with fevers, chills and erythema.   He believes everything is due to "my DVT" but understands there is no acute DVT present now.  He certainly clearly has venous stasis dermatitis bilaterally.  He has osteoarthritis has been trying to lose weight to potentially have surgery eventually in part.  Suffering from pruritus of unknown cause recently and showed me areas where has been itching.  Is not clear to me what is caused that he has a history of penicillin allergy remotely but he is not certain of what happened.     Past Medical History:  Diagnosis Date   Cellulitis    recurrent   Diastolic dysfunction    DVT (deep venous thrombosis) (HCC)    20 yrs ago   Enlarged  prostate    Hyperlipidemia    Hypertension    Left ventricular hypertrophy    Migraines    H/O   Obesity    Osteoarthritis    Stroke ([place])    11-2008 no residual problems   Thyroid disease    hypo   Transient ischemic attack (TIA)    presumed    Past Surgical History:  Procedure Laterality Date   HAND SURGERY     right   HERNIA REPAIR     TONSILLECTOMY     TOTAL HIP ARTHROPLASTY Right 08/21/2014   Procedure: RIGHT TOTAL HIP ARTHROPLASTY ANTERIOR APPROACH;  Surgeon: Mcarthur Rossetti, MD;  Location: [facility];  Service: Orthopedics;  Laterality: Right;    Family History  Problem Relation Age of Onset   Hypertension Father       Social History   Socioeconomic History   Marital status: Married    Spouse name: Not on file   Number of children: Not on file   Years of education: Not on file   Highest education level: Not on file  Occupational History   Not on file  Tobacco Use   Smoking status: Former    Types: Cigarettes    Quit date: 01/31/1988    Years since quitting: 33.1   Smokeless tobacco: Never  Substance and Sexual Activity   Alcohol use: Yes    Comment: on social occassions   Drug use: No   Sexual activity: Not on file  Other Topics Concern  Not on file  Social History Narrative   Not on file   Social Determinants of Health   Financial Resource Strain: Not on file  Food Insecurity: Not on file  Transportation Needs: Not on file  Physical Activity: Not on file  Stress: Not on file  Social Connections: Not on file    Allergies  Allergen Reactions   Augmentin [Amoxicillin-Pot Clavulanate]    Other     bovine products -itching   Penicillins     Pt unsure if allergic to penicillin     Current Outpatient Medications:    aspirin 325 MG tablet, Take 1 tablet (325 mg total) by mouth 2 (two) times daily after a meal., Disp: 30 tablet, Rfl: 0   levofloxacin (LEVAQUIN) 750 MG tablet, TAKE ONE TABLET BY MOUTH DAILY, Disp: 14 tablet, Rfl: 0    levothyroxine (SYNTHROID, LEVOTHROID) 125 MCG tablet, Take 125 mcg by mouth daily., Disp: , Rfl:    losartan (COZAAR) 100 MG tablet, Take 100 mg by mouth daily., Disp: , Rfl:    methocarbamol (ROBAXIN) 500 MG tablet, Take 1 tablet (500 mg total) by mouth every 6 (six) hours as needed for muscle spasms., Disp: 60 tablet, Rfl: 0   Multiple Vitamin (MULTIVITAMIN) tablet, Take 1 tablet by mouth daily., Disp: , Rfl:    Omega-3 Fatty Acids (FISH OIL PO), Take 1,600 Units by mouth daily., Disp: , Rfl:    oxyCODONE-acetaminophen (ROXICET) 5-325 MG per tablet, Take 1-2 tablets by mouth every 4 (four) hours as needed., Disp: 60 tablet, Rfl: 0   psyllium (METAMUCIL) 58.6 % powder, Take 1 packet by mouth daily., Disp: , Rfl:    silver sulfADIAZINE (SILVADENE) 1 % cream, Apply 1 application topically daily., Disp: 400 g, Rfl: 1   simvastatin (ZOCOR) 20 MG tablet, Take 20 mg by mouth daily. , Disp: , Rfl:    Sulfamethoxazole-Trimethoprim (SULFAMETHOXAZOLE-TMP DS PO), Take 1 tablet by mouth daily., Disp: , Rfl:    Review of Systems  Constitutional:  Negative for activity change, appetite change, chills, diaphoresis, fatigue, fever and unexpected weight change.  HENT:  Negative for congestion, rhinorrhea, sinus pressure, sneezing, sore throat and trouble swallowing.   Eyes:  Negative for photophobia and visual disturbance.  Respiratory:  Negative for cough, chest tightness, shortness of breath, wheezing and stridor.   Cardiovascular:  Negative for chest pain and leg swelling.  Gastrointestinal:  Negative for abdominal distention, abdominal pain, anal bleeding, blood in stool, constipation, diarrhea, nausea and vomiting.  Genitourinary:  Negative for difficulty urinating, dysuria, flank pain and hematuria.  Musculoskeletal:  Negative for arthralgias, back pain, gait problem, joint swelling and myalgias.  Skin:  Positive for color change. Negative for pallor, rash and wound.  Neurological:  Negative for  dizziness, tremors, weakness and light-headedness.  Hematological:  Negative for adenopathy. Does not bruise/bleed easily.  Psychiatric/Behavioral:  Negative for agitation, behavioral problems, confusion, decreased concentration, dysphoric mood and sleep disturbance.       Objective:   Physical Exam Constitutional:      Appearance: He is well-developed. He is obese.  HENT:     Head: Normocephalic and atraumatic.  Eyes:     Conjunctiva/sclera: Conjunctivae normal.  Cardiovascular:     Rate and Rhythm: Normal rate and regular rhythm.  Pulmonary:     Effort: Pulmonary effort is normal. No respiratory distress.     Breath sounds: No wheezing.  Abdominal:     General: There is no distension.     Palpations: Abdomen is soft.  Musculoskeletal:        General: No tenderness.     Cervical back: Normal range of motion and neck supple.  Skin:    General: Skin is warm and dry.     Coloration: Skin is not pale.     Findings: No erythema or rash.  Neurological:     General: No focal deficit present.     Mental Status: He is alert and oriented to person, place, and time.  Psychiatric:        Mood and Affect: Mood normal.        Behavior: Behavior normal.        Thought Content: Thought content normal.        Judgment: Judgment normal.     Lower extremities 03/15/2021:  Right         Left           Assessment & Plan:   Venous stasis dermatitis with apparent recurrent cellulitis:  I would not change him over to cefadroxil 2 tablets twice daily which is an aggressive dose but less of a risk antibiotic than levofloxacin.  I plan on seeing him back in roughly 1 month's time and put him on the schedule in the afternoon.  He will continue follow-up with podiatry and vascular surgery as well.  Pruritus and itching: Can follow-up with Dr. Virgina Jock and dermatology if he would like.  Obesity: He is trying to work on losing further weight.  Diastolic dysfunction clearly this  is also an important feature of managing his deep venous stasis dermatitis along with compression stockings.   I spent 90 minutes with the patient including than 50% of the time in face to face counseling of the patient the nature of recurrent cellulitis the differential in this and stasis dermatitis the risks of different antibiotics including fluoroquinolones in particular for causing C. difficile colitis personally reviewing plain films of the foot along with review of medical records in preparation for the visit and during the visit and in coordination of his care.

## 2021-03-16 ENCOUNTER — Ambulatory Visit: Payer: Medicare Other | Admitting: Vascular Surgery

## 2021-04-04 ENCOUNTER — Other Ambulatory Visit: Payer: Self-pay

## 2021-04-04 ENCOUNTER — Ambulatory Visit (INDEPENDENT_AMBULATORY_CARE_PROVIDER_SITE_OTHER): Payer: Medicare Other | Admitting: Podiatry

## 2021-04-04 DIAGNOSIS — L03119 Cellulitis of unspecified part of limb: Secondary | ICD-10-CM | POA: Diagnosis not present

## 2021-04-04 DIAGNOSIS — L02619 Cutaneous abscess of unspecified foot: Secondary | ICD-10-CM

## 2021-04-04 DIAGNOSIS — R600 Localized edema: Secondary | ICD-10-CM | POA: Diagnosis not present

## 2021-04-05 ENCOUNTER — Other Ambulatory Visit: Payer: Self-pay | Admitting: Internal Medicine

## 2021-04-05 DIAGNOSIS — R350 Frequency of micturition: Secondary | ICD-10-CM

## 2021-04-05 DIAGNOSIS — R300 Dysuria: Secondary | ICD-10-CM

## 2021-04-06 ENCOUNTER — Ambulatory Visit
Admission: RE | Admit: 2021-04-06 | Discharge: 2021-04-06 | Disposition: A | Discharge status: Home / Self Care | Payer: Medicare Other | Source: Ambulatory Visit | Attending: Internal Medicine | Admitting: Internal Medicine

## 2021-04-06 ENCOUNTER — Other Ambulatory Visit: Payer: Self-pay | Admitting: Internal Medicine

## 2021-04-06 DIAGNOSIS — R300 Dysuria: Secondary | ICD-10-CM

## 2021-04-06 DIAGNOSIS — R350 Frequency of micturition: Secondary | ICD-10-CM

## 2021-04-11 NOTE — Progress Notes (Signed)
? ?Subjective:  ?76 y.o. male presenting today for follow-up evaluation of cellulitis recurrent to the bilateral feet.  Patient states that he is doing well and there has been improvement.  Patient has completed the Levaquin 750 mg that was prescribed.  He is on chronic use of Bactrim daily as per his PCP.  No new complaints at this time ? ?Past Medical History:  ?Diagnosis Date  ? Cellulitis   ? recurrent  ? Diastolic dysfunction   ? DVT (deep venous thrombosis) ([place])   ? 20 yrs ago  ? Enlarged prostate   ? Hyperlipidemia   ? Hypertension   ? Left ventricular hypertrophy   ? Migraines   ? H/O  ? Obesity   ? Osteoarthritis   ? Stroke [facility])   ? 11-2008 no residual problems  ? Thyroid disease   ? hypo  ? Transient ischemic attack (TIA)   ? presumed  ? Venous stasis dermatitis 03/15/2021  ? ?Current Outpatient Medications on File Prior to Visit  ?Medication Sig Dispense Refill  ? aspirin 325 MG tablet Take 1 tablet (325 mg total) by mouth 2 (two) times daily after a meal. 30 tablet 0  ? cefadroxil (DURICEF) 500 MG capsule Take 2 capsules (1,000 mg total) by mouth 2 (two) times daily. 120 capsule 11  ? levothyroxine (SYNTHROID, LEVOTHROID) 125 MCG tablet Take 125 mcg by mouth daily.    ? losartan (COZAAR) 100 MG tablet Take 100 mg by mouth daily.    ? methocarbamol (ROBAXIN) 500 MG tablet Take 1 tablet (500 mg total) by mouth every 6 (six) hours as needed for muscle spasms. 60 tablet 0  ? Multiple Vitamin (MULTIVITAMIN) tablet Take 1 tablet by mouth daily.    ? Omega-3 Fatty Acids (FISH OIL PO) Take 1,600 Units by mouth daily.    ? oxyCODONE-acetaminophen (ROXICET) 5-325 MG per tablet Take 1-2 tablets by mouth every 4 (four) hours as needed. 60 tablet 0  ? psyllium (METAMUCIL) 58.6 % powder Take 1 packet by mouth daily.    ? silver sulfADIAZINE (SILVADENE) 1 % cream Apply 1 application topically daily. 400 g 1  ? simvastatin (ZOCOR) 20 MG tablet Take 20 mg by mouth daily.     ? Sulfamethoxazole-Trimethoprim  (SULFAMETHOXAZOLE-TMP DS PO) Take 1 tablet by mouth daily.    ? ?No current facility-administered medications on file prior to visit.  ? ?Allergies  ?Allergen Reactions  ? Augmentin [Amoxicillin-Pot Clavulanate]   ? Other   ?  bovine products -itching  ? Penicillins   ?  Pt unsure if allergic to penicillin  ? ? ?Objective/Physical Exam ?General: The patient is alert and oriented x3 in no acute distress. ? ?Dermatology: Maceration of the skin with superficial breakdown appears to be resolved to the right lower extremity.  No open wounds noted today. ? ?Vascular: Chronic lymphedema noted bilateral lower extremities ? ?Neurological: Epicritic and protective threshold diminished bilaterally.  ? ?Musculoskeletal Exam: No significant contributory pedal deformity noted ? ?Assessment: ?#1 ulcer right lower extremity secondary to venous insufficiency ?#2 varicosities bilateral lower extremities ?#3 history of DVT 20+ years ago ? ?Plan of Care:  ?#1 Patient was evaluated. ?#2  Today in the open wounds and macerated skin to the feet have resolved.  No current skin breakdown or ulcers noted ?#3 recommend daily compression for the chronic bilateral lower extremity edema. ?#4 return to clinic 3 months for next scheduled routine foot care appointment ? ? ?Edrick Kins, DPM ?[place] ? ?  Dr. Edrick Kins, DPM  ?  ?[address]                                      ?[place], [place] [address]                ?Office [phone]  ?Fax [phone] ? ? ? ? ? ?

## 2021-04-18 ENCOUNTER — Other Ambulatory Visit: Payer: Self-pay

## 2021-04-18 ENCOUNTER — Ambulatory Visit (INDEPENDENT_AMBULATORY_CARE_PROVIDER_SITE_OTHER): Payer: Medicare Other | Admitting: Infectious Disease

## 2021-04-18 ENCOUNTER — Encounter: Payer: Self-pay | Admitting: Infectious Disease

## 2021-04-18 VITALS — BP 110/68 | HR 68 | Temp 97.1°F | Resp 16 | Ht 69.0 in | Wt 306.0 lb

## 2021-04-18 DIAGNOSIS — I10 Essential (primary) hypertension: Secondary | ICD-10-CM | POA: Diagnosis not present

## 2021-04-18 DIAGNOSIS — L988 Other specified disorders of the skin and subcutaneous tissue: Secondary | ICD-10-CM

## 2021-04-18 DIAGNOSIS — L03119 Cellulitis of unspecified part of limb: Secondary | ICD-10-CM | POA: Diagnosis not present

## 2021-04-18 DIAGNOSIS — L304 Erythema intertrigo: Secondary | ICD-10-CM

## 2021-04-18 DIAGNOSIS — L02619 Cutaneous abscess of unspecified foot: Secondary | ICD-10-CM | POA: Diagnosis not present

## 2021-04-18 DIAGNOSIS — I872 Venous insufficiency (chronic) (peripheral): Secondary | ICD-10-CM

## 2021-04-18 HISTORY — DX: Erythema intertrigo: L30.4

## 2021-04-18 MED ORDER — NYSTATIN 100000 UNIT/GM EX CREA: 1.0000 "application " | TOPICAL_CREAM | Freq: Two times a day (BID) | CUTANEOUS | 0 refills | Status: DC

## 2021-04-18 MED ORDER — NYSTATIN 100000 UNIT/GM EX POWD
1.0000 | Freq: Three times a day (TID) | CUTANEOUS | 1 refills | Status: DC
Start: 2021-04-18 — End: 2021-04-18

## 2021-04-18 MED ORDER — CEFADROXIL 500 MG PO CAPS: 500.0000 mg | ORAL_CAPSULE | Freq: Two times a day (BID) | ORAL | 11 refills | Status: DC

## 2021-04-18 NOTE — Progress Notes (Signed)
? ?Subjective:  ?Chief complaint: intertrigo ? ? Patient ID: Casey Parker, male    DOB: [birth year], 76 y.o.   MRN: [id number] ? ?HPI ? ?Casey Parker is a very pleasant 76 year old Caucasian man with a history of DVT 20 years ago diastolic dysfunction morbid obesity osteoarthritis hypertension left ventricular hypertrophy who has venous stasis changes and apparently recurrent cellulitis. ? ?It was my impression when he was first referred to me that he had been on levofloxacin for nearly 25 years that was prescribed by provider from [place]. ? ?He is also more recently been also been followed by Casey Parker with Podiatry and had debridement of ulcers periodically.  Has been evaluated by vascular surgery as well and has upcoming appointments with them. ? ?He was referred to our clinic to help with management of his recurrent cellulitis and also due to Dr. Keane Parker concerns about the risks of fluoroquinolones in a patient of his age which I agreed with. ? ?I did not want to continue fluoroquinolones due to risk for C. difficile colitis tendinitis and instability of blood sugars encephalopathy. ? ? ?I switched him over to cefadroxil 2 tablets twice daily.  ? ?In the interim he developed intertrigo and then fluconazole for 4 days. ? ?Now thanks to our pharmacy student Casey Parker we have discovered that in actual fact Casey Parker. ? ?He had been taking bactrim DS once daily but then at times would develop cellulitis and go up to twice daily and would eventually see resolution in his symptoms. ? ? ? ? ? ? ?Past Medical History:  ?Diagnosis Date  ? Cellulitis   ? recurrent  ? Diastolic dysfunction   ? DVT (deep venous thrombosis) ([place])   ? 20 yrs ago  ? Enlarged prostate   ? Hyperlipidemia   ? Hypertension   ? Left ventricular hypertrophy   ? Migraines   ? H/O  ? Obesity   ? Osteoarthritis   ? Stroke [facility])   ? 11-2008 no residual problems  ? Thyroid disease   ? hypo  ? Transient ischemic attack  (TIA)   ? presumed  ? Venous stasis dermatitis 03/15/2021  ? ? ?Past Surgical History:  ?Procedure Laterality Date  ? HAND SURGERY    ? right  ? HERNIA REPAIR    ? TONSILLECTOMY    ? TOTAL HIP ARTHROPLASTY Right 08/21/2014  ? Procedure: RIGHT TOTAL HIP ARTHROPLASTY ANTERIOR APPROACH;  Surgeon: Mcarthur Rossetti, MD;  Location: [facility];  Service: Orthopedics;  Laterality: Right;  ? ? ?Family History  ?Problem Relation Age of Onset  ? Hypertension Father   ? ? ?  ?Social History  ? ?Socioeconomic History  ? Marital status: Married  ?  Spouse name: Not on file  ? Number of children: Not on file  ? Years of education: Not on file  ? Highest education level: Not on file  ?Occupational History  ? Not on file  ?Tobacco Use  ? Smoking status: Former  ?  Types: Cigarettes  ?  Quit date: 01/31/1988  ?  Years since quitting: 33.2  ? Smokeless tobacco: Never  ?Substance and Sexual Activity  ? Alcohol use: Yes  ?  Comment: on social occassions  ? Drug use: No  ? Sexual activity: Not on file  ?Other Topics Concern  ? Not on file  ?Social History Narrative  ? Not on file  ? ?Social Determinants of Health  ? ?Financial Resource Strain: Not on  file  ?Food Insecurity: Not on file  ?Transportation Needs: Not on file  ?Physical Activity: Not on file  ?Stress: Not on file  ?Social Connections: Not on file  ? ? ?Allergies  ?Allergen Reactions  ? Augmentin [Amoxicillin-Pot Clavulanate]   ? Other   ?  bovine products -itching  ? Penicillins   ?  Pt unsure if allergic to penicillin  ? ? ? ?Current Outpatient Medications:  ?  aspirin 325 MG tablet, Take 1 tablet (325 mg total) by mouth 2 (two) times daily after a meal., Disp: 30 tablet, Rfl: 0 ?  cefadroxil (DURICEF) 500 MG capsule, Take 2 capsules (1,000 mg total) by mouth 2 (two) times daily., Disp: 120 capsule, Rfl: 11 ?  levothyroxine (SYNTHROID, LEVOTHROID) 125 MCG tablet, Take 125 mcg by mouth daily., Disp: , Rfl:  ?  losartan (COZAAR) 100 MG tablet, Take 100 mg by mouth daily., Disp:  , Rfl:  ?  methocarbamol (ROBAXIN) 500 MG tablet, Take 1 tablet (500 mg total) by mouth every 6 (six) hours as needed for muscle spasms., Disp: 60 tablet, Rfl: 0 ?  Multiple Vitamin (MULTIVITAMIN) tablet, Take 1 tablet by mouth daily., Disp: , Rfl:  ?  Omega-3 Fatty Acids (FISH OIL PO), Take 1,600 Units by mouth daily., Disp: , Rfl:  ?  oxyCODONE-acetaminophen (ROXICET) 5-325 MG per tablet, Take 1-2 tablets by mouth every 4 (four) hours as needed., Disp: 60 tablet, Rfl: 0 ?  psyllium (METAMUCIL) 58.6 % powder, Take 1 packet by mouth daily., Disp: , Rfl:  ?  silver sulfADIAZINE (SILVADENE) 1 % cream, Apply 1 application topically daily., Disp: 400 g, Rfl: 1 ?  simvastatin (ZOCOR) 20 MG tablet, Take 20 mg by mouth daily. , Disp: , Rfl:  ?  Sulfamethoxazole-Trimethoprim (SULFAMETHOXAZOLE-TMP DS PO), Take 1 tablet by mouth daily., Disp: , Rfl:  ? ? ?Review of Systems  ?Constitutional:  Negative for activity change, appetite change, chills, diaphoresis, fatigue, fever and unexpected weight change.  ?HENT:  Negative for congestion, rhinorrhea, sinus pressure, sneezing, sore throat and trouble swallowing.   ?Eyes:  Negative for photophobia and visual disturbance.  ?Respiratory:  Negative for cough, chest tightness, shortness of breath, wheezing and stridor.   ?Cardiovascular:  Negative for chest pain, palpitations and leg swelling.  ?Gastrointestinal:  Negative for abdominal distention, abdominal pain, anal bleeding, blood in stool, constipation, diarrhea, nausea and vomiting.  ?Genitourinary:  Negative for difficulty urinating, dysuria, flank pain and hematuria.  ?Musculoskeletal:  Negative for arthralgias, back pain, gait problem, joint swelling and myalgias.  ?Skin:  Negative for color change, pallor, rash and wound.  ?Neurological:  Negative for dizziness, tremors, weakness and light-headedness.  ?Hematological:  Negative for adenopathy. Does not bruise/bleed easily.  ?Psychiatric/Behavioral:  Negative for agitation,  behavioral problems, confusion, decreased concentration, dysphoric mood and sleep disturbance.   ? ?   ?Objective:  ? Physical Exam ?Constitutional:   ?   Appearance: He is well-developed.  ?HENT:  ?   Head: Normocephalic and atraumatic.  ?Eyes:  ?   Conjunctiva/sclera: Conjunctivae normal.  ?Cardiovascular:  ?   Rate and Rhythm: Normal rate and regular rhythm.  ?Pulmonary:  ?   Effort: Pulmonary effort is normal. No respiratory distress.  ?   Breath sounds: No wheezing.  ?Abdominal:  ?   General: There is no distension.  ?   Palpations: Abdomen is soft.  ?Musculoskeletal:     ?   General: No tenderness. Normal range of motion.  ?   Cervical back: Normal  range of motion and neck supple.  ?Skin: ?   General: Skin is warm and dry.  ?   Coloration: Skin is not pale.  ?   Findings: No erythema or rash.  ?Neurological:  ?   General: No focal deficit present.  ?   Mental Status: He is alert and oriented to person, place, and time.  ?Psychiatric:     ?   Mood and Affect: Mood normal.     ?   Behavior: Behavior normal.     ?   Thought Content: Thought content normal.     ?   Judgment: Judgment normal.  ? ? ? ?Lower extremities 03/15/2021: ? ?Right ? ? ? ? ? ? ? ? ?Left ? ? ? ? ? ? ?Ankles examined today and remain unchanged ?   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 ?I am concerned that the cefadroxil has been causing him a yeast infection likely because it is a broader antibiotic than Bactrim was and I mistakenly thought he was on levofloxacin based on my reading of the notes in chart. ? ?He tells me he was only given levofloxacin intermittently but the Bactrim is the chronic antibiotic. ? ?For now we will go to a lower dose of cefadroxil 500 twice daily. ? ?If he continues to have problems with yeast infections I would favor going to cefadroxil once a day as a suppressive antibiotic and having more of it around on hand to treat flares of cellulitis which typically be more  likely caused by streptococcal species or staphylococcal species and for which Bactrim will be a less good antibiotic. ? ? ?Intertrigo: I have given a prescription for nystatin powder but the insurance compa

## 2021-04-25 ENCOUNTER — Telehealth: Payer: Self-pay

## 2021-04-25 NOTE — Telephone Encounter (Signed)
Pt called stating that he is still have a yeast infection and wanted to know can Dr. Tommy Medal cut his cefadroxil down to one a day instead of the current 2x daily. He states that the nystatin power does work, but he would like to have a bigger bottle as the current bottle runs out quick. He is aware that his insurance does not pay for the powder form and he has tried the nystatin cream, which he states make a mess everywhere and he would rather use the powder.  ? ?Pt also states that his scrotum size is not deceasing and wants to know what can help with that issue. He is taking Flomax and will need a refill, I informed the pt that Dr. Lucianne Lei dam did not prescribe this medication and will possibly have to request a refill from the provider who prescribed it. ? ? ?

## 2021-04-26 ENCOUNTER — Telehealth: Payer: Self-pay | Admitting: Infectious Disease

## 2021-04-26 ENCOUNTER — Other Ambulatory Visit: Payer: Self-pay | Admitting: Infectious Disease

## 2021-04-26 MED ORDER — FLUCONAZOLE 100 MG PO TABS: 100.0000 mg | ORAL_TABLET | Freq: Every day | ORAL | 0 refills | Status: DC

## 2021-04-26 NOTE — Telephone Encounter (Signed)
Spoke with patient, advised him that he cannot take his simvastatin and fluconazole together and that he will need to stop the simvastatin for 2 weeks. Patient verbalized understanding.  ? ?Beryle Flock, RN ? ?

## 2021-04-26 NOTE — Telephone Encounter (Signed)
I have called in fluconazole for 2 weeks for Casey Parker. He DOES have to stop his zocor (simvastatin) while on the fluconazole ?

## 2021-04-26 NOTE — Telephone Encounter (Signed)
Called pt to relay message below per Dr. Tommy Medal. Pt is ok with taking the cefadroxil once a day and wants to have the fluconazole called into his [facility]. ?

## 2021-04-26 NOTE — Telephone Encounter (Signed)
Left voicemail asking patient to return my call.   Field Staniszewski P Donoven Pett, CMA  

## 2021-05-09 DIAGNOSIS — Z86718 Personal history of other venous thrombosis and embolism: Secondary | ICD-10-CM | POA: Diagnosis not present

## 2021-05-09 DIAGNOSIS — M170 Bilateral primary osteoarthritis of knee: Secondary | ICD-10-CM | POA: Diagnosis not present

## 2021-05-09 DIAGNOSIS — Z87891 Personal history of nicotine dependence: Secondary | ICD-10-CM | POA: Diagnosis not present

## 2021-05-09 DIAGNOSIS — E785 Hyperlipidemia, unspecified: Secondary | ICD-10-CM | POA: Diagnosis not present

## 2021-05-09 DIAGNOSIS — G43909 Migraine, unspecified, not intractable, without status migrainosus: Secondary | ICD-10-CM | POA: Diagnosis not present

## 2021-05-09 DIAGNOSIS — I890 Lymphedema, not elsewhere classified: Secondary | ICD-10-CM | POA: Diagnosis not present

## 2021-05-09 DIAGNOSIS — I872 Venous insufficiency (chronic) (peripheral): Secondary | ICD-10-CM | POA: Diagnosis not present

## 2021-05-09 DIAGNOSIS — M545 Low back pain, unspecified: Secondary | ICD-10-CM | POA: Diagnosis not present

## 2021-05-09 DIAGNOSIS — M25511 Pain in right shoulder: Secondary | ICD-10-CM | POA: Diagnosis not present

## 2021-05-09 DIAGNOSIS — Z9089 Acquired absence of other organs: Secondary | ICD-10-CM | POA: Diagnosis not present

## 2021-05-09 DIAGNOSIS — Z8673 Personal history of transient ischemic attack (TIA), and cerebral infarction without residual deficits: Secondary | ICD-10-CM | POA: Diagnosis not present

## 2021-05-09 DIAGNOSIS — Z9181 History of falling: Secondary | ICD-10-CM | POA: Diagnosis not present

## 2021-05-09 DIAGNOSIS — G8929 Other chronic pain: Secondary | ICD-10-CM | POA: Diagnosis not present

## 2021-05-09 DIAGNOSIS — I119 Hypertensive heart disease without heart failure: Secondary | ICD-10-CM | POA: Diagnosis not present

## 2021-05-09 DIAGNOSIS — N400 Enlarged prostate without lower urinary tract symptoms: Secondary | ICD-10-CM | POA: Diagnosis not present

## 2021-05-09 DIAGNOSIS — M25512 Pain in left shoulder: Secondary | ICD-10-CM | POA: Diagnosis not present

## 2021-05-09 DIAGNOSIS — E039 Hypothyroidism, unspecified: Secondary | ICD-10-CM | POA: Diagnosis not present

## 2021-05-09 DIAGNOSIS — Z96641 Presence of right artificial hip joint: Secondary | ICD-10-CM | POA: Diagnosis not present

## 2021-05-12 NOTE — Telephone Encounter (Signed)
Patient has opted to hold off on fluconazole. He understands that he will resume Zocor at this time.  ?Thanks ?

## 2021-05-16 DIAGNOSIS — I119 Hypertensive heart disease without heart failure: Secondary | ICD-10-CM | POA: Diagnosis not present

## 2021-05-16 DIAGNOSIS — M545 Low back pain, unspecified: Secondary | ICD-10-CM | POA: Diagnosis not present

## 2021-05-16 DIAGNOSIS — E785 Hyperlipidemia, unspecified: Secondary | ICD-10-CM | POA: Diagnosis not present

## 2021-05-16 DIAGNOSIS — E039 Hypothyroidism, unspecified: Secondary | ICD-10-CM | POA: Diagnosis not present

## 2021-05-16 DIAGNOSIS — M25511 Pain in right shoulder: Secondary | ICD-10-CM | POA: Diagnosis not present

## 2021-05-16 DIAGNOSIS — M170 Bilateral primary osteoarthritis of knee: Secondary | ICD-10-CM | POA: Diagnosis not present

## 2021-05-17 DIAGNOSIS — E039 Hypothyroidism, unspecified: Secondary | ICD-10-CM | POA: Diagnosis not present

## 2021-05-17 DIAGNOSIS — M25511 Pain in right shoulder: Secondary | ICD-10-CM | POA: Diagnosis not present

## 2021-05-17 DIAGNOSIS — M545 Low back pain, unspecified: Secondary | ICD-10-CM | POA: Diagnosis not present

## 2021-05-17 DIAGNOSIS — E785 Hyperlipidemia, unspecified: Secondary | ICD-10-CM | POA: Diagnosis not present

## 2021-05-17 DIAGNOSIS — I119 Hypertensive heart disease without heart failure: Secondary | ICD-10-CM | POA: Diagnosis not present

## 2021-05-17 DIAGNOSIS — M170 Bilateral primary osteoarthritis of knee: Secondary | ICD-10-CM | POA: Diagnosis not present

## 2021-05-19 DIAGNOSIS — I872 Venous insufficiency (chronic) (peripheral): Secondary | ICD-10-CM | POA: Diagnosis not present

## 2021-05-19 DIAGNOSIS — G4733 Obstructive sleep apnea (adult) (pediatric): Secondary | ICD-10-CM | POA: Diagnosis not present

## 2021-05-19 DIAGNOSIS — R600 Localized edema: Secondary | ICD-10-CM | POA: Diagnosis not present

## 2021-05-19 DIAGNOSIS — M1990 Unspecified osteoarthritis, unspecified site: Secondary | ICD-10-CM | POA: Diagnosis not present

## 2021-05-19 DIAGNOSIS — I10 Essential (primary) hypertension: Secondary | ICD-10-CM | POA: Diagnosis not present

## 2021-05-19 DIAGNOSIS — Z792 Long term (current) use of antibiotics: Secondary | ICD-10-CM | POA: Diagnosis not present

## 2021-05-19 DIAGNOSIS — R202 Paresthesia of skin: Secondary | ICD-10-CM | POA: Diagnosis not present

## 2021-05-19 DIAGNOSIS — E785 Hyperlipidemia, unspecified: Secondary | ICD-10-CM | POA: Diagnosis not present

## 2021-05-19 DIAGNOSIS — E039 Hypothyroidism, unspecified: Secondary | ICD-10-CM | POA: Diagnosis not present

## 2021-05-19 DIAGNOSIS — I83893 Varicose veins of bilateral lower extremities with other complications: Secondary | ICD-10-CM | POA: Diagnosis not present

## 2021-05-20 DIAGNOSIS — M545 Low back pain, unspecified: Secondary | ICD-10-CM | POA: Diagnosis not present

## 2021-05-20 DIAGNOSIS — M25511 Pain in right shoulder: Secondary | ICD-10-CM | POA: Diagnosis not present

## 2021-05-20 DIAGNOSIS — E039 Hypothyroidism, unspecified: Secondary | ICD-10-CM | POA: Diagnosis not present

## 2021-05-20 DIAGNOSIS — I119 Hypertensive heart disease without heart failure: Secondary | ICD-10-CM | POA: Diagnosis not present

## 2021-05-20 DIAGNOSIS — M170 Bilateral primary osteoarthritis of knee: Secondary | ICD-10-CM | POA: Diagnosis not present

## 2021-05-20 DIAGNOSIS — E785 Hyperlipidemia, unspecified: Secondary | ICD-10-CM | POA: Diagnosis not present

## 2021-05-23 DIAGNOSIS — M25511 Pain in right shoulder: Secondary | ICD-10-CM | POA: Diagnosis not present

## 2021-05-23 DIAGNOSIS — M170 Bilateral primary osteoarthritis of knee: Secondary | ICD-10-CM | POA: Diagnosis not present

## 2021-05-23 DIAGNOSIS — E785 Hyperlipidemia, unspecified: Secondary | ICD-10-CM | POA: Diagnosis not present

## 2021-05-23 DIAGNOSIS — E039 Hypothyroidism, unspecified: Secondary | ICD-10-CM | POA: Diagnosis not present

## 2021-05-23 DIAGNOSIS — M545 Low back pain, unspecified: Secondary | ICD-10-CM | POA: Diagnosis not present

## 2021-05-23 DIAGNOSIS — I119 Hypertensive heart disease without heart failure: Secondary | ICD-10-CM | POA: Diagnosis not present

## 2021-05-27 DIAGNOSIS — M25511 Pain in right shoulder: Secondary | ICD-10-CM | POA: Diagnosis not present

## 2021-05-27 DIAGNOSIS — E039 Hypothyroidism, unspecified: Secondary | ICD-10-CM | POA: Diagnosis not present

## 2021-05-27 DIAGNOSIS — M545 Low back pain, unspecified: Secondary | ICD-10-CM | POA: Diagnosis not present

## 2021-05-27 DIAGNOSIS — E785 Hyperlipidemia, unspecified: Secondary | ICD-10-CM | POA: Diagnosis not present

## 2021-05-27 DIAGNOSIS — M170 Bilateral primary osteoarthritis of knee: Secondary | ICD-10-CM | POA: Diagnosis not present

## 2021-05-27 DIAGNOSIS — I119 Hypertensive heart disease without heart failure: Secondary | ICD-10-CM | POA: Diagnosis not present

## 2021-05-30 DIAGNOSIS — M545 Low back pain, unspecified: Secondary | ICD-10-CM | POA: Diagnosis not present

## 2021-05-30 DIAGNOSIS — E785 Hyperlipidemia, unspecified: Secondary | ICD-10-CM | POA: Diagnosis not present

## 2021-05-30 DIAGNOSIS — E039 Hypothyroidism, unspecified: Secondary | ICD-10-CM | POA: Diagnosis not present

## 2021-05-30 DIAGNOSIS — I119 Hypertensive heart disease without heart failure: Secondary | ICD-10-CM | POA: Diagnosis not present

## 2021-05-30 DIAGNOSIS — M25511 Pain in right shoulder: Secondary | ICD-10-CM | POA: Diagnosis not present

## 2021-05-30 DIAGNOSIS — M170 Bilateral primary osteoarthritis of knee: Secondary | ICD-10-CM | POA: Diagnosis not present

## 2021-06-01 ENCOUNTER — Ambulatory Visit: Payer: Medicare Other | Admitting: Vascular Surgery

## 2021-06-02 DIAGNOSIS — I119 Hypertensive heart disease without heart failure: Secondary | ICD-10-CM | POA: Diagnosis not present

## 2021-06-02 DIAGNOSIS — M545 Low back pain, unspecified: Secondary | ICD-10-CM | POA: Diagnosis not present

## 2021-06-02 DIAGNOSIS — E039 Hypothyroidism, unspecified: Secondary | ICD-10-CM | POA: Diagnosis not present

## 2021-06-02 DIAGNOSIS — E785 Hyperlipidemia, unspecified: Secondary | ICD-10-CM | POA: Diagnosis not present

## 2021-06-02 DIAGNOSIS — M25511 Pain in right shoulder: Secondary | ICD-10-CM | POA: Diagnosis not present

## 2021-06-02 DIAGNOSIS — M170 Bilateral primary osteoarthritis of knee: Secondary | ICD-10-CM | POA: Diagnosis not present

## 2021-06-03 IMAGING — US BILATERAL CAROTID DUPLEX ULTRASOUND
1 series · 13 of 24 positions shown · non-contrast
Comparison: None.

CLINICAL DATA: 72-year-old male with mild carotid artery disease
bilaterally.

EXAM:
BILATERAL CAROTID DUPLEX ULTRASOUND
TECHNIQUE: Gray scale imaging, color Doppler and duplex ultrasound were
performed of bilateral carotid and vertebral arteries in the neck.

[Series 1: bilateral carotid duplex ultrasound · 0.06mm/px · 13 of 45 slices shown]
[im 1/45]
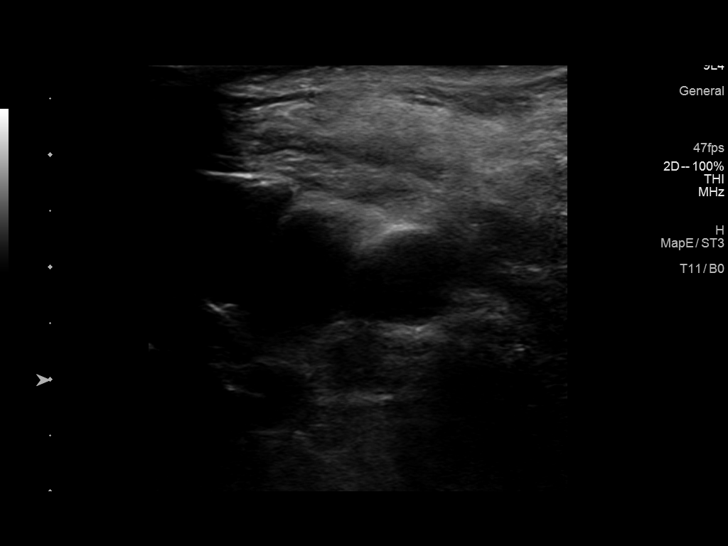
[im 4/45]
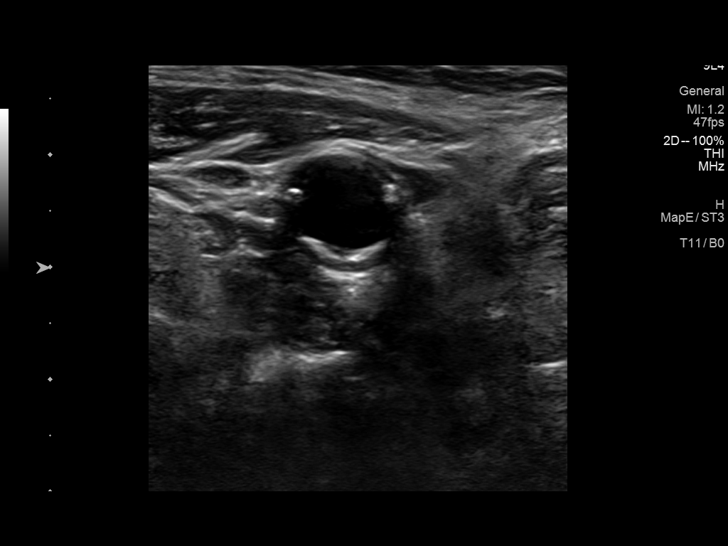
[im 8/45]
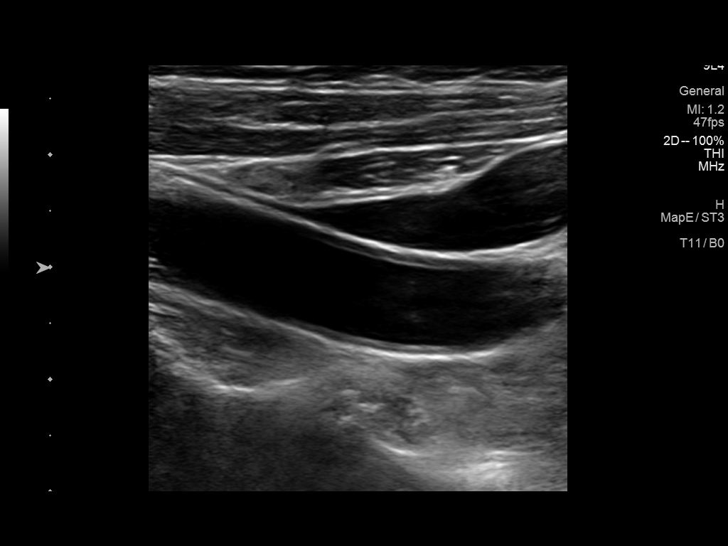
[im 12/45]
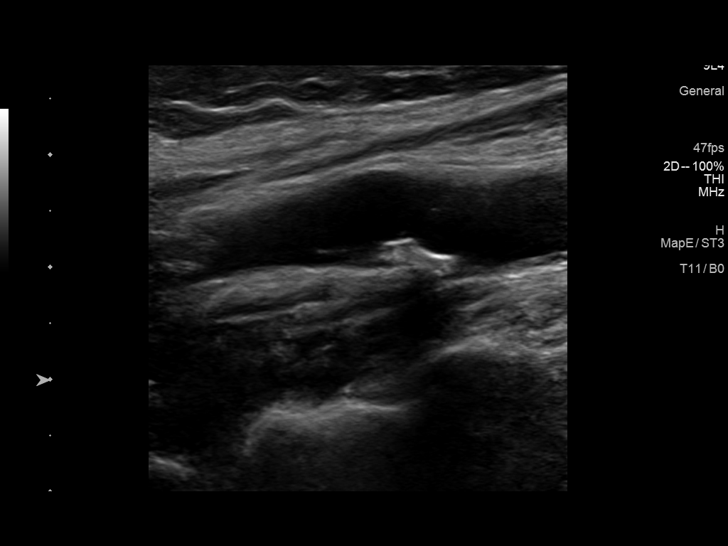
[im 16/45]
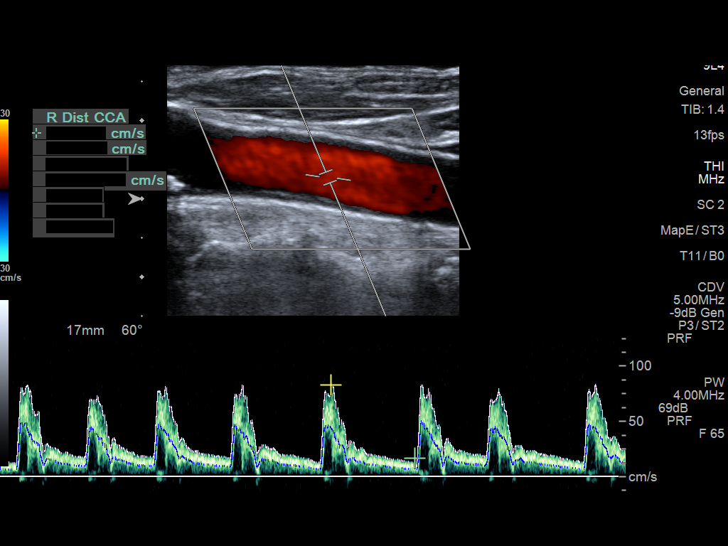
[im 20/45]
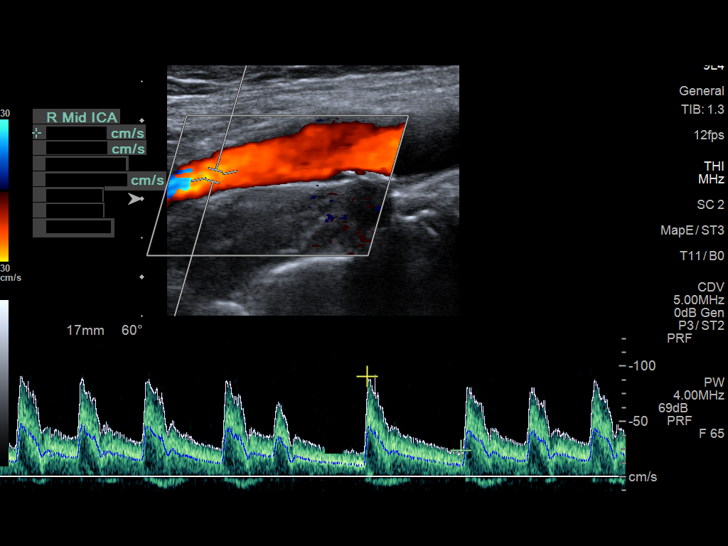
[im 23/45]
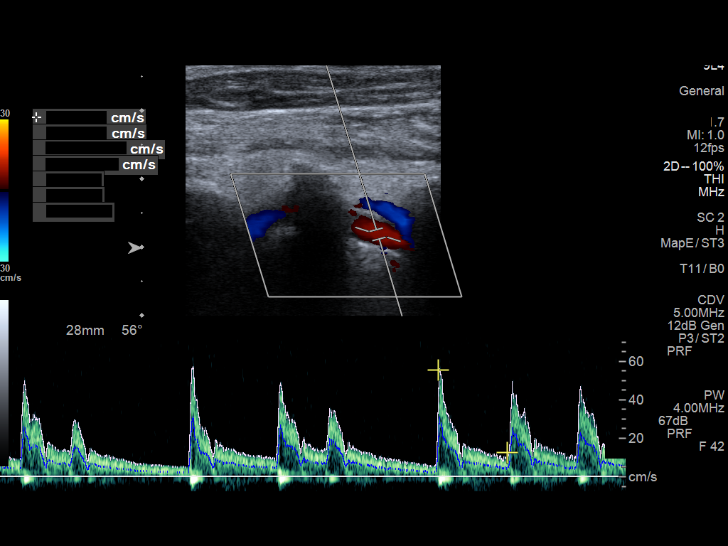
[im 25/45]
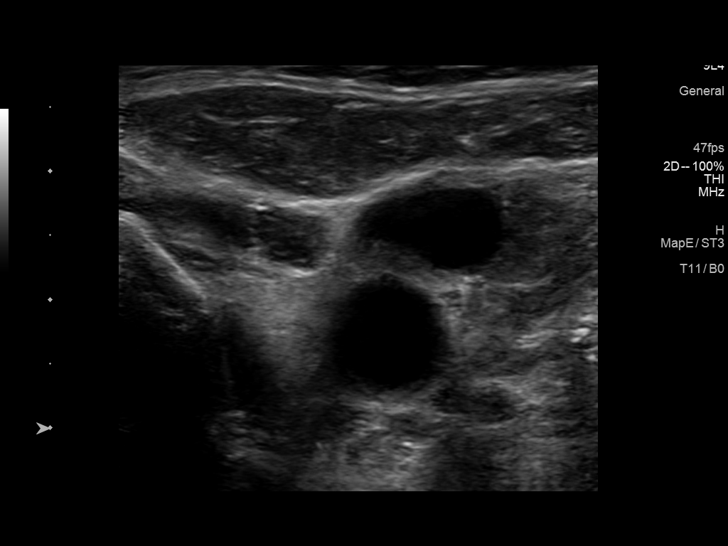
[im 29/45]
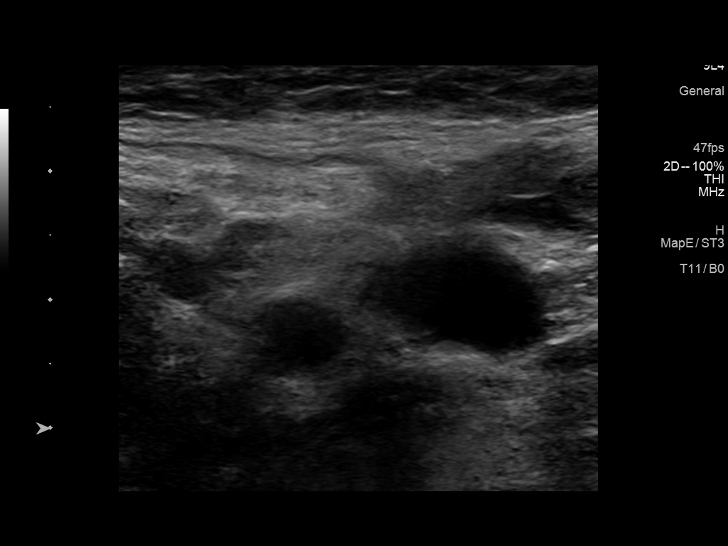
[im 33/45]
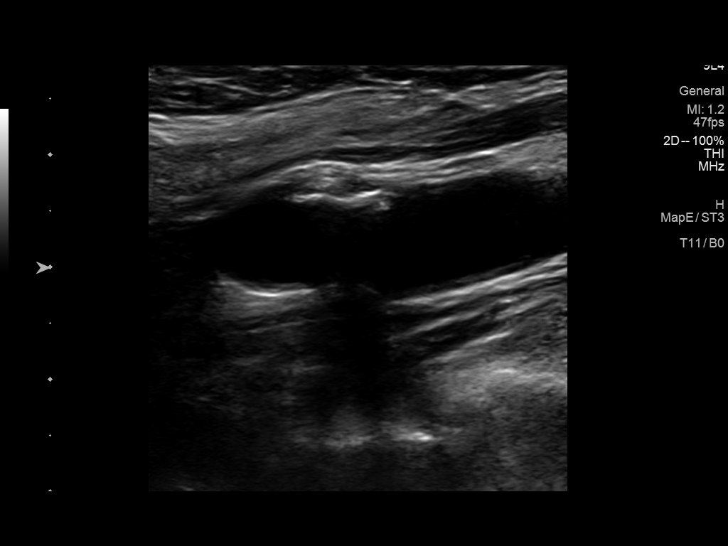
[im 37/45]
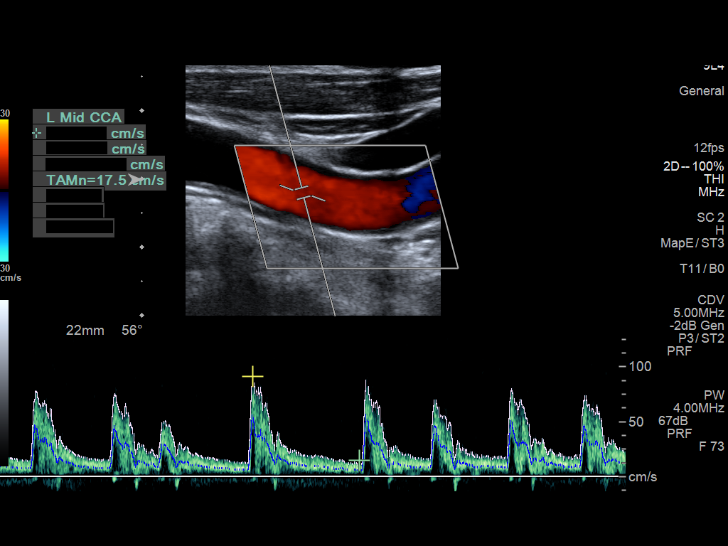
[im 41/45]
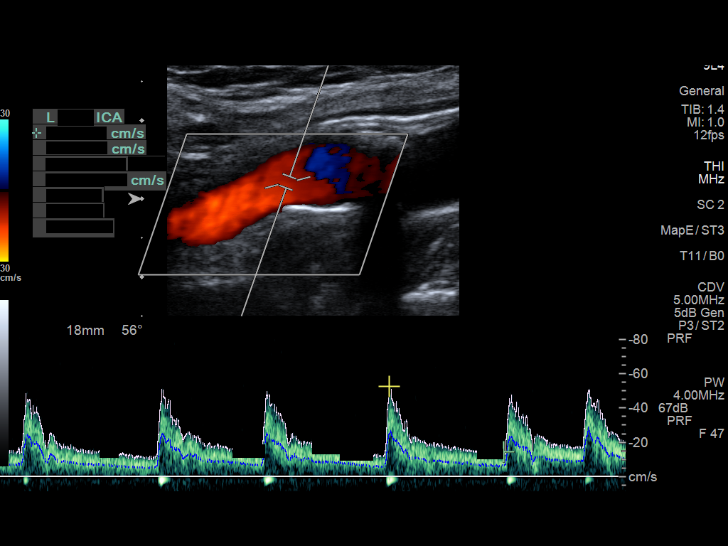
[im 45/45]
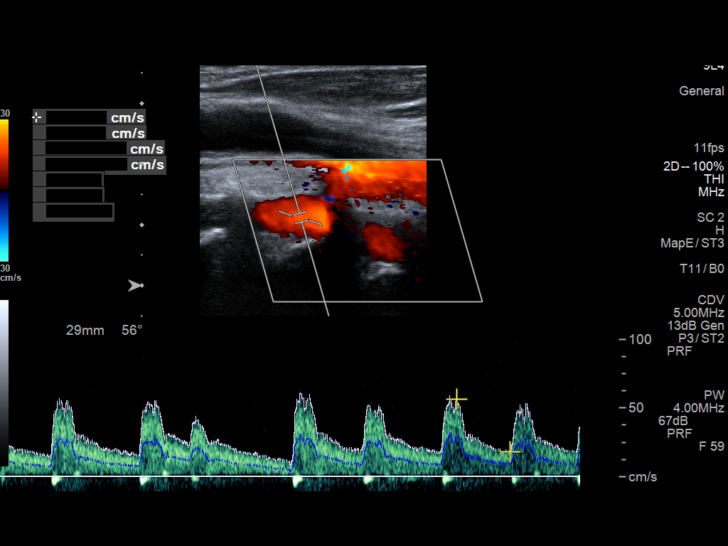

[13 of 24 positions shown; findings below may reference images not displayed]

FINDINGS: Criteria: Quantification of carotid stenosis is based on velocity
parameters that correlate the residual internal carotid diameter
with NASCET-based stenosis levels, using the diameter of the distal
internal carotid lumen as the denominator for stenosis measurement.

The following velocity measurements were obtained:

RIGHT
ICA: 103/18 cm/sec
CCA: 93/14 cm/sec

SYSTOLIC ICA/CCA RATIO:

ECA:  109 cm/sec

LEFT

ICA: 79/22 cm/sec

CCA: 108/19 cm/sec

SYSTOLIC ICA/CCA RATIO:

ECA:  75 cm/sec

RIGHT CAROTID ARTERY: Trace heterogeneous atherosclerotic plaque in
the proximal internal carotid artery. By peak systolic velocity
criteria, the estimated stenosis remains less than 50%.

RIGHT VERTEBRAL ARTERY:  Patent with normal antegrade flow.

LEFT CAROTID ARTERY: Trace heterogeneous atherosclerotic plaque in
the proximal internal carotid artery. By peak systolic velocity
criteria, the estimated stenosis remains less than 50%.

LEFT VERTEBRAL ARTERY:  Patent with normal antegrade flow.
IMPRESSION: 1. Mild (1-49%) stenosis proximal right internal carotid artery
secondary to trace heterogeneous atherosclerotic plaque.
2. Mild (1-49%) stenosis proximal left internal carotid artery
secondary to trace heterogeneous atherosclerotic plaque.
3. The vertebral arteries are patent with normal antegrade flow.

## 2021-06-06 DIAGNOSIS — M25511 Pain in right shoulder: Secondary | ICD-10-CM | POA: Diagnosis not present

## 2021-06-06 DIAGNOSIS — E039 Hypothyroidism, unspecified: Secondary | ICD-10-CM | POA: Diagnosis not present

## 2021-06-06 DIAGNOSIS — M170 Bilateral primary osteoarthritis of knee: Secondary | ICD-10-CM | POA: Diagnosis not present

## 2021-06-06 DIAGNOSIS — I119 Hypertensive heart disease without heart failure: Secondary | ICD-10-CM | POA: Diagnosis not present

## 2021-06-06 DIAGNOSIS — M545 Low back pain, unspecified: Secondary | ICD-10-CM | POA: Diagnosis not present

## 2021-06-06 DIAGNOSIS — E785 Hyperlipidemia, unspecified: Secondary | ICD-10-CM | POA: Diagnosis not present

## 2021-06-07 DIAGNOSIS — E785 Hyperlipidemia, unspecified: Secondary | ICD-10-CM | POA: Diagnosis not present

## 2021-06-07 DIAGNOSIS — E039 Hypothyroidism, unspecified: Secondary | ICD-10-CM | POA: Diagnosis not present

## 2021-06-07 DIAGNOSIS — M170 Bilateral primary osteoarthritis of knee: Secondary | ICD-10-CM | POA: Diagnosis not present

## 2021-06-07 DIAGNOSIS — I119 Hypertensive heart disease without heart failure: Secondary | ICD-10-CM | POA: Diagnosis not present

## 2021-06-07 DIAGNOSIS — M25511 Pain in right shoulder: Secondary | ICD-10-CM | POA: Diagnosis not present

## 2021-06-07 DIAGNOSIS — M545 Low back pain, unspecified: Secondary | ICD-10-CM | POA: Diagnosis not present

## 2021-06-08 DIAGNOSIS — Z9089 Acquired absence of other organs: Secondary | ICD-10-CM | POA: Diagnosis not present

## 2021-06-08 DIAGNOSIS — Z8673 Personal history of transient ischemic attack (TIA), and cerebral infarction without residual deficits: Secondary | ICD-10-CM | POA: Diagnosis not present

## 2021-06-08 DIAGNOSIS — I890 Lymphedema, not elsewhere classified: Secondary | ICD-10-CM | POA: Diagnosis not present

## 2021-06-08 DIAGNOSIS — I872 Venous insufficiency (chronic) (peripheral): Secondary | ICD-10-CM | POA: Diagnosis not present

## 2021-06-08 DIAGNOSIS — Z86718 Personal history of other venous thrombosis and embolism: Secondary | ICD-10-CM | POA: Diagnosis not present

## 2021-06-08 DIAGNOSIS — G43909 Migraine, unspecified, not intractable, without status migrainosus: Secondary | ICD-10-CM | POA: Diagnosis not present

## 2021-06-08 DIAGNOSIS — Z9181 History of falling: Secondary | ICD-10-CM | POA: Diagnosis not present

## 2021-06-08 DIAGNOSIS — G8929 Other chronic pain: Secondary | ICD-10-CM | POA: Diagnosis not present

## 2021-06-08 DIAGNOSIS — M545 Low back pain, unspecified: Secondary | ICD-10-CM | POA: Diagnosis not present

## 2021-06-08 DIAGNOSIS — N400 Enlarged prostate without lower urinary tract symptoms: Secondary | ICD-10-CM | POA: Diagnosis not present

## 2021-06-08 DIAGNOSIS — E039 Hypothyroidism, unspecified: Secondary | ICD-10-CM | POA: Diagnosis not present

## 2021-06-08 DIAGNOSIS — E785 Hyperlipidemia, unspecified: Secondary | ICD-10-CM | POA: Diagnosis not present

## 2021-06-08 DIAGNOSIS — M25512 Pain in left shoulder: Secondary | ICD-10-CM | POA: Diagnosis not present

## 2021-06-08 DIAGNOSIS — Z96641 Presence of right artificial hip joint: Secondary | ICD-10-CM | POA: Diagnosis not present

## 2021-06-08 DIAGNOSIS — I119 Hypertensive heart disease without heart failure: Secondary | ICD-10-CM | POA: Diagnosis not present

## 2021-06-08 DIAGNOSIS — M170 Bilateral primary osteoarthritis of knee: Secondary | ICD-10-CM | POA: Diagnosis not present

## 2021-06-08 DIAGNOSIS — M25511 Pain in right shoulder: Secondary | ICD-10-CM | POA: Diagnosis not present

## 2021-06-08 DIAGNOSIS — Z87891 Personal history of nicotine dependence: Secondary | ICD-10-CM | POA: Diagnosis not present

## 2021-06-09 DIAGNOSIS — R202 Paresthesia of skin: Secondary | ICD-10-CM | POA: Diagnosis not present

## 2021-06-09 DIAGNOSIS — I872 Venous insufficiency (chronic) (peripheral): Secondary | ICD-10-CM | POA: Diagnosis not present

## 2021-06-09 DIAGNOSIS — I10 Essential (primary) hypertension: Secondary | ICD-10-CM | POA: Diagnosis not present

## 2021-06-09 DIAGNOSIS — E785 Hyperlipidemia, unspecified: Secondary | ICD-10-CM | POA: Diagnosis not present

## 2021-06-09 DIAGNOSIS — M1990 Unspecified osteoarthritis, unspecified site: Secondary | ICD-10-CM | POA: Diagnosis not present

## 2021-06-09 DIAGNOSIS — D692 Other nonthrombocytopenic purpura: Secondary | ICD-10-CM | POA: Diagnosis not present

## 2021-06-09 DIAGNOSIS — Z1331 Encounter for screening for depression: Secondary | ICD-10-CM | POA: Diagnosis not present

## 2021-06-09 DIAGNOSIS — I6529 Occlusion and stenosis of unspecified carotid artery: Secondary | ICD-10-CM | POA: Diagnosis not present

## 2021-06-09 DIAGNOSIS — G4733 Obstructive sleep apnea (adult) (pediatric): Secondary | ICD-10-CM | POA: Diagnosis not present

## 2021-06-09 DIAGNOSIS — Z1389 Encounter for screening for other disorder: Secondary | ICD-10-CM | POA: Diagnosis not present

## 2021-06-09 DIAGNOSIS — R600 Localized edema: Secondary | ICD-10-CM | POA: Diagnosis not present

## 2021-06-09 DIAGNOSIS — Z0000 Encounter for general adult medical examination without abnormal findings: Secondary | ICD-10-CM | POA: Diagnosis not present

## 2021-06-09 DIAGNOSIS — E039 Hypothyroidism, unspecified: Secondary | ICD-10-CM | POA: Diagnosis not present

## 2021-06-10 DIAGNOSIS — E785 Hyperlipidemia, unspecified: Secondary | ICD-10-CM | POA: Diagnosis not present

## 2021-06-10 DIAGNOSIS — M170 Bilateral primary osteoarthritis of knee: Secondary | ICD-10-CM | POA: Diagnosis not present

## 2021-06-10 DIAGNOSIS — M25511 Pain in right shoulder: Secondary | ICD-10-CM | POA: Diagnosis not present

## 2021-06-10 DIAGNOSIS — I119 Hypertensive heart disease without heart failure: Secondary | ICD-10-CM | POA: Diagnosis not present

## 2021-06-10 DIAGNOSIS — M545 Low back pain, unspecified: Secondary | ICD-10-CM | POA: Diagnosis not present

## 2021-06-10 DIAGNOSIS — E039 Hypothyroidism, unspecified: Secondary | ICD-10-CM | POA: Diagnosis not present

## 2021-06-13 DIAGNOSIS — M170 Bilateral primary osteoarthritis of knee: Secondary | ICD-10-CM | POA: Diagnosis not present

## 2021-06-13 DIAGNOSIS — M25511 Pain in right shoulder: Secondary | ICD-10-CM | POA: Diagnosis not present

## 2021-06-13 DIAGNOSIS — I119 Hypertensive heart disease without heart failure: Secondary | ICD-10-CM | POA: Diagnosis not present

## 2021-06-13 DIAGNOSIS — E039 Hypothyroidism, unspecified: Secondary | ICD-10-CM | POA: Diagnosis not present

## 2021-06-13 DIAGNOSIS — M545 Low back pain, unspecified: Secondary | ICD-10-CM | POA: Diagnosis not present

## 2021-06-13 DIAGNOSIS — E785 Hyperlipidemia, unspecified: Secondary | ICD-10-CM | POA: Diagnosis not present

## 2021-06-14 DIAGNOSIS — E785 Hyperlipidemia, unspecified: Secondary | ICD-10-CM | POA: Diagnosis not present

## 2021-06-14 DIAGNOSIS — M545 Low back pain, unspecified: Secondary | ICD-10-CM | POA: Diagnosis not present

## 2021-06-14 DIAGNOSIS — M170 Bilateral primary osteoarthritis of knee: Secondary | ICD-10-CM | POA: Diagnosis not present

## 2021-06-14 DIAGNOSIS — I119 Hypertensive heart disease without heart failure: Secondary | ICD-10-CM | POA: Diagnosis not present

## 2021-06-14 DIAGNOSIS — E039 Hypothyroidism, unspecified: Secondary | ICD-10-CM | POA: Diagnosis not present

## 2021-06-14 DIAGNOSIS — M25511 Pain in right shoulder: Secondary | ICD-10-CM | POA: Diagnosis not present

## 2021-06-20 ENCOUNTER — Telehealth: Payer: Self-pay

## 2021-06-20 NOTE — Telephone Encounter (Signed)
Patient called and stated he changed his antibiotic regimen to taking 1 pill every third day. Began this 2-3 days ago, due to ongoing yeast infection. States he feels this is "working to dry out my rear end".   Patient wants to know if it is ok for him to continue this regimen. Wants to know if he needs to be seen in person (currently scheduled on 5/24), or if appointment could be cancelled or changed to a virtual visit.  Counseled patient to take his medications as prescribed by the provider.  Will route to provider and clinical pharmacists.  Binnie Kand, RN

## 2021-06-21 ENCOUNTER — Ambulatory Visit: Payer: Medicare Other | Admitting: Infectious Disease

## 2021-06-21 DIAGNOSIS — E785 Hyperlipidemia, unspecified: Secondary | ICD-10-CM | POA: Diagnosis not present

## 2021-06-21 DIAGNOSIS — E039 Hypothyroidism, unspecified: Secondary | ICD-10-CM | POA: Diagnosis not present

## 2021-06-21 DIAGNOSIS — I119 Hypertensive heart disease without heart failure: Secondary | ICD-10-CM | POA: Diagnosis not present

## 2021-06-21 DIAGNOSIS — M25511 Pain in right shoulder: Secondary | ICD-10-CM | POA: Diagnosis not present

## 2021-06-21 DIAGNOSIS — M170 Bilateral primary osteoarthritis of knee: Secondary | ICD-10-CM | POA: Diagnosis not present

## 2021-06-21 DIAGNOSIS — M545 Low back pain, unspecified: Secondary | ICD-10-CM | POA: Diagnosis not present

## 2021-06-21 NOTE — Telephone Encounter (Signed)
Called patient, no answer. Left message stating that tomorrow's visit will be changed to a video visit and to please call with any questions.   Beryle Flock, RN

## 2021-06-22 ENCOUNTER — Other Ambulatory Visit: Payer: Self-pay

## 2021-06-22 ENCOUNTER — Telehealth (INDEPENDENT_AMBULATORY_CARE_PROVIDER_SITE_OTHER): Payer: Medicare Other | Admitting: Infectious Disease

## 2021-06-22 DIAGNOSIS — L304 Erythema intertrigo: Secondary | ICD-10-CM | POA: Diagnosis not present

## 2021-06-22 DIAGNOSIS — L03119 Cellulitis of unspecified part of limb: Secondary | ICD-10-CM | POA: Diagnosis not present

## 2021-06-22 DIAGNOSIS — L02619 Cutaneous abscess of unspecified foot: Secondary | ICD-10-CM | POA: Diagnosis not present

## 2021-06-22 DIAGNOSIS — E785 Hyperlipidemia, unspecified: Secondary | ICD-10-CM

## 2021-06-22 MED ORDER — FLUCONAZOLE 100 MG PO TABS: 100.0000 mg | ORAL_TABLET | Freq: Every day | ORAL | 2 refills | Status: DC

## 2021-06-22 NOTE — Progress Notes (Signed)
Virtual Visit via Video Note  I connected with Casey Parker on 06/22/21 at  2:45 PM EDT by a video enabled telemedicine application and verified that I am speaking with the correct person using two identifiers.  Location: Patient: Home Provider: [facility]   I discussed the limitations of evaluation and management by telemedicine and the availability of in person appointments. The patient expressed understanding and agreed to proceed.  History of Present Illness:   Casey Parker is a very pleasant 76 year old Caucasian man with a history of DVT 20 years ago diastolic dysfunction morbid obesity osteoarthritis hypertension left ventricular hypertrophy who has venous stasis changes and apparently recurrent cellulitis.   It was my impression when he was first referred to me that he had been on levofloxacin for nearly 25 years that was prescribed by provider from [facility].   He is also more recently been also been followed by Casey Parker with Podiatry and had debridement of ulcers periodically.  Has been evaluated by vascular surgery as well and has upcoming appointments with them.  He was referred to our clinic to help with management of his recurrent cellulitis and also due to Casey Parker concerns about the risks of fluoroquinolones in a patient of his age which I agreed with.  I did not want to continue fluoroquinolones due to risk for C. difficile colitis tendinitis and instability of blood sugars encephalopathy.    I switched him over to cefadroxil 2 tablets twice daily.    In the interim he developed intertrigo and then fluconazole for 4 days.  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   He had been taking bactrim DS once daily but then at times would develop cellulitis and go up to twice daily and would eventually see resolution in his symptoms  We discussed going back to Bactrim versus instead having  cefadroxil around as a reactive antibiotic.  He wanted to try a lower dose of cefadroxil which she is done.  In the interval we gave him fluconazole for 2 weeks while holding his statin and continue topical nystatin.  He had resolution of the intertrigo in the front but still has a fair amount of fungal infection on his buttocks.  He is still suffering from this and is wondering what he should do.    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   Procedure: RIGHT TOTAL HIP ARTHROPLASTY ANTERIOR APPROACH;  Surgeon: Mcarthur Rossetti, MD;  Location: [facility];  Service: Orthopedics;  Laterality: Right;    Family History  Problem Relation Age of Onset   Hypertension Father       Social  History   Socioeconomic History   Marital status: Married    Spouse name: Not on file   Number of children: Not on file   Years of education: Not on file   Highest education level: Not on file  Occupational History   Not on file  Tobacco Use   Smoking status: Former    Types: Cigarettes    Quit date: 01/31/1988    Years since quitting: 33.4   Smokeless tobacco: Never  Substance and Sexual Activity   Alcohol use: Yes    Comment: on social occassions   Drug use: No   Sexual activity: Not on file  Other Topics Concern   Not on file  Social History Narrative   Not on file   Social Determinants of Health   Financial  Resource Strain: Not on file  Food Insecurity: Not on file  Transportation Needs: Not on file  Physical Activity: Not on file  Stress: Not on file  Social Connections: Not on file    Allergies  Allergen Reactions   Augmentin [Amoxicillin-Pot Clavulanate]    Other     bovine products -itching   Penicillins     Pt unsure if allergic to penicillin     Current Outpatient Medications:    aspirin 325 MG tablet, Take 1 tablet (325 mg total) by mouth 2 (two) times daily after a meal., Disp: 30 tablet, Rfl: 0   cefadroxil (DURICEF) 500 MG capsule, Take 1 capsule (500 mg total) by mouth 2 (two) times daily., Disp: 60 capsule, Rfl: 11   fluconazole (DIFLUCAN) 100 MG tablet, Take 1 tablet (100 mg total) by mouth daily., Disp: 14 tablet, Rfl: 2   levothyroxine (SYNTHROID, LEVOTHROID) 125 MCG tablet, Take 125 mcg by mouth daily., Disp: , Rfl:    losartan (COZAAR) 100 MG tablet, Take 100 mg by mouth daily. (Patient not taking: Reported on 05/10/2021), Disp: , Rfl:    methocarbamol (ROBAXIN) 500 MG tablet, Take 1 tablet (500 mg total) by mouth every 6 (six) hours as needed for muscle spasms., Disp: 60 tablet, Rfl: 0   Multiple Vitamin (MULTIVITAMIN) tablet, Take 1 tablet by mouth daily., Disp: , Rfl:    nystatin cream (MYCOSTATIN), Apply 1 application. topically 2 (two) times daily., Disp: 30 g, Rfl: 0   Omega-3 Fatty Acids (FISH OIL PO), Take 1,600 Units by mouth daily., Disp: , Rfl:    oxyCODONE-acetaminophen (ROXICET) 5-325 MG per tablet, Take 1-2 tablets by mouth every 4 (four) hours as needed., Disp: 60 tablet, Rfl: 0   psyllium (METAMUCIL) 58.6 % powder, Take 1 packet by mouth daily., Disp: , Rfl:    silver sulfADIAZINE (SILVADENE) 1 % cream, Apply 1 application topically daily., Disp: 400 g, Rfl: 1   simvastatin (ZOCOR) 20 MG tablet, Take 20 mg by mouth daily. , Disp: , Rfl:    tamsulosin (FLOMAX) 0.4 MG CAPS capsule, Take 0.4 mg by mouth., Disp: , Rfl:     Observations/Objective:  Ray  was in no acute distress on video feed Assessment and Plan:  Intertrigo: I like him to stop antibacterial antibiotics altogether and take a vacation some of the cefadroxil.  I am calling in a new prescription for fluconazole for 14 days and like to have him hold his statin in the interim.  Recurrent cellulitis: I will I think a new strategy of him having cefadroxil around and using it the first sign of cellulitis but not being on continuous antimicrobial therapy.  Hyperlipidemia we will hold his statin  for now while he is on the fluconazole  Follow Up Instructions:    I discussed the assessment and treatment plan with the patient. The patient was provided an opportunity to ask questions and all were answered. The patient agreed with the plan and demonstrated an understanding of the instructions.   The patient was advised to call back or seek an in-person evaluation if the symptoms worsen or if the condition fails to improve as anticipated.    Alcide Evener, MD

## 2021-06-23 DIAGNOSIS — E039 Hypothyroidism, unspecified: Secondary | ICD-10-CM | POA: Diagnosis not present

## 2021-06-23 DIAGNOSIS — M170 Bilateral primary osteoarthritis of knee: Secondary | ICD-10-CM | POA: Diagnosis not present

## 2021-06-23 DIAGNOSIS — I119 Hypertensive heart disease without heart failure: Secondary | ICD-10-CM | POA: Diagnosis not present

## 2021-06-23 DIAGNOSIS — M25511 Pain in right shoulder: Secondary | ICD-10-CM | POA: Diagnosis not present

## 2021-06-23 DIAGNOSIS — E785 Hyperlipidemia, unspecified: Secondary | ICD-10-CM | POA: Diagnosis not present

## 2021-06-23 DIAGNOSIS — M545 Low back pain, unspecified: Secondary | ICD-10-CM | POA: Diagnosis not present

## 2021-06-28 DIAGNOSIS — I119 Hypertensive heart disease without heart failure: Secondary | ICD-10-CM | POA: Diagnosis not present

## 2021-06-28 DIAGNOSIS — M170 Bilateral primary osteoarthritis of knee: Secondary | ICD-10-CM | POA: Diagnosis not present

## 2021-06-28 DIAGNOSIS — M25511 Pain in right shoulder: Secondary | ICD-10-CM | POA: Diagnosis not present

## 2021-06-28 DIAGNOSIS — E785 Hyperlipidemia, unspecified: Secondary | ICD-10-CM | POA: Diagnosis not present

## 2021-06-28 DIAGNOSIS — E039 Hypothyroidism, unspecified: Secondary | ICD-10-CM | POA: Diagnosis not present

## 2021-06-28 DIAGNOSIS — M545 Low back pain, unspecified: Secondary | ICD-10-CM | POA: Diagnosis not present

## 2021-06-30 DIAGNOSIS — I119 Hypertensive heart disease without heart failure: Secondary | ICD-10-CM | POA: Diagnosis not present

## 2021-06-30 DIAGNOSIS — E785 Hyperlipidemia, unspecified: Secondary | ICD-10-CM | POA: Diagnosis not present

## 2021-06-30 DIAGNOSIS — M545 Low back pain, unspecified: Secondary | ICD-10-CM | POA: Diagnosis not present

## 2021-06-30 DIAGNOSIS — M170 Bilateral primary osteoarthritis of knee: Secondary | ICD-10-CM | POA: Diagnosis not present

## 2021-06-30 DIAGNOSIS — M25511 Pain in right shoulder: Secondary | ICD-10-CM | POA: Diagnosis not present

## 2021-06-30 DIAGNOSIS — E039 Hypothyroidism, unspecified: Secondary | ICD-10-CM | POA: Diagnosis not present

## 2021-07-05 ENCOUNTER — Ambulatory Visit: Payer: Medicare Other | Admitting: Podiatry

## 2021-07-05 DIAGNOSIS — M170 Bilateral primary osteoarthritis of knee: Secondary | ICD-10-CM | POA: Diagnosis not present

## 2021-07-05 DIAGNOSIS — M25511 Pain in right shoulder: Secondary | ICD-10-CM | POA: Diagnosis not present

## 2021-07-05 DIAGNOSIS — I119 Hypertensive heart disease without heart failure: Secondary | ICD-10-CM | POA: Diagnosis not present

## 2021-07-05 DIAGNOSIS — E785 Hyperlipidemia, unspecified: Secondary | ICD-10-CM | POA: Diagnosis not present

## 2021-07-05 DIAGNOSIS — M545 Low back pain, unspecified: Secondary | ICD-10-CM | POA: Diagnosis not present

## 2021-07-05 DIAGNOSIS — E039 Hypothyroidism, unspecified: Secondary | ICD-10-CM | POA: Diagnosis not present

## 2021-07-06 DIAGNOSIS — M170 Bilateral primary osteoarthritis of knee: Secondary | ICD-10-CM | POA: Diagnosis not present

## 2021-07-06 DIAGNOSIS — E785 Hyperlipidemia, unspecified: Secondary | ICD-10-CM | POA: Diagnosis not present

## 2021-07-06 DIAGNOSIS — E039 Hypothyroidism, unspecified: Secondary | ICD-10-CM | POA: Diagnosis not present

## 2021-07-06 DIAGNOSIS — M545 Low back pain, unspecified: Secondary | ICD-10-CM | POA: Diagnosis not present

## 2021-07-06 DIAGNOSIS — I119 Hypertensive heart disease without heart failure: Secondary | ICD-10-CM | POA: Diagnosis not present

## 2021-07-06 DIAGNOSIS — M25511 Pain in right shoulder: Secondary | ICD-10-CM | POA: Diagnosis not present

## 2021-07-08 DIAGNOSIS — E039 Hypothyroidism, unspecified: Secondary | ICD-10-CM | POA: Diagnosis not present

## 2021-07-08 DIAGNOSIS — I890 Lymphedema, not elsewhere classified: Secondary | ICD-10-CM | POA: Diagnosis not present

## 2021-07-08 DIAGNOSIS — Z9181 History of falling: Secondary | ICD-10-CM | POA: Diagnosis not present

## 2021-07-08 DIAGNOSIS — Z87891 Personal history of nicotine dependence: Secondary | ICD-10-CM | POA: Diagnosis not present

## 2021-07-08 DIAGNOSIS — M545 Low back pain, unspecified: Secondary | ICD-10-CM | POA: Diagnosis not present

## 2021-07-08 DIAGNOSIS — I872 Venous insufficiency (chronic) (peripheral): Secondary | ICD-10-CM | POA: Diagnosis not present

## 2021-07-08 DIAGNOSIS — N400 Enlarged prostate without lower urinary tract symptoms: Secondary | ICD-10-CM | POA: Diagnosis not present

## 2021-07-08 DIAGNOSIS — M170 Bilateral primary osteoarthritis of knee: Secondary | ICD-10-CM | POA: Diagnosis not present

## 2021-07-08 DIAGNOSIS — M25512 Pain in left shoulder: Secondary | ICD-10-CM | POA: Diagnosis not present

## 2021-07-08 DIAGNOSIS — G43909 Migraine, unspecified, not intractable, without status migrainosus: Secondary | ICD-10-CM | POA: Diagnosis not present

## 2021-07-08 DIAGNOSIS — Z86718 Personal history of other venous thrombosis and embolism: Secondary | ICD-10-CM | POA: Diagnosis not present

## 2021-07-08 DIAGNOSIS — Z8673 Personal history of transient ischemic attack (TIA), and cerebral infarction without residual deficits: Secondary | ICD-10-CM | POA: Diagnosis not present

## 2021-07-08 DIAGNOSIS — Z9089 Acquired absence of other organs: Secondary | ICD-10-CM | POA: Diagnosis not present

## 2021-07-08 DIAGNOSIS — M25511 Pain in right shoulder: Secondary | ICD-10-CM | POA: Diagnosis not present

## 2021-07-08 DIAGNOSIS — G8929 Other chronic pain: Secondary | ICD-10-CM | POA: Diagnosis not present

## 2021-07-08 DIAGNOSIS — Z96641 Presence of right artificial hip joint: Secondary | ICD-10-CM | POA: Diagnosis not present

## 2021-07-08 DIAGNOSIS — E785 Hyperlipidemia, unspecified: Secondary | ICD-10-CM | POA: Diagnosis not present

## 2021-07-08 DIAGNOSIS — I119 Hypertensive heart disease without heart failure: Secondary | ICD-10-CM | POA: Diagnosis not present

## 2021-07-12 DIAGNOSIS — E785 Hyperlipidemia, unspecified: Secondary | ICD-10-CM | POA: Diagnosis not present

## 2021-07-12 DIAGNOSIS — M545 Low back pain, unspecified: Secondary | ICD-10-CM | POA: Diagnosis not present

## 2021-07-12 DIAGNOSIS — M170 Bilateral primary osteoarthritis of knee: Secondary | ICD-10-CM | POA: Diagnosis not present

## 2021-07-12 DIAGNOSIS — M25511 Pain in right shoulder: Secondary | ICD-10-CM | POA: Diagnosis not present

## 2021-07-12 DIAGNOSIS — E039 Hypothyroidism, unspecified: Secondary | ICD-10-CM | POA: Diagnosis not present

## 2021-07-12 DIAGNOSIS — I119 Hypertensive heart disease without heart failure: Secondary | ICD-10-CM | POA: Diagnosis not present

## 2021-07-19 DIAGNOSIS — M170 Bilateral primary osteoarthritis of knee: Secondary | ICD-10-CM | POA: Diagnosis not present

## 2021-07-19 DIAGNOSIS — M25511 Pain in right shoulder: Secondary | ICD-10-CM | POA: Diagnosis not present

## 2021-07-19 DIAGNOSIS — I119 Hypertensive heart disease without heart failure: Secondary | ICD-10-CM | POA: Diagnosis not present

## 2021-07-19 DIAGNOSIS — M545 Low back pain, unspecified: Secondary | ICD-10-CM | POA: Diagnosis not present

## 2021-07-19 DIAGNOSIS — E039 Hypothyroidism, unspecified: Secondary | ICD-10-CM | POA: Diagnosis not present

## 2021-07-19 DIAGNOSIS — E785 Hyperlipidemia, unspecified: Secondary | ICD-10-CM | POA: Diagnosis not present

## 2021-07-20 ENCOUNTER — Telehealth: Payer: Self-pay

## 2021-07-20 NOTE — Telephone Encounter (Signed)
I spoke to the patient and he is already scheduled for a follow up on 07/27/21. I advised him per Dr. Tommy Medal it does not sound like he has an UTI because he is not showing any urinary symptoms. Patient stated at this time he does not want to be seen sooner. I advised him if he starts to get worse he can call us back, go to ED or UC. Patient verbalized understanding

## 2021-07-20 NOTE — Telephone Encounter (Signed)
Patient and patient's caregiver called this morning with concerns that patient has a UTI and are requesting an antibiotic sent in. Patient complaining of only weakness and no urinary symptoms. I did let the patient know that we are not seeing him for a UTIs and this is something that will need to be handled by pcp. Patient stated they were advised by pcp to call EMS if patient is to weak to come in the office. Patient's caregiver still asked that I send you a message and stated patient is refusing to go to urgent care or ED.  Casey Parker T Brooks Sailors

## 2021-07-24 ENCOUNTER — Encounter (HOSPITAL_COMMUNITY): Payer: Self-pay

## 2021-07-24 ENCOUNTER — Observation Stay (HOSPITAL_COMMUNITY): Payer: Medicare Other

## 2021-07-24 ENCOUNTER — Encounter (HOSPITAL_COMMUNITY): Payer: Medicare Other

## 2021-07-24 ENCOUNTER — Other Ambulatory Visit: Payer: Self-pay

## 2021-07-24 ENCOUNTER — Inpatient Hospital Stay (HOSPITAL_COMMUNITY)
Admission: EM | Admit: 2021-07-24 | Discharge: 2021-07-27 | DRG: 603 | Disposition: A | Discharge status: Skilled Nursing Facility | Payer: Medicare Other | Attending: Internal Medicine | Admitting: Internal Medicine

## 2021-07-24 ENCOUNTER — Emergency Department (HOSPITAL_COMMUNITY): Payer: Medicare Other

## 2021-07-24 DIAGNOSIS — L89892 Pressure ulcer of other site, stage 2: Secondary | ICD-10-CM | POA: Diagnosis present

## 2021-07-24 DIAGNOSIS — D649 Anemia, unspecified: Secondary | ICD-10-CM

## 2021-07-24 DIAGNOSIS — R4182 Altered mental status, unspecified: Secondary | ICD-10-CM | POA: Diagnosis present

## 2021-07-24 DIAGNOSIS — I872 Venous insufficiency (chronic) (peripheral): Secondary | ICD-10-CM | POA: Diagnosis present

## 2021-07-24 DIAGNOSIS — I87332 Chronic venous hypertension (idiopathic) with ulcer and inflammation of left lower extremity: Secondary | ICD-10-CM | POA: Diagnosis present

## 2021-07-24 DIAGNOSIS — L538 Other specified erythematous conditions: Secondary | ICD-10-CM

## 2021-07-24 DIAGNOSIS — M2550 Pain in unspecified joint: Secondary | ICD-10-CM | POA: Diagnosis present

## 2021-07-24 DIAGNOSIS — Z79899 Other long term (current) drug therapy: Secondary | ICD-10-CM

## 2021-07-24 DIAGNOSIS — R531 Weakness: Secondary | ICD-10-CM

## 2021-07-24 DIAGNOSIS — E669 Obesity, unspecified: Secondary | ICD-10-CM | POA: Diagnosis present

## 2021-07-24 DIAGNOSIS — I10 Essential (primary) hypertension: Secondary | ICD-10-CM | POA: Diagnosis present

## 2021-07-24 DIAGNOSIS — L03115 Cellulitis of right lower limb: Secondary | ICD-10-CM | POA: Diagnosis present

## 2021-07-24 DIAGNOSIS — Z86718 Personal history of other venous thrombosis and embolism: Secondary | ICD-10-CM | POA: Diagnosis not present

## 2021-07-24 DIAGNOSIS — I5189 Other ill-defined heart diseases: Secondary | ICD-10-CM | POA: Diagnosis not present

## 2021-07-24 DIAGNOSIS — Z7982 Long term (current) use of aspirin: Secondary | ICD-10-CM | POA: Diagnosis not present

## 2021-07-24 DIAGNOSIS — E222 Syndrome of inappropriate secretion of antidiuretic hormone: Secondary | ICD-10-CM | POA: Diagnosis present

## 2021-07-24 DIAGNOSIS — M79605 Pain in left leg: Secondary | ICD-10-CM | POA: Diagnosis not present

## 2021-07-24 DIAGNOSIS — R410 Disorientation, unspecified: Secondary | ICD-10-CM | POA: Diagnosis not present

## 2021-07-24 DIAGNOSIS — N186 End stage renal disease: Secondary | ICD-10-CM | POA: Diagnosis present

## 2021-07-24 DIAGNOSIS — L8990 Pressure ulcer of unspecified site, unspecified stage: Secondary | ICD-10-CM | POA: Insufficient documentation

## 2021-07-24 DIAGNOSIS — E785 Hyperlipidemia, unspecified: Secondary | ICD-10-CM | POA: Diagnosis not present

## 2021-07-24 DIAGNOSIS — Z8673 Personal history of transient ischemic attack (TIA), and cerebral infarction without residual deficits: Secondary | ICD-10-CM | POA: Diagnosis not present

## 2021-07-24 DIAGNOSIS — I480 Paroxysmal atrial fibrillation: Secondary | ICD-10-CM | POA: Diagnosis not present

## 2021-07-24 DIAGNOSIS — Z881 Allergy status to other antibiotic agents status: Secondary | ICD-10-CM | POA: Diagnosis not present

## 2021-07-24 DIAGNOSIS — L24A9 Irritant contact dermatitis due friction or contact with other specified body fluids: Secondary | ICD-10-CM | POA: Diagnosis present

## 2021-07-24 DIAGNOSIS — Z96641 Presence of right artificial hip joint: Secondary | ICD-10-CM | POA: Diagnosis present

## 2021-07-24 DIAGNOSIS — R7401 Elevation of levels of liver transaminase levels: Secondary | ICD-10-CM

## 2021-07-24 DIAGNOSIS — G459 Transient cerebral ischemic attack, unspecified: Secondary | ICD-10-CM | POA: Diagnosis not present

## 2021-07-24 DIAGNOSIS — L304 Erythema intertrigo: Secondary | ICD-10-CM | POA: Diagnosis present

## 2021-07-24 DIAGNOSIS — Z87891 Personal history of nicotine dependence: Secondary | ICD-10-CM

## 2021-07-24 DIAGNOSIS — J810 Acute pulmonary edema: Secondary | ICD-10-CM | POA: Diagnosis present

## 2021-07-24 DIAGNOSIS — Z79890 Hormone replacement therapy (postmenopausal): Secondary | ICD-10-CM | POA: Diagnosis not present

## 2021-07-24 DIAGNOSIS — Z888 Allergy status to other drugs, medicaments and biological substances status: Secondary | ICD-10-CM

## 2021-07-24 DIAGNOSIS — M7989 Other specified soft tissue disorders: Secondary | ICD-10-CM

## 2021-07-24 DIAGNOSIS — L0390 Cellulitis, unspecified: Secondary | ICD-10-CM | POA: Diagnosis present

## 2021-07-24 DIAGNOSIS — R1310 Dysphagia, unspecified: Secondary | ICD-10-CM | POA: Diagnosis present

## 2021-07-24 DIAGNOSIS — I4891 Unspecified atrial fibrillation: Secondary | ICD-10-CM

## 2021-07-24 DIAGNOSIS — J9601 Acute respiratory failure with hypoxia: Secondary | ICD-10-CM | POA: Diagnosis present

## 2021-07-24 DIAGNOSIS — G9341 Metabolic encephalopathy: Secondary | ICD-10-CM | POA: Diagnosis present

## 2021-07-24 DIAGNOSIS — I509 Heart failure, unspecified: Secondary | ICD-10-CM | POA: Diagnosis present

## 2021-07-24 DIAGNOSIS — L03116 Cellulitis of left lower limb: Principal | ICD-10-CM | POA: Diagnosis present

## 2021-07-24 DIAGNOSIS — M79673 Pain in unspecified foot: Secondary | ICD-10-CM | POA: Diagnosis present

## 2021-07-24 DIAGNOSIS — N1831 Chronic kidney disease, stage 3a: Secondary | ICD-10-CM | POA: Diagnosis present

## 2021-07-24 DIAGNOSIS — Z880 Allergy status to penicillin: Secondary | ICD-10-CM

## 2021-07-24 DIAGNOSIS — E039 Hypothyroidism, unspecified: Secondary | ICD-10-CM | POA: Diagnosis present

## 2021-07-24 DIAGNOSIS — I890 Lymphedema, not elsewhere classified: Secondary | ICD-10-CM | POA: Diagnosis present

## 2021-07-24 DIAGNOSIS — Z7401 Bed confinement status: Secondary | ICD-10-CM | POA: Diagnosis not present

## 2021-07-24 DIAGNOSIS — D638 Anemia in other chronic diseases classified elsewhere: Secondary | ICD-10-CM | POA: Diagnosis present

## 2021-07-24 DIAGNOSIS — R41841 Cognitive communication deficit: Secondary | ICD-10-CM | POA: Diagnosis present

## 2021-07-24 DIAGNOSIS — R609 Edema, unspecified: Secondary | ICD-10-CM | POA: Diagnosis not present

## 2021-07-24 DIAGNOSIS — R399 Unspecified symptoms and signs involving the genitourinary system: Secondary | ICD-10-CM | POA: Diagnosis not present

## 2021-07-24 DIAGNOSIS — Z6836 Body mass index (BMI) 36.0-36.9, adult: Secondary | ICD-10-CM

## 2021-07-24 DIAGNOSIS — R279 Unspecified lack of coordination: Secondary | ICD-10-CM | POA: Diagnosis present

## 2021-07-24 DIAGNOSIS — Z992 Dependence on renal dialysis: Secondary | ICD-10-CM | POA: Diagnosis not present

## 2021-07-24 DIAGNOSIS — L24A2 Irritant contact dermatitis due to fecal, urinary or dual incontinence: Secondary | ICD-10-CM | POA: Diagnosis present

## 2021-07-24 DIAGNOSIS — M6281 Muscle weakness (generalized): Secondary | ICD-10-CM | POA: Diagnosis present

## 2021-07-24 DIAGNOSIS — U071 COVID-19: Secondary | ICD-10-CM | POA: Diagnosis present

## 2021-07-24 DIAGNOSIS — Z4789 Encounter for other orthopedic aftercare: Secondary | ICD-10-CM | POA: Diagnosis not present

## 2021-07-24 DIAGNOSIS — L988 Other specified disorders of the skin and subcutaneous tissue: Secondary | ICD-10-CM | POA: Diagnosis present

## 2021-07-24 DIAGNOSIS — R2689 Other abnormalities of gait and mobility: Secondary | ICD-10-CM | POA: Diagnosis present

## 2021-07-24 DIAGNOSIS — M79604 Pain in right leg: Secondary | ICD-10-CM | POA: Diagnosis not present

## 2021-07-24 DIAGNOSIS — S82891D Other fracture of right lower leg, subsequent encounter for closed fracture with routine healing: Secondary | ICD-10-CM | POA: Diagnosis not present

## 2021-07-24 DIAGNOSIS — N189 Chronic kidney disease, unspecified: Secondary | ICD-10-CM | POA: Diagnosis present

## 2021-07-24 LAB — COMPREHENSIVE METABOLIC PANEL
ALT: 51 U/L — ABNORMAL HIGH (ref 0–44)
AST: 47 U/L — ABNORMAL HIGH (ref 15–41)
Albumin: 2.1 g/dL — ABNORMAL LOW (ref 3.5–5.0)
Alkaline Phosphatase: 183 U/L — ABNORMAL HIGH (ref 38–126)
Anion gap: 13 (ref 5–15)
BUN: 15 mg/dL (ref 8–23)
CO2: 26 mmol/L (ref 22–32)
Calcium: 9 mg/dL (ref 8.9–10.3)
Chloride: 98 mmol/L (ref 98–111)
Creatinine, Ser: 0.87 mg/dL (ref 0.61–1.24)
GFR, Estimated: >60 mL/min (ref >60)
Glucose, Bld: 129 mg/dL — ABNORMAL HIGH (ref 70–99)
Potassium: 3.6 mmol/L (ref 3.5–5.1)
Sodium: 137 mmol/L (ref 135–145)
Total Bilirubin: 0.6 mg/dL (ref 0.3–1.2)
Total Protein: 6.1 g/dL — ABNORMAL LOW (ref 6.5–8.1)

## 2021-07-24 LAB — URINALYSIS, ROUTINE W REFLEX MICROSCOPIC
Bilirubin Urine: NEGATIVE
Glucose, UA: NEGATIVE mg/dL
Ketones, ur: NEGATIVE mg/dL
Leukocytes,Ua: NEGATIVE
Nitrite: NEGATIVE
Protein, ur: 100 mg/dL — AB
RBC / HPF: >50 RBC/hpf — ABNORMAL HIGH (ref 0–5)
Specific Gravity, Urine: 1.025 (ref 1.005–1.030)
pH: 5 (ref 5.0–8.0)

## 2021-07-24 LAB — CBC WITH DIFFERENTIAL/PLATELET
Abs Immature Granulocytes: 0.12 10*3/uL — ABNORMAL HIGH (ref 0.00–0.07)
Basophils Absolute: 0 10*3/uL (ref 0.0–0.1)
Basophils Relative: 0 %
Eosinophils Absolute: 0 10*3/uL (ref 0.0–0.5)
Eosinophils Relative: 0 %
HCT: 36.4 % — ABNORMAL LOW (ref 39.0–52.0)
Hemoglobin: 11.7 g/dL — ABNORMAL LOW (ref 13.0–17.0)
Immature Granulocytes: 1 %
Lymphocytes Relative: 4 %
Lymphs Abs: 0.4 10*3/uL — ABNORMAL LOW (ref 0.7–4.0)
MCH: 29.1 pg (ref 26.0–34.0)
MCHC: 32.1 g/dL (ref 30.0–36.0)
MCV: 90.5 fL (ref 80.0–100.0)
Monocytes Absolute: 0.8 10*3/uL (ref 0.1–1.0)
Monocytes Relative: 8 %
Neutro Abs: 8.7 10*3/uL — ABNORMAL HIGH (ref 1.7–7.7)
Neutrophils Relative %: 87 %
Platelets: 320 10*3/uL (ref 150–400)
RBC: 4.02 MIL/uL — ABNORMAL LOW (ref 4.22–5.81)
RDW: 15.2 % (ref 11.5–15.5)
WBC: 10 10*3/uL (ref 4.0–10.5)
nRBC: 0 % (ref 0.0–0.2)

## 2021-07-24 MED ORDER — TAMSULOSIN HCL 0.4 MG PO CAPS
0.4000 mg | ORAL_CAPSULE | Freq: Every day | ORAL | Status: DC
  Administered 2021-07-24 – 2021-07-26 (×3): 0.4 mg via ORAL
  Filled 2021-07-24 (×3): qty 1

## 2021-07-24 MED ORDER — SODIUM CHLORIDE 0.9 % IV SOLN
2.0000 g | INTRAVENOUS | Status: DC
  Administered 2021-07-24: 2 g via INTRAVENOUS
  Filled 2021-07-24: qty 20

## 2021-07-24 MED ORDER — SENNOSIDES-DOCUSATE SODIUM 8.6-50 MG PO TABS
1.0000 | ORAL_TABLET | Freq: Every evening | ORAL | Status: DC | PRN
  Administered 2021-07-25: 1 via ORAL
  Filled 2021-07-24: qty 1

## 2021-07-24 MED ORDER — SODIUM CHLORIDE 0.9 % IV BOLUS
1000.0000 mL | Freq: Once | INTRAVENOUS | Status: AC
  Administered 2021-07-24: 1000 mL via INTRAVENOUS

## 2021-07-24 MED ORDER — ASPIRIN 81 MG PO TBEC
81.0000 mg | DELAYED_RELEASE_TABLET | Freq: Every day | ORAL | Status: DC
Start: 2021-07-25 — End: 2021-07-27
  Administered 2021-07-25 – 2021-07-27 (×3): 81 mg via ORAL
  Filled 2021-07-24 (×3): qty 1

## 2021-07-24 MED ORDER — APIXABAN 5 MG PO TABS
5.0000 mg | ORAL_TABLET | Freq: Two times a day (BID) | ORAL | Status: DC
  Administered 2021-07-24 – 2021-07-27 (×6): 5 mg via ORAL
  Filled 2021-07-24 (×6): qty 1

## 2021-07-24 MED ORDER — ROSUVASTATIN CALCIUM 5 MG PO TABS
20.0000 mg | ORAL_TABLET | Freq: Every day | ORAL | Status: DC
  Administered 2021-07-24 – 2021-07-25 (×2): 20 mg via ORAL
  Filled 2021-07-24 (×4): qty 4

## 2021-07-24 MED ORDER — ACETAMINOPHEN 650 MG RE SUPP: 650.0000 mg | Freq: Four times a day (QID) | RECTAL | Status: DC | PRN

## 2021-07-24 MED ORDER — NYSTATIN 100000 UNIT/GM EX POWD
Freq: Two times a day (BID) | CUTANEOUS | Status: DC
  Filled 2021-07-24: qty 15

## 2021-07-24 MED ORDER — SODIUM CHLORIDE 0.9% FLUSH
3.0000 mL | Freq: Two times a day (BID) | INTRAVENOUS | Status: DC
  Administered 2021-07-25 – 2021-07-27 (×6): 3 mL via INTRAVENOUS

## 2021-07-24 MED ORDER — SODIUM CHLORIDE 0.9 % IV SOLN: 250.0000 mL | INTRAVENOUS | Status: DC | PRN

## 2021-07-24 MED ORDER — ACETAMINOPHEN 325 MG PO TABS
650.0000 mg | ORAL_TABLET | Freq: Four times a day (QID) | ORAL | Status: DC | PRN
  Administered 2021-07-24 – 2021-07-27 (×3): 650 mg via ORAL
  Filled 2021-07-24 (×3): qty 2

## 2021-07-24 MED ORDER — CLOTRIMAZOLE 1 % EX CREA
TOPICAL_CREAM | Freq: Two times a day (BID) | CUTANEOUS | Status: DC
  Filled 2021-07-24: qty 15

## 2021-07-24 MED ORDER — ASPIRIN 325 MG PO TABS: 325.0000 mg | ORAL_TABLET | Freq: Two times a day (BID) | ORAL | Status: DC

## 2021-07-24 MED ORDER — LEVOTHYROXINE SODIUM 25 MCG PO TABS
125.0000 ug | ORAL_TABLET | Freq: Every day | ORAL | Status: DC
  Administered 2021-07-25 – 2021-07-27 (×3): 125 ug via ORAL
  Filled 2021-07-24 (×3): qty 1

## 2021-07-24 MED ORDER — SODIUM CHLORIDE 0.9% FLUSH
3.0000 mL | INTRAVENOUS | Status: DC | PRN
Start: 2021-07-24 — End: 2021-07-27

## 2021-07-24 MED ORDER — FUROSEMIDE 20 MG PO TABS
20.0000 mg | ORAL_TABLET | ORAL | Status: DC
  Administered 2021-07-24 – 2021-07-26 (×2): 20 mg via ORAL
  Filled 2021-07-24 (×2): qty 1

## 2021-07-24 MED ORDER — ENOXAPARIN SODIUM 40 MG/0.4ML IJ SOSY: 40.0000 mg | PREFILLED_SYRINGE | INTRAMUSCULAR | Status: DC

## 2021-07-24 MED ORDER — HYDROCODONE-ACETAMINOPHEN 5-325 MG PO TABS
1.0000 | ORAL_TABLET | ORAL | Status: DC | PRN
  Administered 2021-07-25: 2 via ORAL
  Filled 2021-07-24: qty 2

## 2021-07-24 NOTE — Assessment & Plan Note (Addendum)
76 year old male with known bilateral LE venous insufficiency and chronic lymphedema with recurrent cellulitis who follows with ID and was recently changed from daily antibiotics to PRN for cellulitis infection. He started taking his Duricef on Tuesday with worsening erythema, edema, slight pain in his legs found to have failed outpatient antibiotics for his recurrent LE cellulitis -doppler negative for DVT  -Patient started on IV Rocephin and subsequently changed to cefadroxil orally per ID recommendations.. -chronic lymphedema/venous stasis. Was supposed to go back to see vascular, for possible lymphedema pump in 03/2020 and never followed up with this  -he has no elevated WBC, fevers, or tachycardia.  -Patient seen in consultation by ID who recommended Kenalog cream twice daily for 1 to 2 weeks, then Unna boots with Kenalog placed when Unna boots were changed. -Patient assessed by PT who are recommending SNF.

## 2021-07-24 NOTE — ED Notes (Signed)
Bil pedal pulses found with doppler

## 2021-07-24 NOTE — ED Provider Notes (Signed)
[facility] EMERGENCY DEPARTMENT Provider Note   CSN: [id number] Arrival date & time: 07/24/21  9741     History  Chief Complaint  Patient presents with   Leg Pain    Casey Parker is a 76 y.o. male.  Patient presents to the hospital via EMS with complaints of worsening lower leg edema, and possibly worsening mental status.  Home health aide arrived to states that the patient has been more aggressive over the past week than normal and seems slightly weaker than normal.  Home health aide is concerned the patient may have a urinary tract infection.  Patient also has cellulitis in bilateral lower extremities treated by infectious disease but has had worsening swelling and weeping over the past week.  Patient also known to have venous stasis dermatitis and has seen vascular in the past for the same.  Denies any abdominal pain, chest pain, shortness of breath.  Chief concerns are worsening mental status with increased lower leg edema.  Past medical history significant for intertrigo, history of DVT, cellulitis, hypertension, hyperlipidemia, history of TIA and stroke, venous stasis dermatitis  HPI     Home Medications Prior to Admission medications   Medication Sig Start Date End Date Taking? Authorizing Provider  aspirin 325 MG tablet Take 1 tablet (325 mg total) by mouth 2 (two) times daily after a meal. 08/24/14   Mcarthur Rossetti, MD  cefadroxil (DURICEF) 500 MG capsule Take 1 capsule (500 mg total) by mouth 2 (two) times daily. 04/18/21   Truman Hayward, MD  fluconazole (DIFLUCAN) 100 MG tablet Take 1 tablet (100 mg total) by mouth daily. 06/22/21   Truman Hayward, MD  levothyroxine (SYNTHROID, LEVOTHROID) 125 MCG tablet Take 125 mcg by mouth daily.    [provider]  losartan (COZAAR) 100 MG tablet Take 100 mg by mouth daily. Patient not taking: Reported on 05/10/2021    [provider]  methocarbamol (ROBAXIN) 500 MG tablet Take 1  tablet (500 mg total) by mouth every 6 (six) hours as needed for muscle spasms. 08/24/14   Mcarthur Rossetti, MD  Multiple Vitamin (MULTIVITAMIN) tablet Take 1 tablet by mouth daily.    [provider]  nystatin cream (MYCOSTATIN) Apply 1 application. topically 2 (two) times daily. 04/18/21   Truman Hayward, MD  Omega-3 Fatty Acids (FISH OIL PO) Take 1,600 Units by mouth daily.    [provider]  oxyCODONE-acetaminophen (ROXICET) 5-325 MG per tablet Take 1-2 tablets by mouth every 4 (four) hours as needed. 08/24/14   Mcarthur Rossetti, MD  psyllium (METAMUCIL) 58.6 % powder Take 1 packet by mouth daily.    [provider]  silver sulfADIAZINE (SILVADENE) 1 % cream Apply 1 application topically daily. 10/12/20   Edrick Kins, DPM  simvastatin (ZOCOR) 20 MG tablet Take 20 mg by mouth daily.     [provider]  tamsulosin (FLOMAX) 0.4 MG CAPS capsule Take 0.4 mg by mouth.    [provider]      Allergies    Augmentin [amoxicillin-pot clavulanate], Other, and Penicillins    Review of Systems   Review of Systems  Constitutional:  Negative for fever.  Respiratory:  Negative for shortness of breath.   Cardiovascular:  Positive for leg swelling. Negative for chest pain.  Gastrointestinal:  Negative for abdominal pain, diarrhea, nausea and vomiting.  Genitourinary:  Negative for dysuria.       Home health complains of darker  than usual urine  Musculoskeletal:  Positive for arthralgias (Bilateral knee pain followed by ortho).  Skin:        Cellulitis left leg, and venous stasis dermatitis right leg    Physical Exam Updated Vital Signs BP 124/71   Pulse 85   Temp 99.2 F (37.3 C) (Oral)   Resp 18   SpO2 98%  Physical Exam Vitals and nursing note reviewed.  Constitutional:      General: He is not in acute distress. HENT:     Head: Normocephalic and atraumatic.     Mouth/Throat:     Mouth: Mucous membranes are moist.  Eyes:      Conjunctiva/sclera: Conjunctivae normal.  Cardiovascular:     Rate and Rhythm: Normal rate and regular rhythm.     Pulses: Normal pulses.  Pulmonary:     Effort: Pulmonary effort is normal.     Breath sounds: Normal breath sounds.  Abdominal:     Palpations: Abdomen is soft.     Tenderness: There is no abdominal tenderness.  Musculoskeletal:        General: Normal range of motion.     Cervical back: Normal range of motion and neck supple.     Comments: Pulses located with Doppler on bilateral feet.  Strength grossly intact and equal bilaterally in lower extremities  Skin:    Comments: Significant cellulitis noted to left lower extremity with swelling and bullae. Right foot with discoloration, cool to touch  Neurological:     Mental Status: He is alert. Mental status is at baseline.        ED Results / Procedures / Treatments   Labs (all labs ordered are listed, but only abnormal results are displayed) Labs Reviewed  COMPREHENSIVE METABOLIC PANEL - Abnormal; Notable for the following components:      Result Value   Glucose, Bld 129 (*)    Total Protein 6.1 (*)    Albumin 2.1 (*)    AST 47 (*)    ALT 51 (*)    Alkaline Phosphatase 183 (*)    All other components within normal limits  CBC WITH DIFFERENTIAL/PLATELET - Abnormal; Notable for the following components:   RBC 4.02 (*)    Hemoglobin 11.7 (*)    HCT 36.4 (*)    Neutro Abs 8.7 (*)    Lymphs Abs 0.4 (*)    Abs Immature Granulocytes 0.12 (*)    All other components within normal limits  URINALYSIS, ROUTINE W REFLEX MICROSCOPIC - Abnormal; Notable for the following components:   Color, Urine AMBER (*)    APPearance HAZY (*)    Hgb urine dipstick LARGE (*)    Protein, ur 100 (*)    RBC / HPF >50 (*)    Bacteria, UA RARE (*)    All other components within normal limits  URINE CULTURE  CULTURE, BLOOD (ROUTINE X 2)  CULTURE, BLOOD (ROUTINE X 2)    EKG EKG Interpretation  Date/Time:  Sunday July 24 2021  11:06:31 EDT Ventricular Rate:  73 PR Interval:    QRS Duration: 106 QT Interval:  385 QTC Calculation: 425 R Axis:   -23 Text Interpretation: Atrial fibrillation Borderline left axis deviation Abnormal R-wave progression, late transition Abnormal lateral Q waves Confirmed by Sherwood Gambler [phone]) on 07/24/2021 11:09:21 AM  Radiology CT HEAD WO CONTRAST (5MM)  Result Date: 07/24/2021 CLINICAL DATA:  Mental status change.  Unknown cause. EXAM: CT HEAD WITHOUT CONTRAST TECHNIQUE: Contiguous axial images were obtained from the base  of the skull through the vertex without intravenous contrast. RADIATION DOSE REDUCTION: This exam was performed according to the departmental dose-optimization program which includes automated exposure control, adjustment of the mA and/or kV according to patient size and/or use of iterative reconstruction technique. COMPARISON:  CT examination dated December 19, 2008 FINDINGS: Brain: No evidence of acute infarction, hemorrhage, hydrocephalus, extra-axial collection or mass lesion/mass effect. Prominence of the ventricles and sulci secondary to moderate cerebral volume loss. Low-attenuation of the periventricular and subcortical white matter presumed chronic microvascular ischemic changes. Vascular: No hyperdense vessel or unexpected calcification. Skull: Normal. Negative for fracture or focal lesion. Sinuses/Orbits: No acute finding. Other: None. IMPRESSION: 1. No acute intracranial abnormality. 2. Moderate cerebral atrophy and chronic microvascular ischemic changes of the white matter. Electronically Signed   By: Keane Police D.O.   On: 07/24/2021 15:27    Procedures Procedures    Medications Ordered in ED Medications  sodium chloride 0.9 % bolus 1,000 mL (0 mLs Intravenous Stopped 07/24/21 1459)    ED Course/ Medical Decision Making/ A&P                           Medical Decision Making Amount and/or Complexity of Data Reviewed Labs: ordered.   The patient  presents with a chief concern of worsening aggression.  Differential includes but is not limited to urinary tract infection, dehydration, early onset dementia, and others  I reviewed the patient's history and review of charts including charts from infectious disease documenting cellulitis treatment, charts from podiatry documenting treatment for ulcerations of the right foot.  I ordered and interpreted labs.  Pertinent results include hemoglobin 11.7, glucose 129, albumin 2.1, AST 47, ALT 51, alk phos 183  EKG showing underlying rhythm of atrial fibrillation.  This appears to be new for patient.  Unlikely this is causing any current symptoms.  Urinalysis reassuring for no sign of infection.  Hemoglobin urinalysis likely due to attempted in and out cath which was apparently traumatic.  Due to the severity of the patient's cellulitis, and family's concern about weakness and change in mental status, feel that it would be wise to admit patient for IV antibiotics and observation  Patient care transferred at shift handoff to Domenic Moras PA-C.  CT scan head pending, ordered due to patient's daughter's concerns about worsening mental state and recently increasing weakness. Plan for admission pending CT result. Plan on starting IV abx, likely vanc and cefepime after blood cultures collected        Final Clinical Impression(s) / ED Diagnoses Final diagnoses:  Cellulitis of left lower extremity    Rx / DC Orders ED Discharge Orders     None         Ronny Bacon 07/24/21 1533    Sherwood Gambler, MD 07/29/21 [phone]

## 2021-07-24 NOTE — ED Triage Notes (Signed)
Pt bib [facility] from home where he has a home health nurse who decided to call today due to increased bil lower leg swelling and pain that has increased over a week. Pt arrives able to answer questions and is AOx4 but is a poor historian.  EMS vitals: 130/70, 84HR, 99% RA, 131CBG

## 2021-07-24 NOTE — ED Notes (Signed)
Attempted in and out cath on pt with assistance of Tobi Bastos, PM with no success, PA notified

## 2021-07-24 NOTE — Assessment & Plan Note (Addendum)
Hx of UTI like symptoms with dark urine, frequency, confusion UA with trauma from in and out cath, but no obvious infection Urine culture was negative.   Was on Rocephin for cellulitis and transition to cefadroxil.

## 2021-07-24 NOTE — Assessment & Plan Note (Signed)
Continue statin. 

## 2021-07-25 ENCOUNTER — Observation Stay (HOSPITAL_COMMUNITY): Payer: Medicare Other

## 2021-07-25 ENCOUNTER — Encounter (HOSPITAL_COMMUNITY): Payer: Self-pay | Admitting: Family Medicine

## 2021-07-25 DIAGNOSIS — R279 Unspecified lack of coordination: Secondary | ICD-10-CM | POA: Diagnosis present

## 2021-07-25 DIAGNOSIS — E222 Syndrome of inappropriate secretion of antidiuretic hormone: Secondary | ICD-10-CM | POA: Diagnosis present

## 2021-07-25 DIAGNOSIS — I872 Venous insufficiency (chronic) (peripheral): Secondary | ICD-10-CM | POA: Diagnosis present

## 2021-07-25 DIAGNOSIS — Z7982 Long term (current) use of aspirin: Secondary | ICD-10-CM | POA: Diagnosis not present

## 2021-07-25 DIAGNOSIS — I890 Lymphedema, not elsewhere classified: Secondary | ICD-10-CM | POA: Diagnosis present

## 2021-07-25 DIAGNOSIS — D638 Anemia in other chronic diseases classified elsewhere: Secondary | ICD-10-CM | POA: Diagnosis present

## 2021-07-25 DIAGNOSIS — I4891 Unspecified atrial fibrillation: Secondary | ICD-10-CM

## 2021-07-25 DIAGNOSIS — L89892 Pressure ulcer of other site, stage 2: Secondary | ICD-10-CM | POA: Diagnosis present

## 2021-07-25 DIAGNOSIS — R7401 Elevation of levels of liver transaminase levels: Secondary | ICD-10-CM | POA: Diagnosis present

## 2021-07-25 DIAGNOSIS — Z79899 Other long term (current) drug therapy: Secondary | ICD-10-CM | POA: Diagnosis not present

## 2021-07-25 DIAGNOSIS — L0390 Cellulitis, unspecified: Secondary | ICD-10-CM | POA: Diagnosis present

## 2021-07-25 DIAGNOSIS — I509 Heart failure, unspecified: Secondary | ICD-10-CM | POA: Diagnosis present

## 2021-07-25 DIAGNOSIS — L24A2 Irritant contact dermatitis due to fecal, urinary or dual incontinence: Secondary | ICD-10-CM | POA: Diagnosis present

## 2021-07-25 DIAGNOSIS — I87332 Chronic venous hypertension (idiopathic) with ulcer and inflammation of left lower extremity: Secondary | ICD-10-CM | POA: Diagnosis present

## 2021-07-25 DIAGNOSIS — M2550 Pain in unspecified joint: Secondary | ICD-10-CM | POA: Diagnosis present

## 2021-07-25 DIAGNOSIS — N186 End stage renal disease: Secondary | ICD-10-CM | POA: Diagnosis present

## 2021-07-25 DIAGNOSIS — Z6836 Body mass index (BMI) 36.0-36.9, adult: Secondary | ICD-10-CM | POA: Diagnosis not present

## 2021-07-25 DIAGNOSIS — N189 Chronic kidney disease, unspecified: Secondary | ICD-10-CM | POA: Diagnosis present

## 2021-07-25 DIAGNOSIS — E039 Hypothyroidism, unspecified: Secondary | ICD-10-CM

## 2021-07-25 DIAGNOSIS — R399 Unspecified symptoms and signs involving the genitourinary system: Secondary | ICD-10-CM | POA: Diagnosis not present

## 2021-07-25 DIAGNOSIS — L03115 Cellulitis of right lower limb: Secondary | ICD-10-CM | POA: Diagnosis present

## 2021-07-25 DIAGNOSIS — L03116 Cellulitis of left lower limb: Secondary | ICD-10-CM | POA: Diagnosis present

## 2021-07-25 DIAGNOSIS — G459 Transient cerebral ischemic attack, unspecified: Secondary | ICD-10-CM | POA: Diagnosis present

## 2021-07-25 DIAGNOSIS — J810 Acute pulmonary edema: Secondary | ICD-10-CM | POA: Diagnosis present

## 2021-07-25 DIAGNOSIS — N1831 Chronic kidney disease, stage 3a: Secondary | ICD-10-CM | POA: Diagnosis present

## 2021-07-25 DIAGNOSIS — M79673 Pain in unspecified foot: Secondary | ICD-10-CM | POA: Diagnosis present

## 2021-07-25 DIAGNOSIS — D649 Anemia, unspecified: Secondary | ICD-10-CM

## 2021-07-25 DIAGNOSIS — Z86718 Personal history of other venous thrombosis and embolism: Secondary | ICD-10-CM | POA: Diagnosis not present

## 2021-07-25 DIAGNOSIS — R41841 Cognitive communication deficit: Secondary | ICD-10-CM | POA: Diagnosis present

## 2021-07-25 DIAGNOSIS — E669 Obesity, unspecified: Secondary | ICD-10-CM | POA: Diagnosis present

## 2021-07-25 DIAGNOSIS — Z881 Allergy status to other antibiotic agents status: Secondary | ICD-10-CM | POA: Diagnosis not present

## 2021-07-25 DIAGNOSIS — L988 Other specified disorders of the skin and subcutaneous tissue: Secondary | ICD-10-CM | POA: Diagnosis present

## 2021-07-25 DIAGNOSIS — S82891D Other fracture of right lower leg, subsequent encounter for closed fracture with routine healing: Secondary | ICD-10-CM | POA: Diagnosis not present

## 2021-07-25 DIAGNOSIS — Z880 Allergy status to penicillin: Secondary | ICD-10-CM | POA: Diagnosis not present

## 2021-07-25 DIAGNOSIS — L304 Erythema intertrigo: Secondary | ICD-10-CM

## 2021-07-25 DIAGNOSIS — Z79890 Hormone replacement therapy (postmenopausal): Secondary | ICD-10-CM | POA: Diagnosis not present

## 2021-07-25 DIAGNOSIS — Z8673 Personal history of transient ischemic attack (TIA), and cerebral infarction without residual deficits: Secondary | ICD-10-CM | POA: Diagnosis not present

## 2021-07-25 DIAGNOSIS — R2689 Other abnormalities of gait and mobility: Secondary | ICD-10-CM | POA: Diagnosis present

## 2021-07-25 DIAGNOSIS — L8990 Pressure ulcer of unspecified site, unspecified stage: Secondary | ICD-10-CM | POA: Insufficient documentation

## 2021-07-25 DIAGNOSIS — R531 Weakness: Secondary | ICD-10-CM

## 2021-07-25 DIAGNOSIS — G9341 Metabolic encephalopathy: Secondary | ICD-10-CM | POA: Diagnosis present

## 2021-07-25 DIAGNOSIS — E785 Hyperlipidemia, unspecified: Secondary | ICD-10-CM

## 2021-07-25 DIAGNOSIS — Z4789 Encounter for other orthopedic aftercare: Secondary | ICD-10-CM | POA: Diagnosis not present

## 2021-07-25 DIAGNOSIS — Z992 Dependence on renal dialysis: Secondary | ICD-10-CM | POA: Diagnosis not present

## 2021-07-25 DIAGNOSIS — I10 Essential (primary) hypertension: Secondary | ICD-10-CM | POA: Diagnosis present

## 2021-07-25 DIAGNOSIS — U071 COVID-19: Secondary | ICD-10-CM | POA: Diagnosis present

## 2021-07-25 DIAGNOSIS — I480 Paroxysmal atrial fibrillation: Secondary | ICD-10-CM

## 2021-07-25 DIAGNOSIS — Z888 Allergy status to other drugs, medicaments and biological substances status: Secondary | ICD-10-CM | POA: Diagnosis not present

## 2021-07-25 DIAGNOSIS — M6281 Muscle weakness (generalized): Secondary | ICD-10-CM | POA: Diagnosis present

## 2021-07-25 DIAGNOSIS — J9601 Acute respiratory failure with hypoxia: Secondary | ICD-10-CM | POA: Diagnosis present

## 2021-07-25 DIAGNOSIS — I5189 Other ill-defined heart diseases: Secondary | ICD-10-CM | POA: Diagnosis not present

## 2021-07-25 DIAGNOSIS — R1310 Dysphagia, unspecified: Secondary | ICD-10-CM | POA: Diagnosis present

## 2021-07-25 DIAGNOSIS — R4182 Altered mental status, unspecified: Secondary | ICD-10-CM | POA: Diagnosis present

## 2021-07-25 DIAGNOSIS — L24A9 Irritant contact dermatitis due friction or contact with other specified body fluids: Secondary | ICD-10-CM | POA: Diagnosis present

## 2021-07-25 DIAGNOSIS — Z7401 Bed confinement status: Secondary | ICD-10-CM | POA: Diagnosis not present

## 2021-07-25 LAB — CBC
HCT: 29 % — ABNORMAL LOW (ref 39.0–52.0)
Hemoglobin: 9.1 g/dL — ABNORMAL LOW (ref 13.0–17.0)
MCH: 28.2 pg (ref 26.0–34.0)
MCHC: 31.4 g/dL (ref 30.0–36.0)
MCV: 89.8 fL (ref 80.0–100.0)
Platelets: 281 10*3/uL (ref 150–400)
RBC: 3.23 MIL/uL — ABNORMAL LOW (ref 4.22–5.81)
RDW: 15.5 % (ref 11.5–15.5)
WBC: 6.2 10*3/uL (ref 4.0–10.5)
nRBC: 0 % (ref 0.0–0.2)

## 2021-07-25 LAB — URINE CULTURE: Culture: NO GROWTH

## 2021-07-25 LAB — BASIC METABOLIC PANEL
Anion gap: 6 (ref 5–15)
BUN: 15 mg/dL (ref 8–23)
CO2: 24 mmol/L (ref 22–32)
Calcium: 7.5 mg/dL — ABNORMAL LOW (ref 8.9–10.3)
Chloride: 101 mmol/L (ref 98–111)
Creatinine, Ser: 0.72 mg/dL (ref 0.61–1.24)
GFR, Estimated: >60 mL/min (ref >60)
Glucose, Bld: 167 mg/dL — ABNORMAL HIGH (ref 70–99)
Potassium: 3.1 mmol/L — ABNORMAL LOW (ref 3.5–5.1)
Sodium: 131 mmol/L — ABNORMAL LOW (ref 135–145)

## 2021-07-25 LAB — ECHOCARDIOGRAM COMPLETE
AR max vel: 3.21 cm2
AV Area VTI: 3.11 cm2
AV Area mean vel: 3.17 cm2
AV Mean grad: 5 mmHg
AV Peak grad: 9.4 mmHg
Ao pk vel: 1.53 m/s
Area-P 1/2: 4.33 cm2
Height: 67 in
P 1/2 time: 920 msec
S' Lateral: 2.1 cm
Weight: 3735.47 oz

## 2021-07-25 LAB — MAGNESIUM: Magnesium: 2 mg/dL (ref 1.7–2.4)

## 2021-07-25 LAB — IRON AND TIBC
Iron: 24 ug/dL — ABNORMAL LOW (ref 45–182)
Saturation Ratios: 22 % (ref 17.9–39.5)
TIBC: 111 ug/dL — ABNORMAL LOW (ref 250–450)
UIBC: 87 ug/dL

## 2021-07-25 LAB — TSH: TSH: 2.036 u[IU]/mL (ref 0.350–4.500)

## 2021-07-25 LAB — FERRITIN: Ferritin: 382 ng/mL — ABNORMAL HIGH (ref 24–336)

## 2021-07-25 MED ORDER — CEFADROXIL 500 MG PO CAPS
500.0000 mg | ORAL_CAPSULE | Freq: Two times a day (BID) | ORAL | Status: DC
  Administered 2021-07-25 – 2021-07-27 (×5): 500 mg via ORAL
  Filled 2021-07-25 (×5): qty 1

## 2021-07-25 MED ORDER — TRIAMCINOLONE ACETONIDE 0.1 % EX CREA
TOPICAL_CREAM | Freq: Two times a day (BID) | CUTANEOUS | Status: DC
  Filled 2021-07-25 (×2): qty 15

## 2021-07-25 MED ORDER — GERHARDT'S BUTT CREAM
TOPICAL_CREAM | Freq: Four times a day (QID) | CUTANEOUS | Status: DC
  Filled 2021-07-25 (×2): qty 1

## 2021-07-25 MED ORDER — POTASSIUM CHLORIDE CRYS ER 20 MEQ PO TBCR
40.0000 meq | EXTENDED_RELEASE_TABLET | ORAL | Status: AC
  Administered 2021-07-25 (×2): 40 meq via ORAL
  Filled 2021-07-25 (×2): qty 2

## 2021-07-25 NOTE — NC FL2 (Addendum)
[place] LEVEL OF CARE SCREENING TOOL     IDENTIFICATION  Patient Name: Casey Parker Birthdate: 1945-08-27 Sex: male Admission Date (Current Location): 07/24/2021  [facility] and [place] Number:  Herbalist and Address:  [facility], [place] [address], [place] [address]      Provider Number: [id number]  Attending Physician Name and Address:  Eugenie Filler, MD  Relative Name and Phone Number:  [name], Swaim (Spouse)   [phone]    Current Level of Care: Hospital Recommended Level of Care: [place] Prior Approval Number:    Date Approved/Denied:   PASRR Number: [id number]  Discharge Plan: SNF    Current Diagnoses: Patient Active Problem List   Diagnosis Date Noted   Pressure injury of skin 07/25/2021   Lower urinary tract symptoms 07/24/2021   Transaminitis 07/24/2021   Normocytic anemia 07/24/2021   Paroxysmal atrial fibrillation ([place]) 07/24/2021   Intertrigo 04/18/2021   Venous stasis dermatitis 03/15/2021   Cellulitis and abscess of foot, except toes 09/07/2020   Skin maceration 09/07/2020   Chronic knee pain after total replacement of left knee joint 04/12/2020   Pain in left elbow 01/01/2020   Unilateral primary osteoarthritis, left knee 09/01/2019   Unilateral primary osteoarthritis, right knee 09/01/2019   Obesity, Class III, BMI 40-49.9 (morbid obesity) ([place]) 05/03/2016   Hypertension 05/03/2016   Hypothyroid 05/03/2016   Hyperlipidemia 05/03/2016   Osteoarthritis of right hip 08/21/2014   Status post total replacement of right hip 08/21/2014   Osteoarthritis    Migraines    Cellulitis in setting of venous stasis dermatitis and lymphedema     hx of TIA    Left ventricular hypertrophy     Orientation RESPIRATION BLADDER Height & Weight     Self, Time, Situation, Place  Normal Continent, External catheter Weight: 233 lb 7.5 oz (105.9 kg) Height:  '5\' 7"'$  (170.2 cm)   BEHAVIORAL SYMPTOMS/MOOD NEUROLOGICAL BOWEL NUTRITION STATUS      Continent Diet  AMBULATORY STATUS COMMUNICATION OF NEEDS Skin   Extensive Assist Verbally Normal                       Personal Care Assistance Level of Assistance  Bathing, Dressing, Feeding Bathing Assistance: Maximum assistance Feeding assistance: Limited assistance Dressing Assistance: Maximum assistance     Functional Limitations Info  Sight, Hearing, Speech Sight Info: Impaired Hearing Info: Adequate Speech Info: Adequate    SPECIAL CARE FACTORS FREQUENCY  PT (By licensed PT), OT (By licensed OT)     PT Frequency: 5x a week OT Frequency: 5x a week            Contractures Contractures Info: Not present    Additional Factors Info  Code Status, Allergies Code Status Info: Full Allergies Info: Augmentin (Amoxicillin-pot Clavulanate)   Other   Penicillins           Current Medications (07/25/2021):  This is the current hospital active medication list Current Facility-Administered Medications  Medication Dose Route Frequency Provider Last Rate Last Admin   0.9 %  sodium chloride infusion  250 mL Intravenous PRN Orma Flaming, MD       acetaminophen (TYLENOL) tablet 650 mg  650 mg Oral Q6H PRN Orma Flaming, MD   650 mg at 07/24/21 2255   Or   acetaminophen (TYLENOL) suppository 650 mg  650 mg Rectal Q6H PRN Orma Flaming, MD       apixaban Arne Cleveland)  tablet 5 mg  5 mg Oral BID Bertis Ruddy, RPH   5 mg at 07/25/21 8841   aspirin EC tablet 81 mg  81 mg Oral Daily Orma Flaming, MD   81 mg at 07/25/21 6606   cefadroxil (DURICEF) capsule 500 mg  500 mg Oral BID Golden Circle, FNP   500 mg at 07/25/21 1605   furosemide (LASIX) tablet 20 mg  20 mg Oral Auburn Bilberry, MD   20 mg at 07/24/21 2015   Gerhardt's butt cream   Topical QID Orma Flaming, MD   Given at 07/25/21 1606   HYDROcodone-acetaminophen (NORCO/VICODIN) 5-325 MG per tablet 1-2 tablet  1-2 tablet Oral Q4H PRN Orma Flaming, MD   2 tablet at 07/25/21 0914   levothyroxine (SYNTHROID) tablet 125 mcg  125 mcg Oral Q0600 Orma Flaming, MD   125 mcg at 07/25/21 0542   rosuvastatin (CRESTOR) tablet 20 mg  20 mg Oral Daily Orma Flaming, MD   20 mg at 07/25/21 0914   senna-docusate (Senokot-S) tablet 1 tablet  1 tablet Oral QHS PRN Orma Flaming, MD   1 tablet at 07/25/21 1226   sodium chloride flush (NS) 0.9 % injection 3 mL  3 mL Intravenous Q12H Orma Flaming, MD   3 mL at 07/25/21 0919   sodium chloride flush (NS) 0.9 % injection 3 mL  3 mL Intravenous PRN Orma Flaming, MD       tamsulosin [facility]) capsule 0.4 mg  0.4 mg Oral QPC supper Orma Flaming, MD   0.4 mg at 07/24/21 2255   triamcinolone cream (KENALOG) 0.1 % cream   Topical BID Eugenie Filler, MD         Discharge Medications: Please see discharge summary for a list of discharge medications.  Relevant Imaging Results:  Relevant Lab Results:   Additional Information SSN: [id number]  Reece Agar, [place]

## 2021-07-25 NOTE — Evaluation (Signed)
Physical Therapy Evaluation Patient Details Name: Casey Parker MRN: [id number] DOB: March 22, 1945 Today's Date: 07/25/2021  History of Present Illness  Pt is a 76 y/o male who presents to acute care physical therapy due to bilateral cellulitis in setting of venous stasis dermatitis and lyphedema. Pt has PMH of HTN, obesity, TIA, afib, and hypothyroidism.  Clinical Impression  Pt agreeable to physical therapy evaluation/treatment session. Pt requiring maximal assistance to total assistance x 2 for bed mobility and transfers. Pt currently presents with functional limitations secondary to impairments listed in PT problem list. Pt to benefit from skilled, acute care physical therapy interventions to maximize his mobility, strength, independence level, and quality of life. Pt has had major decline in his functional mobility and ability to care for himself in last several months. Pt has private nurse 7 days per week but pt is currently requiring more help than is available. Recommend SNF upon discharge.      Recommendations for follow up therapy are one component of a multi-disciplinary discharge planning process, led by the attending physician.  Recommendations may be updated based on patient status, additional functional criteria and insurance authorization.  Follow Up Recommendations Skilled nursing-short term rehab (<3 hours/day) Can patient physically be transported by private vehicle: No    Assistance Recommended at Discharge Frequent or constant Supervision/Assistance  Patient can return home with the following  Two people to help with walking and/or transfers;Two people to help with bathing/dressing/bathroom;Assist for transportation;Assistance with cooking/housework;A lot of help with bathing/dressing/bathroom;A lot of help with walking and/or transfers    Equipment Recommendations None recommended by PT (going to SNF)  Recommendations for Other Services  OT consult    Functional Status  Assessment Patient has had a recent decline in their functional status and/or demonstrates limited ability to make significant improvements in function in a reasonable and predictable amount of time     Precautions / Restrictions Precautions Precautions: Fall (wounds to bilateral LE's posteriorly including buttocks)      Mobility  Bed Mobility Overal bed mobility: Needs Assistance Bed Mobility: Supine to Sit, Sit to Supine     Supine to sit: Max assist, +2 for physical assistance, +2 for safety/equipment Sit to supine: Total assist, +2 for physical assistance, +2 for safety/equipment   General bed mobility comments: Pt screaming in pain when being mobilized. Pt required more than reasonable time to complete tasks. Pt able to sit at EOB for several minutes without UE assistance although slouched posture present.    Transfers Overall transfer level: Needs assistance Equipment used:  Antony Salmon) Transfers: Sit to/from Stand Sit to Stand: Max assist, +2 physical assistance, +2 safety/equipment           General transfer comment: Pt unable to bring hips forwards and stood with slouched posture despite cues. Pt with severe knee crepitus and hyperextension on right. Stedy unable to be adjusted for pt to obtain sitting position due to body habitus and pt's positioning. Pt brought back to sitting at EOB thereafter.    Ambulation/Gait               General Gait Details: unable  Stairs            Wheelchair Mobility    Modified Rankin (Stroke Patients Only)       Balance Overall balance assessment: Needs assistance Sitting-balance support: No upper extremity supported, Feet unsupported Sitting balance-Leahy Scale: Fair     Standing balance support: Bilateral upper extremity supported Standing balance-Leahy Scale: Poor  Pertinent Vitals/Pain Pain Assessment Pain Assessment: 0-10 Pain Score: 3  Pain Location: bilateral  LE's Pain Descriptors / Indicators: Grimacing, Moaning (severe pain with movement) Pain Intervention(s): Limited activity within patient's tolerance, Monitored during session, Repositioned    Home Living Family/patient expects to be discharged to:: Private residence Living Arrangements: Spouse/significant other Available Help at Discharge: Personal care attendant;Available 24 hours/day (pt has private nurse 7 days week) Type of Home: House Home Access: Stairs to enter Entrance Stairs-Rails: Can reach both Entrance Stairs-Number of Steps: 4   Home Layout: Two level;Able to live on main level with bedroom/bathroom (Pt does not go upstairs.) Home Equipment: Wheelchair - Forensic psychologist (2 wheels);BSC/3in1 (pt has bariatric equipment; lift chair) Pt's lift chair has not been working recently.       Prior Function Prior Level of Function : Needs assist (Has not drived in several months)       Physical Assist : Mobility (physical);ADLs (physical) Mobility (physical): Transfers;Gait;Stairs (pt sleeps in lift chair) ADLs (physical): Grooming;Bathing;Dressing;Toileting (Pt able to feed himself and use urinal but requires assistance for BM.) Mobility Comments: Pt walked with min assist short distances within house after getting up from lift chair (about 2 weeks ago). Pt has required max assist within last two weeks including walking short distance to bathroom. Pt has had 3 falls since March. Pt has been receiving HHPT once per week.       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)  Cognition  Arousal/Alertness: Awake/alert Behavior During Therapy: WFL for tasks assessed/performed Overall Cognitive Status: Within Functional Limits for tasks assessed                                          General Comments      Exercises General Exercises - Lower Extremity Long Arc Quad: Both, 10 reps, Seated Heel Slides: Both, 10 reps, Seated (attempt to bend knees while sitting to reach floor) Hip ABduction/ADduction: 10 reps, Both, Seated (pillow adduction squeezes)   Assessment/Plan    PT Assessment Patient needs continued PT services  PT Problem List Decreased strength;Decreased range of motion;Decreased activity tolerance;Decreased balance;Decreased mobility;Decreased knowledge of use of DME;Decreased knowledge of precautions;Obesity;Decreased skin integrity;Pain       PT Treatment Interventions DME instruction;Gait training;Functional mobility training;Therapeutic activities;Therapeutic exercise;Balance training;Neuromuscular re-education;Patient/family education    PT Goals (Current goals can be found in the Care Plan section)  Acute Rehab PT Goals Patient Stated Goal: Get out of hospital PT Goal Formulation: With patient Time For Goal Achievement: 08/08/21 Potential to Achieve Goals: Fair    Frequency Min 3X/week     Co-evaluation               AM-PAC PT "6 Clicks" Mobility  Outcome Measure Help needed turning from your back to your side while in a flat bed without using bedrails?: Total Help needed moving from lying on your back to sitting on the side of a flat bed without using bedrails?: Total Help needed moving to and from a bed to a chair (including a wheelchair)?: Total Help  needed standing up from a chair using your arms (e.g., wheelchair or bedside chair)?: Total Help needed to walk in hospital room?: Total Help needed climbing 3-5 steps with a railing? : Total 6 Click Score: 6    End of Session Equipment Utilized During Treatment:  Gait belt Activity Tolerance: Patient limited by fatigue;Patient limited by pain Patient left: in bed;with family/visitor present Nurse Communication: Mobility status PT Visit Diagnosis: History of falling (Z91.81);Difficulty in walking, not elsewhere classified (R26.2);Pain;Muscle weakness (generalized) (M62.81)    Time: 9528-4132 PT Time Calculation (min) (ACUTE ONLY): 38 min   Charges:   PT Evaluation $PT Eval Moderate Complexity: 1 Mod PT Treatments $Therapeutic Exercise: 8-22 mins $Therapeutic Activity: 8-22 mins        Tana Coast, PT   [employer] 07/25/2021, 4:23 PM

## 2021-07-25 NOTE — TOC Progression Note (Signed)
Transition of Care [facility]) - Progression Note    Patient Details  Name: Casey Parker MRN: [id number] Date of Birth: 1945-03-11  Transition of Care [facility]) CM/SW Contact  Ivette Loyal, [place] Phone Number: 07/25/2021, 4:37 PM  Clinical Narrative:    Family requested [facility], CSW followed up with [facility] and they do not have any male beds available at the moment. CSW will continue to follow for bed availability.    Expected Discharge Plan: Skilled Nursing Facility    Expected Discharge Plan and Services Expected Discharge Plan: Skilled Nursing Facility   Discharge Planning Services: CM Consult   Living arrangements for the past 2 months: Single Family Home                 DME Arranged: N/A DME Agency: NA       HH Arranged: NA           Social Determinants of Health (SDOH) Interventions    Readmission Risk Interventions     No data to display

## 2021-07-26 DIAGNOSIS — I5189 Other ill-defined heart diseases: Secondary | ICD-10-CM | POA: Diagnosis not present

## 2021-07-26 DIAGNOSIS — L03116 Cellulitis of left lower limb: Secondary | ICD-10-CM | POA: Diagnosis not present

## 2021-07-26 DIAGNOSIS — I10 Essential (primary) hypertension: Secondary | ICD-10-CM | POA: Diagnosis not present

## 2021-07-26 DIAGNOSIS — E785 Hyperlipidemia, unspecified: Secondary | ICD-10-CM | POA: Diagnosis not present

## 2021-07-26 LAB — BASIC METABOLIC PANEL
Anion gap: 9 (ref 5–15)
BUN: 14 mg/dL (ref 8–23)
CO2: 26 mmol/L (ref 22–32)
Calcium: 8 mg/dL — ABNORMAL LOW (ref 8.9–10.3)
Chloride: 103 mmol/L (ref 98–111)
Creatinine, Ser: 0.6 mg/dL — ABNORMAL LOW (ref 0.61–1.24)
GFR, Estimated: >60 mL/min (ref >60)
Glucose, Bld: 110 mg/dL — ABNORMAL HIGH (ref 70–99)
Potassium: 4.2 mmol/L (ref 3.5–5.1)
Sodium: 138 mmol/L (ref 135–145)

## 2021-07-26 LAB — CBC
HCT: 30 % — ABNORMAL LOW (ref 39.0–52.0)
Hemoglobin: 9.3 g/dL — ABNORMAL LOW (ref 13.0–17.0)
MCH: 28.4 pg (ref 26.0–34.0)
MCHC: 31 g/dL (ref 30.0–36.0)
MCV: 91.5 fL (ref 80.0–100.0)
Platelets: 300 10*3/uL (ref 150–400)
RBC: 3.28 MIL/uL — ABNORMAL LOW (ref 4.22–5.81)
RDW: 15.7 % — ABNORMAL HIGH (ref 11.5–15.5)
WBC: 6.5 10*3/uL (ref 4.0–10.5)
nRBC: 0 % (ref 0.0–0.2)

## 2021-07-27 ENCOUNTER — Other Ambulatory Visit (HOSPITAL_COMMUNITY): Payer: Self-pay

## 2021-07-27 ENCOUNTER — Ambulatory Visit: Payer: Medicare Other | Admitting: Infectious Disease

## 2021-07-27 DIAGNOSIS — N189 Chronic kidney disease, unspecified: Secondary | ICD-10-CM | POA: Diagnosis not present

## 2021-07-27 DIAGNOSIS — I4891 Unspecified atrial fibrillation: Secondary | ICD-10-CM | POA: Diagnosis not present

## 2021-07-27 DIAGNOSIS — L304 Erythema intertrigo: Secondary | ICD-10-CM | POA: Diagnosis not present

## 2021-07-27 DIAGNOSIS — Z8673 Personal history of transient ischemic attack (TIA), and cerebral infarction without residual deficits: Secondary | ICD-10-CM | POA: Diagnosis not present

## 2021-07-27 DIAGNOSIS — R2689 Other abnormalities of gait and mobility: Secondary | ICD-10-CM | POA: Diagnosis not present

## 2021-07-27 DIAGNOSIS — M2550 Pain in unspecified joint: Secondary | ICD-10-CM | POA: Diagnosis not present

## 2021-07-27 DIAGNOSIS — I872 Venous insufficiency (chronic) (peripheral): Secondary | ICD-10-CM | POA: Diagnosis not present

## 2021-07-27 DIAGNOSIS — I480 Paroxysmal atrial fibrillation: Secondary | ICD-10-CM | POA: Diagnosis not present

## 2021-07-27 DIAGNOSIS — N312 Flaccid neuropathic bladder, not elsewhere classified: Secondary | ICD-10-CM | POA: Diagnosis not present

## 2021-07-27 DIAGNOSIS — D6869 Other thrombophilia: Secondary | ICD-10-CM | POA: Diagnosis not present

## 2021-07-27 DIAGNOSIS — R1310 Dysphagia, unspecified: Secondary | ICD-10-CM | POA: Diagnosis not present

## 2021-07-27 DIAGNOSIS — M1990 Unspecified osteoarthritis, unspecified site: Secondary | ICD-10-CM | POA: Diagnosis not present

## 2021-07-27 DIAGNOSIS — M79672 Pain in left foot: Secondary | ICD-10-CM | POA: Diagnosis not present

## 2021-07-27 DIAGNOSIS — N400 Enlarged prostate without lower urinary tract symptoms: Secondary | ICD-10-CM | POA: Diagnosis not present

## 2021-07-27 DIAGNOSIS — L89899 Pressure ulcer of other site, unspecified stage: Secondary | ICD-10-CM | POA: Diagnosis not present

## 2021-07-27 DIAGNOSIS — E222 Syndrome of inappropriate secretion of antidiuretic hormone: Secondary | ICD-10-CM | POA: Diagnosis present

## 2021-07-27 DIAGNOSIS — I509 Heart failure, unspecified: Secondary | ICD-10-CM | POA: Diagnosis present

## 2021-07-27 DIAGNOSIS — E039 Hypothyroidism, unspecified: Secondary | ICD-10-CM | POA: Diagnosis not present

## 2021-07-27 DIAGNOSIS — L03115 Cellulitis of right lower limb: Secondary | ICD-10-CM | POA: Diagnosis not present

## 2021-07-27 DIAGNOSIS — L8990 Pressure ulcer of unspecified site, unspecified stage: Secondary | ICD-10-CM | POA: Diagnosis not present

## 2021-07-27 DIAGNOSIS — N1831 Chronic kidney disease, stage 3a: Secondary | ICD-10-CM | POA: Diagnosis not present

## 2021-07-27 DIAGNOSIS — R21 Rash and other nonspecific skin eruption: Secondary | ICD-10-CM | POA: Diagnosis not present

## 2021-07-27 DIAGNOSIS — G459 Transient cerebral ischemic attack, unspecified: Secondary | ICD-10-CM | POA: Diagnosis not present

## 2021-07-27 DIAGNOSIS — R4182 Altered mental status, unspecified: Secondary | ICD-10-CM | POA: Diagnosis present

## 2021-07-27 DIAGNOSIS — R3914 Feeling of incomplete bladder emptying: Secondary | ICD-10-CM | POA: Diagnosis not present

## 2021-07-27 DIAGNOSIS — N186 End stage renal disease: Secondary | ICD-10-CM | POA: Diagnosis not present

## 2021-07-27 DIAGNOSIS — M623 Immobility syndrome (paraplegic): Secondary | ICD-10-CM | POA: Diagnosis not present

## 2021-07-27 DIAGNOSIS — R41841 Cognitive communication deficit: Secondary | ICD-10-CM | POA: Diagnosis not present

## 2021-07-27 DIAGNOSIS — Z79899 Other long term (current) drug therapy: Secondary | ICD-10-CM | POA: Diagnosis not present

## 2021-07-27 DIAGNOSIS — M79673 Pain in unspecified foot: Secondary | ICD-10-CM | POA: Diagnosis not present

## 2021-07-27 DIAGNOSIS — M7989 Other specified soft tissue disorders: Secondary | ICD-10-CM | POA: Diagnosis not present

## 2021-07-27 DIAGNOSIS — R609 Edema, unspecified: Secondary | ICD-10-CM | POA: Diagnosis not present

## 2021-07-27 DIAGNOSIS — J810 Acute pulmonary edema: Secondary | ICD-10-CM | POA: Diagnosis present

## 2021-07-27 DIAGNOSIS — Z4789 Encounter for other orthopedic aftercare: Secondary | ICD-10-CM | POA: Diagnosis not present

## 2021-07-27 DIAGNOSIS — I10 Essential (primary) hypertension: Secondary | ICD-10-CM | POA: Diagnosis not present

## 2021-07-27 DIAGNOSIS — Z23 Encounter for immunization: Secondary | ICD-10-CM | POA: Diagnosis not present

## 2021-07-27 DIAGNOSIS — E785 Hyperlipidemia, unspecified: Secondary | ICD-10-CM | POA: Diagnosis not present

## 2021-07-27 DIAGNOSIS — Z125 Encounter for screening for malignant neoplasm of prostate: Secondary | ICD-10-CM | POA: Diagnosis not present

## 2021-07-27 DIAGNOSIS — Z992 Dependence on renal dialysis: Secondary | ICD-10-CM | POA: Diagnosis not present

## 2021-07-27 DIAGNOSIS — I87332 Chronic venous hypertension (idiopathic) with ulcer and inflammation of left lower extremity: Secondary | ICD-10-CM | POA: Diagnosis present

## 2021-07-27 DIAGNOSIS — S41112A Laceration without foreign body of left upper arm, initial encounter: Secondary | ICD-10-CM | POA: Diagnosis not present

## 2021-07-27 DIAGNOSIS — S92002A Unspecified fracture of left calcaneus, initial encounter for closed fracture: Secondary | ICD-10-CM | POA: Diagnosis not present

## 2021-07-27 DIAGNOSIS — G9341 Metabolic encephalopathy: Secondary | ICD-10-CM | POA: Diagnosis not present

## 2021-07-27 DIAGNOSIS — N401 Enlarged prostate with lower urinary tract symptoms: Secondary | ICD-10-CM | POA: Diagnosis not present

## 2021-07-27 DIAGNOSIS — E8809 Other disorders of plasma-protein metabolism, not elsewhere classified: Secondary | ICD-10-CM | POA: Diagnosis not present

## 2021-07-27 DIAGNOSIS — J9601 Acute respiratory failure with hypoxia: Secondary | ICD-10-CM | POA: Diagnosis not present

## 2021-07-27 DIAGNOSIS — L988 Other specified disorders of the skin and subcutaneous tissue: Secondary | ICD-10-CM | POA: Diagnosis present

## 2021-07-27 DIAGNOSIS — H2513 Age-related nuclear cataract, bilateral: Secondary | ICD-10-CM | POA: Diagnosis not present

## 2021-07-27 DIAGNOSIS — Z7401 Bed confinement status: Secondary | ICD-10-CM | POA: Diagnosis not present

## 2021-07-27 DIAGNOSIS — R279 Unspecified lack of coordination: Secondary | ICD-10-CM | POA: Diagnosis not present

## 2021-07-27 DIAGNOSIS — B372 Candidiasis of skin and nail: Secondary | ICD-10-CM | POA: Diagnosis not present

## 2021-07-27 DIAGNOSIS — Q752 Hypertelorism: Secondary | ICD-10-CM | POA: Diagnosis not present

## 2021-07-27 DIAGNOSIS — S82891D Other fracture of right lower leg, subsequent encounter for closed fracture with routine healing: Secondary | ICD-10-CM | POA: Diagnosis not present

## 2021-07-27 DIAGNOSIS — E559 Vitamin D deficiency, unspecified: Secondary | ICD-10-CM | POA: Diagnosis not present

## 2021-07-27 DIAGNOSIS — D649 Anemia, unspecified: Secondary | ICD-10-CM | POA: Diagnosis not present

## 2021-07-27 DIAGNOSIS — M6281 Muscle weakness (generalized): Secondary | ICD-10-CM | POA: Diagnosis not present

## 2021-07-27 DIAGNOSIS — L03116 Cellulitis of left lower limb: Secondary | ICD-10-CM | POA: Diagnosis not present

## 2021-07-27 DIAGNOSIS — U071 COVID-19: Secondary | ICD-10-CM | POA: Diagnosis present

## 2021-07-27 DIAGNOSIS — L0390 Cellulitis, unspecified: Secondary | ICD-10-CM | POA: Diagnosis not present

## 2021-07-27 LAB — BASIC METABOLIC PANEL
Anion gap: 8 (ref 5–15)
BUN: 12 mg/dL (ref 8–23)
CO2: 28 mmol/L (ref 22–32)
Calcium: 8 mg/dL — ABNORMAL LOW (ref 8.9–10.3)
Chloride: 102 mmol/L (ref 98–111)
Creatinine, Ser: 0.53 mg/dL — ABNORMAL LOW (ref 0.61–1.24)
GFR, Estimated: >60 mL/min (ref >60)
Glucose, Bld: 113 mg/dL — ABNORMAL HIGH (ref 70–99)
Potassium: 4.1 mmol/L (ref 3.5–5.1)
Sodium: 138 mmol/L (ref 135–145)

## 2021-07-27 LAB — CBC
HCT: 31.7 % — ABNORMAL LOW (ref 39.0–52.0)
Hemoglobin: 9.7 g/dL — ABNORMAL LOW (ref 13.0–17.0)
MCH: 28.2 pg (ref 26.0–34.0)
MCHC: 30.6 g/dL (ref 30.0–36.0)
MCV: 92.2 fL (ref 80.0–100.0)
Platelets: 288 10*3/uL (ref 150–400)
RBC: 3.44 MIL/uL — ABNORMAL LOW (ref 4.22–5.81)
RDW: 15.8 % — ABNORMAL HIGH (ref 11.5–15.5)
WBC: 6.9 10*3/uL (ref 4.0–10.5)
nRBC: 0 % (ref 0.0–0.2)

## 2021-07-27 MED ORDER — ASPIRIN 81 MG PO TBEC
81.0000 mg | DELAYED_RELEASE_TABLET | Freq: Every day | ORAL | 12 refills | Status: DC
  Filled 2021-07-27: qty 30, 30d supply, fill #0

## 2021-07-27 MED ORDER — CEFADROXIL 500 MG PO CAPS
500.0000 mg | ORAL_CAPSULE | Freq: Two times a day (BID) | ORAL | 0 refills | Status: AC
  Filled 2021-07-27: qty 10, 5d supply, fill #0

## 2021-07-27 MED ORDER — GERHARDT'S BUTT CREAM: 1.0000 | TOPICAL_CREAM | Freq: Four times a day (QID) | CUTANEOUS | 0 refills | Status: DC

## 2021-07-27 MED ORDER — TRIAMCINOLONE ACETONIDE 0.1 % EX CREA
TOPICAL_CREAM | Freq: Two times a day (BID) | CUTANEOUS | 0 refills | Status: AC
  Filled 2021-07-27: qty 30, 7d supply, fill #0

## 2021-07-27 MED ORDER — APIXABAN 5 MG PO TABS
5.0000 mg | ORAL_TABLET | Freq: Two times a day (BID) | ORAL | 0 refills | Status: DC
  Filled 2021-07-27: qty 60, 30d supply, fill #0

## 2021-07-27 NOTE — Discharge Summary (Signed)
Physician Discharge Summary   Patient: Casey Parker MRN: [id number] DOB: 08-23-1945  Admit date:     07/24/2021  Discharge date: 07/27/21  Discharge Physician: Berle Mull  PCP: Shon Baton, MD  Recommendations at discharge: Follow up with PCP in 1 week.  Referred to A fib clinic  Discharge Diagnoses: Principal Problem:   Cellulitis in setting of venous stasis dermatitis and lymphedema  Active Problems:   Lower urinary tract symptoms   Paroxysmal atrial fibrillation (HCC)   Transaminitis   Intertrigo   Normocytic anemia   Hypothyroid   Hypertension   hx of TIA   Hyperlipidemia   Pressure injury of skin   Assessment and Plan: * Cellulitis in setting of venous stasis dermatitis and lymphedema  76 year old male with known bilateral LE venous insufficiency and chronic lymphedema with recurrent cellulitis who follows with ID and was recently changed from daily antibiotics to PRN for cellulitis infection. He started taking his Duricef on Tuesday with worsening erythema, edema, slight pain in his legs found to have failed outpatient antibiotics for his recurrent LE cellulitis -doppler negative for DVT  -Patient started on IV Rocephin and subsequently changed to cefadroxil orally per ID recommendations.. -chronic lymphedema/venous stasis. Was supposed to go back to see vascular, for possible lymphedema pump in 03/2020 and never followed up with this  -Kenalog cream twice daily for 1 to 2 weeks, then Unna boots with Kenalog placed when Unna boots were changed. -Patient assessed by PT who are recommending SNF.   Paroxysmal atrial fibrillation (HCC) New finding, rate controlled and asymptomatic CHA2Ds2-Vasc of 5, discussed anticoagulation.  No contraindication at this time. 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   -Currently rate controlled. -Patient states was on Coumadin before in the past but not sure of the indication for it at that time and was around the time he had a stroke. -Continue Eliquis for anticoagulation. -Will need outpatient follow-up with cardiology.   Lower urinary tract symptoms Hx of UTI like symptoms with dark urine, frequency, confusion UA with trauma from in and out cath, but no obvious infection Urine culture was negative.   Was on Rocephin for cellulitis and transition to cefadroxil.    Transaminitis Very mild transaminitis Minimally elevated, continue statin and trend    Intertrigo Will be hard to get this under control as he is bed bound and not taking showers right now -discussed hygiene and keeping area dry -Clotrimazole cream was started but has been discontinued -Nystatin powder ordered but has been discontinued. s -large macerated areas on buttocks that are painful, wound care consulted and rxcs made.  -Place back on nystatin powder.   Normocytic anemia - Hemoglobin stable at 9.3. 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  Last scope in 2011 with 7 year recall, did not return for this.   Hypothyroid TSH within normal limits at 2.036.   -Continue home regimen Synthroid.     Hypertension Blood pressures soft/normal.  -Continue to hold Cozaar and could potentially resume if needed.   hx of TIA Continue statin and ASA Was on '325mg'$  ASA daily, changed to '81mg'$  since starting eliquis   Pressure injury of skin Stage 2 bilateral thigh, present on admission.  Wound care consulted.  Continue dressing as recommended   Pressure Injury 07/24/21 Thigh Left;Posterior;Proximal Stage  2 -  Partial thickness loss of dermis presenting as a shallow open injury with a red, pink wound bed without slough. (Active)  07/24/21 2200  Location: Thigh  Location Orientation: Left;Posterior;Proximal  Staging: Stage 2 -   Partial thickness loss of dermis presenting as a shallow open injury with a red, pink wound bed without slough.  Wound Description (Comments):   Present on Admission: Yes  Dressing Type None 07/26/21 2000     Pressure Injury Thigh Posterior;Proximal;Right Stage 2 -  Partial thickness loss of dermis presenting as a shallow open injury with a red, pink wound bed without slough. (Active)     Location: Thigh  Location Orientation: Posterior;Proximal;Right  Staging: Stage 2 -  Partial thickness loss of dermis presenting as a shallow open injury with a red, pink wound bed without slough.  Wound Description (Comments):   Present on Admission:   Dressing Type None 07/26/21 2000   Obesity  Increase the risk of worsening swelling due to limited mobility and recurrent infection Body mass index is 36.57 kg/m.  Placing the pt at higher risk of poor outcomes. Outpt dietary consult recommended   Consultants: ID Procedures performed:  Echocardiogram  Dulplex lower ext DISCHARGE MEDICATION: Allergies as of 07/27/2021       Reactions   Augmentin [amoxicillin-pot Clavulanate]    Other    bovine products -itching   Penicillins    Pt unsure if allergic to penicillin        Medication List     STOP taking these medications    aspirin 325 MG tablet Replaced by: aspirin EC 81 MG tablet   losartan 100 MG tablet Commonly known as: COZAAR   oxyCODONE-acetaminophen 5-325 MG tablet Commonly known as: Roxicet   silver sulfADIAZINE 1 % cream Commonly known as: Silvadene       TAKE these medications    apixaban 5 MG Tabs tablet Commonly known as: ELIQUIS Take 1 tablet (5 mg total) by mouth 2 (two) times daily.   aspirin EC 81 MG tablet Take 1 tablet (81 mg total) by mouth daily. Swallow whole. Start taking on: July 28, 2021 Replaces: aspirin 325 MG tablet   cefadroxil 500 MG capsule Commonly known as: DURICEF Take 1 capsule (500 mg total) by mouth 2 (two) times daily for 5  days. What changed: when to take this   cholecalciferol 25 MCG (1000 UNIT) tablet Commonly known as: VITAMIN D3 Take 1,000 Units by mouth daily.   FISH OIL PO Take 1,600 Units by mouth daily.   furosemide 20 MG tablet Commonly known as: LASIX Take 20 mg by mouth every other day.   Gerhardt's butt cream Crea Apply 1 Application topically 4 (four) times daily.   levothyroxine 125 MCG tablet Commonly known as: SYNTHROID Take 125 mcg by mouth daily.   magnesium 30 MG tablet Take 30 mg by mouth daily.   multivitamin tablet Take 1 tablet by mouth daily.   nystatin cream Commonly known as: MYCOSTATIN Apply 1 application. topically 2 (two) times daily.   rosuvastatin 20 MG tablet Commonly known as: CRESTOR Take 20 mg by mouth daily.   tamsulosin 0.4 MG Caps capsule Commonly known as: FLOMAX Take 0.4 mg by mouth.   triamcinolone cream 0.1 % Commonly known as: KENALOG Apply topically 2 (two) times daily for 7 days.               Discharge Care Instructions  (From admission, onward)  Start     Ordered   07/27/21 0000  Discharge wound care:       Comments: Wound care to partial thickness skin loss at bilateral posterior thighs, also perineal area, medial thighs and buttocks maceration:  Cleanse with house skin cleanser, pat dry. Apply a thin layer of Gerhart's Butt Cream. Turn side to side.  Use compression stockings next week.   07/27/21 1135            Follow-up Information     Golden Circle, FNP Follow up.   Specialty: Infectious Diseases Why: 08/04/21 at 9:30am via telehealth. Please call to reschedule if unable to make this appointment. Contact information: [address] [place] [address] [phone]         Shon Baton, MD. Schedule an appointment as soon as possible for a visit in 1 week(s).   Specialty: Internal Medicine Contact information: [address] [place] [address] [phone]                 Disposition: SNF Diet recommendation: Cardiac diet  Discharge Exam: Vitals:   07/26/21 1739 07/26/21 1946 07/27/21 0435 07/27/21 0722  BP: 129/68 123/69 112/67 124/68  Pulse: 63 71 68 68  Resp: '14 17 17 16  '$ Temp: 98 F (36.7 C) 98.4 F (36.9 C) 98.1 F (36.7 C) 98.9 F (37.2 C)  TempSrc: Oral Oral Oral Oral  SpO2: 97% 98% 100% 99%  Weight:      Height:       General: Appear in no distress; no visible Abnormal Neck Mass Or lumps, Conjunctiva normal Cardiovascular: S1 and S2 Present, no Murmur, Respiratory: good respiratory effort, Bilateral Air entry present and CTA, no Crackles, no wheezes Abdomen: Bowel Sound present, Non tender  Extremities: no Pedal edema, significant improvement in the redness an warmth.  Left leg below knee still warm.  Swelling has resolved.  Neurology: alert and oriented to time, place, and person  Gait not checked due to patient safety concerns  Filed Weights   07/25/21 0146  Weight: 105.9 kg   Condition at discharge: stable  The results of significant diagnostics from this hospitalization (including imaging, microbiology, ancillary and laboratory) are listed below for reference.   Imaging Studies: VAS [place] LOWER EXTREMITY VENOUS (DVT) (7a-7p)  Result Date: 07/25/2021  Lower Venous DVT Study Patient Name:  Casey Parker  Date of Exam:   07/24/2021 Medical Rec #: [id number]       Accession #:    [id number] Date of Birth: 1945-02-21        Patient Gender: M Patient Age:   70 years Exam Location:  [facility] Procedure:      VAS [place] LOWER EXTREMITY VENOUS (DVT) Referring Phys: Domenic Moras --------------------------------------------------------------------------------  Indications: Pain, Swelling, Erythema, and Cellulitis and venous stasis dermatitis.  Risk Factors: DVT 25 years ago per patient. Limitations: Body habitus, poor ultrasound/tissue interface and edema, patient unable to externally rotate legs secondary to severity of knee  problems. Comparison Study: No prior study Performing Technologist: Sharion Dove RVS  Examination Guidelines: A complete evaluation includes B-mode imaging, spectral Doppler, color Doppler, and power Doppler as needed of all accessible portions of each vessel. Bilateral testing is considered an integral part of a complete examination. Limited examinations for reoccurring indications may be performed as noted. The reflux portion of the exam is performed with the patient in reverse Trendelenburg.  +---------+---------------+---------+-----------+----------+-------------------+ RIGHT    CompressibilityPhasicitySpontaneityPropertiesThrombus Aging      +---------+---------------+---------+-----------+----------+-------------------+ CFV  Full           Yes      Yes                                      +---------+---------------+---------+-----------+----------+-------------------+ SFJ      Full                                                             +---------+---------------+---------+-----------+----------+-------------------+ FV Prox  Full                                                             +---------+---------------+---------+-----------+----------+-------------------+ FV Mid   Full                                                             +---------+---------------+---------+-----------+----------+-------------------+ FV DistalFull                                                             +---------+---------------+---------+-----------+----------+-------------------+ PFV      Full                                                             +---------+---------------+---------+-----------+----------+-------------------+ POP                                                   patent by color     +---------+---------------+---------+-----------+----------+-------------------+ PTV                                                   Not well  visualized +---------+---------------+---------+-----------+----------+-------------------+ PERO                                                  Not well visualized +---------+---------------+---------+-----------+----------+-------------------+   +---------+---------------+---------+-----------+----------+-------------------+ LEFT     CompressibilityPhasicitySpontaneityPropertiesThrombus Aging      +---------+---------------+---------+-----------+----------+-------------------+ CFV      Full           Yes      Yes                                      +---------+---------------+---------+-----------+----------+-------------------+  SFJ      Full                                                             +---------+---------------+---------+-----------+----------+-------------------+ FV Prox  Full                                                             +---------+---------------+---------+-----------+----------+-------------------+ FV Mid   Full                                                             +---------+---------------+---------+-----------+----------+-------------------+ FV DistalFull                                                             +---------+---------------+---------+-----------+----------+-------------------+ PFV      Full                                                             +---------+---------------+---------+-----------+----------+-------------------+ POP                     Yes      Yes                  patent by color and                                                       Doppler             +---------+---------------+---------+-----------+----------+-------------------+ PTV      Full                                                             +---------+---------------+---------+-----------+----------+-------------------+ PERO     Full                                                              +---------+---------------+---------+-----------+----------+-------------------+     Summary: RIGHT: - There is no evidence of deep vein thrombosis in the  lower extremity. However, portions of this examination were limited- see technologist comments above.  - Ultrasound characteristics of enlarged lymph nodes are noted in the groin.  LEFT: - There is no evidence of deep vein thrombosis in the lower extremity. However, portions of this examination were limited- see technologist comments above.  - Ultrasound characteristics of enlarged lymph nodes noted in the groin.  *See table(s) above for measurements and observations. Electronically signed by Servando Snare MD on 07/25/2021 at 3:15:31 PM.    Final    ECHOCARDIOGRAM COMPLETE  Result Date: 07/25/2021    ECHOCARDIOGRAM REPORT   Patient Name:   Casey Parker Date of Exam: 07/25/2021 Medical Rec #:  [id number]      Height:       67.0 in Accession #:    [id number]     Weight:       233.5 lb Date of Birth:  27-Jul-1945       BSA:          2.160 m Patient Age:    40 years       BP:           107/61 mmHg Patient Gender: M              HR:           66 bpm. Exam Location:  Inpatient Procedure: 2D Echo, Color Doppler and Cardiac Doppler Indications:    a-fib  History:        Patient has no prior history of Echocardiogram examinations.                 Risk Factors:Hypertension.  Sonographer:    Joette Catching RCS Referring Phys: [place]  1. Left ventricular ejection fraction, by estimation, is 60 to 65%. The left ventricle has normal function. The left ventricle has no regional wall motion abnormalities. 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 Left ventricular diastolic parameters are indeterminate.  2. Right ventricular systolic function is normal. The right ventricular size is normal. There is mildly elevated pulmonary artery systolic pressure. The estimated right ventricular systolic pressure is  25.3 mmHg.  3. Left atrial size was moderately dilated.  4. Moderate pleural effusion in the left lateral region.  5. The mitral valve is normal in structure. Trivial mitral valve regurgitation. No evidence of mitral stenosis.  6. The aortic valve is tricuspid. Aortic valve regurgitation is mild. No aortic stenosis is present.  7. The inferior vena cava is dilated in size with >50% respiratory variability, suggesting right atrial pressure of 8 mmHg. FINDINGS  Left Ventricle: Left ventricular ejection fraction, by estimation, is 60 to 65%. The left ventricle has normal function. The left ventricle has no regional wall motion abnormalities. The left ventricular internal cavity size was small. 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 Left ventricular diastolic parameters are indeterminate. Right Ventricle: The right ventricular size is normal. No increase in right ventricular wall thickness. Right ventricular systolic function is normal. There is mildly elevated pulmonary artery systolic pressure. The tricuspid regurgitant velocity is 2.86  m/s, and with an assumed right atrial pressure of 8 mmHg, the estimated right ventricular systolic pressure is 66.4 mmHg. Left Atrium: Left atrial size was moderately dilated. Right Atrium: Right atrial size was normal in size. Pericardium: There is no evidence of pericardial effusion. Mitral Valve: The mitral valve is normal in structure. Trivial mitral valve regurgitation. No evidence of mitral valve  stenosis. Tricuspid Valve: The tricuspid valve is normal in structure. Tricuspid valve regurgitation is trivial. Aortic Valve: The aortic valve is tricuspid. Aortic valve regurgitation is mild. Aortic regurgitation PHT measures 920 msec. No aortic stenosis is present. Aortic valve mean gradient measures 5.0 mmHg. Aortic valve peak gradient measures 9.4 mmHg. Aortic  valve area, by VTI measures 3.11 cm. Pulmonic Valve: The pulmonic valve was  not well visualized. Pulmonic valve regurgitation is not visualized. Aorta: The aortic root is normal in size and structure. Venous: The inferior vena cava is dilated in size with greater than 50% respiratory variability, suggesting right atrial pressure of 8 mmHg. IAS/Shunts: The interatrial septum was not well visualized. Additional Comments: There is a moderate pleural effusion in the left lateral region.  LEFT VENTRICLE PLAX 2D LVIDd:         3.20 cm   Diastology LVIDs:         2.10 cm   LV e' medial:    7.07 cm/s LV PW:         1.50 cm   LV E/e' medial:  12.1 LV IVS:        1.60 cm   LV e' lateral:   13.10 cm/s LVOT diam:     2.50 cm   LV E/e' lateral: 6.6 LV SV:         88 LV SV Index:   41 LVOT Area:     4.91 cm  RIGHT VENTRICLE             IVC RV S prime:     17.00 cm/s  IVC diam: 2.50 cm TAPSE (M-mode): 2.0 cm LEFT ATRIUM             Index        RIGHT ATRIUM           Index LA diam:        5.50 cm 2.55 cm/m   RA Area:     15.10 cm LA Vol (A2C):   94.8 ml 43.90 ml/m  RA Volume:   31.20 ml  14.45 ml/m LA Vol (A4C):   98.6 ml 45.65 ml/m LA Biplane Vol: 98.0 ml 45.38 ml/m  AORTIC VALVE                     PULMONIC VALVE AV Area (Vmax):    3.21 cm      PV Vmax:          1.18 m/s AV Area (Vmean):   3.17 cm      PV Peak grad:     5.6 mmHg AV Area (VTI):     3.11 cm      PR End Diast Vel: 12.39 msec AV Vmax:           153.00 cm/s AV Vmean:          109.000 cm/s AV VTI:            0.284 m AV Peak Grad:      9.4 mmHg AV Mean Grad:      5.0 mmHg LVOT Vmax:         100.00 cm/s LVOT Vmean:        70.400 cm/s LVOT VTI:          0.180 m LVOT/AV VTI ratio: 0.63 AI PHT:            920 msec  AORTA Ao Root diam: 3.90 cm MITRAL VALVE  TRICUSPID VALVE MV Area (PHT): 4.33 cm    TR Peak grad:   32.7 mmHg MV Decel Time: 175 msec    TR Vmax:        286.00 cm/s MV E velocity: 85.90 cm/s                            SHUNTS                            Systemic VTI:  0.18 m                            Systemic  Diam: 2.50 cm Oswaldo Milian MD Electronically signed by Oswaldo Milian MD Signature Date/Time: 07/25/2021/2:06:11 PM    Final    CT HEAD WO CONTRAST (5MM)  Result Date: 07/24/2021 CLINICAL DATA:  Mental status change.  Unknown cause. EXAM: CT HEAD WITHOUT CONTRAST TECHNIQUE: Contiguous axial images were obtained from the base of the skull through the vertex without intravenous contrast. RADIATION DOSE REDUCTION: This exam was performed according to the departmental dose-optimization program which includes automated exposure control, adjustment of the mA and/or kV according to patient size and/or use of iterative reconstruction technique. COMPARISON:  CT examination dated December 19, 2008 FINDINGS: Brain: No evidence of acute infarction, hemorrhage, hydrocephalus, extra-axial collection or mass lesion/mass effect. Prominence of the ventricles and sulci secondary to moderate cerebral volume loss. Low-attenuation of the periventricular and subcortical white matter presumed chronic microvascular ischemic changes. Vascular: No hyperdense vessel or unexpected calcification. Skull: Normal. Negative for fracture or focal lesion. Sinuses/Orbits: No acute finding. Other: None. IMPRESSION: 1. No acute intracranial abnormality. 2. Moderate cerebral atrophy and chronic microvascular ischemic changes of the white matter. Electronically Signed   By: Keane Police D.O.   On: 07/24/2021 15:27    Microbiology: Results for orders placed or performed during the hospital encounter of 07/24/21  Urine Culture     Status: None   Collection Time: 07/24/21  1:21 PM   Specimen: In/Out Cath Urine  Result Value Ref Range Status   Specimen Description IN/OUT CATH URINE  Final   Special Requests NONE  Final   Culture   Final    NO GROWTH Performed at [facility], [place] [address] [place], [place] [address]    Report Status 07/25/2021 FINAL  Final  Blood culture (routine x 2)     Status: None (Preliminary  result)   Collection Time: 07/24/21  2:45 PM   Specimen: BLOOD  Result Value Ref Range Status   Specimen Description BLOOD RIGHT ANTECUBITAL  Final   Special Requests   Final    BOTTLES DRAWN AEROBIC AND ANAEROBIC Blood Culture results may not be optimal due to an inadequate volume of blood received in culture bottles   Culture   Final    NO GROWTH 3 DAYS Performed at [facility], [place] [address], [place], [place] [address]    Report Status PENDING  Incomplete  Blood culture (routine x 2)     Status: None (Preliminary result)   Collection Time: 07/24/21  2:50 PM   Specimen: BLOOD LEFT HAND  Result Value Ref Range Status   Specimen Description BLOOD LEFT HAND  Final   Special Requests   Final    BOTTLES DRAWN AEROBIC AND ANAEROBIC Blood Culture adequate volume   Culture  Final    NO GROWTH 3 DAYS Performed at [facility], [place] [address], [place], [place] [address]    Report Status PENDING  Incomplete    Labs: CBC: Recent Labs  Lab 07/24/21 0855 07/25/21 0044 07/26/21 0222 07/27/21 0136  WBC 10.0 6.2 6.5 6.9  NEUTROABS 8.7*  --   --   --   HGB 11.7* 9.1* 9.3* 9.7*  HCT 36.4* 29.0* 30.0* 31.7*  MCV 90.5 89.8 91.5 92.2  PLT 320 281 300 240   Basic Metabolic Panel: Recent Labs  Lab 07/24/21 0855 07/25/21 0044 07/26/21 0222 07/27/21 0136  NA 137 131* 138 138  K 3.6 3.1* 4.2 4.1  CL 98 101 103 102  CO2 '26 24 26 28  '$ GLUCOSE 129* 167* 110* 113*  BUN '15 15 14 12  '$ CREATININE 0.87 0.72 0.60* 0.53*  CALCIUM 9.0 7.5* 8.0* 8.0*  MG  --  2.0  --   --    Liver Function Tests: Recent Labs  Lab 07/24/21 0855  AST 47*  ALT 51*  ALKPHOS 183*  BILITOT 0.6  PROT 6.1*  ALBUMIN 2.1*   CBG: No results for input(s): "GLUCAP" in the last 168 hours.  Discharge time spent: greater than 30 minutes.  Signed: Berle Mull, MD [facility]

## 2021-07-27 NOTE — Progress Notes (Signed)
Physical Therapy Treatment Patient Details Name: Casey Parker MRN: [id number] DOB: 03/21/1945 Today's Date: 07/27/2021   History of Present Illness Pt is a 76 y/o male who presents to acute care physical therapy due to bilateral cellulitis in setting of venous stasis dermatitis and lyphedema. Pt has PMH of HTN, obesity, TIA, afib, and hypothyroidism.    PT Comments    Pt required maximal to total assistance x 2 for bed mobility and transfers. Pt limited by muscle weakness, decreased bilateral knee ROM, fatigue, and pain. Pt also with poor ankle dorsiflexion bilaterally both actively and passively although increased ROM passively.  Recommendations for follow up therapy are one component of a multi-disciplinary discharge planning process, led by the attending physician.  Recommendations may be updated based on patient status, additional functional criteria and insurance authorization.  Follow Up Recommendations  Skilled nursing-short term rehab (<3 hours/day) Can patient physically be transported by private vehicle: No   Assistance Recommended at Discharge Frequent or constant Supervision/Assistance  Patient can return home with the following Two people to help with walking and/or transfers;Two people to help with bathing/dressing/bathroom;Assist for transportation;Assistance with cooking/housework;A lot of help with bathing/dressing/bathroom;A lot of help with walking and/or transfers   Equipment Recommendations  None recommended by PT (going to SNF)    Recommendations for Other Services OT consult     Precautions / Restrictions Precautions Precautions: Fall (wounds to bilateral LE's posteriorly including buttocks)     Mobility  Bed Mobility Overal bed mobility: Needs Assistance Bed Mobility: Supine to Sit, Sit to Supine, Rolling Rolling: Max assist, +2 for physical assistance   Supine to sit: Max assist, +2 for physical assistance, +2 for safety/equipment Sit to supine: Total  assist, +2 for physical assistance, +2 for safety/equipment   General bed mobility comments: Pt screaming in pain when being mobilized. Pt required increased time to complete tasks. Pt able to sit at EOB for several minutes with and without UE assistance. Pt unable to keep feet from sliding forwards on floor despite cues. Retropulsion present at times.    Transfers Overall transfer level: Needs assistance Equipment used:  Charlaine Dalton) Transfers: Sit to/from Stand Sit to Stand: Max assist, +2 physical assistance, +2 safety/equipment           General transfer comment: Pt still unable to bring hips forwards and stood with slouched posture despite cues. Pt with severe knee crepitus and hyperextension on right. Pt unable to fully fit in [place] due to body habitus and pt's positioning. Pt required blocking of feet to prevent from sliding forwards. Pt brought back to sitting at EOB when pain and fatigue became worse.    Ambulation/Gait               General Gait Details: unable   Stairs             Wheelchair Mobility    Modified Rankin (Stroke Patients Only)       Balance Overall balance assessment: Needs assistance Sitting-balance support: No upper extremity supported, Feet unsupported, Single extremity supported, Bilateral upper extremity supported Sitting balance-Leahy Scale: Fair     Standing balance support: Bilateral upper extremity supported Standing balance-Leahy Scale: Poor                              Cognition Arousal/Alertness: Awake/alert Behavior During Therapy: WFL for tasks assessed/performed Overall Cognitive Status: Within Functional Limits for tasks assessed  Exercises General Exercises - Lower Extremity Ankle Circles/Pumps: AROM, Both, 10 reps (DF only) Heel Slides: Both, 10 reps, Supine     General Comments General comments (skin integrity, edema, etc.): HR increased with  activity as expected      Pertinent Vitals/Pain Pain Assessment Pain Assessment: 0-10 Pain Score: 0-No pain (at rest) Pain Location: bilateral LE's Pain Descriptors / Indicators: Grimacing, Moaning (severe pain with movement) Pain Intervention(s): Limited activity within patient's tolerance, Monitored during session, Premedicated before session, Utilized relaxation techniques    Home Living                          Prior Function            PT Goals (current goals can now be found in the care plan section) Acute Rehab PT Goals Patient Stated Goal: Get out of hospital PT Goal Formulation: With patient Time For Goal Achievement: 08/08/21 Potential to Achieve Goals: Fair Progress towards PT goals: Progressing toward goals (limited progress)    Frequency    Min 3X/week      PT Plan Current plan remains appropriate    Co-evaluation              AM-PAC PT "6 Clicks" Mobility   Outcome Measure  Help needed turning from your back to your side while in a flat bed without using bedrails?: Total Help needed moving from lying on your back to sitting on the side of a flat bed without using bedrails?: Total Help needed moving to and from a bed to a chair (including a wheelchair)?: Total Help needed standing up from a chair using your arms (e.g., wheelchair or bedside chair)?: Total Help needed to walk in hospital room?: Total Help needed climbing 3-5 steps with a railing? : Total 6 Click Score: 6    End of Session Equipment Utilized During Treatment: Gait belt Activity Tolerance: Patient limited by fatigue;Patient limited by pain Patient left: in bed;with family/visitor present Nurse Communication: Mobility status;Patient requests pain meds PT Visit Diagnosis: History of falling (Z91.81);Difficulty in walking, not elsewhere classified (R26.2);Pain;Muscle weakness (generalized) (M62.81)     Time: 2440-1027 PT Time Calculation (min) (ACUTE ONLY): 28  min  Charges:  $Therapeutic Activity: 23-37 mins                     Donna Bernard, PT    [facility] 07/27/2021, 1:33 PM

## 2021-07-27 NOTE — Plan of Care (Signed)

## 2021-07-27 NOTE — TOC Transition Note (Addendum)
Transition of Care [facility]) - CM/SW Discharge Note   Patient Details  Name: Casey Parker MRN: [id number] Date of Birth: 1945/11/19  Transition of Care [facility]) CM/SW Contact:  Tresa Endo Phone Number: 07/27/2021, 12:30 PM   Clinical Narrative:    Patient will DC to: [facility]  Anticipated DC date: 07/27/2021 Family notified: Pt Private Nurse Transport by: [name]   Per MD patient ready for DC to [facility] room 105. RN to call report prior to discharge [phone]). RN, patient, patient's family, and facility notified of DC. Discharge Summary and FL2 sent to facility. DC packet on chart. Ambulance transport requested for patient.   CSW will sign off for now as social work intervention is no longer needed. Please consult [place] again if new needs arise.           Patient Goals and CMS Choice Patient states their goals for this hospitalization and ongoing recovery are:: to go to rehab CMS Medicare.gov Compare Post Acute Care list provided to:: Patient Choice offered to / list presented to : Patient  Discharge Placement                       Discharge Plan and Services   Discharge Planning Services: CM Consult            DME Arranged: N/A DME Agency: NA       HH Arranged: NA          Social Determinants of Health (SDOH) Interventions     Readmission Risk Interventions     No data to display

## 2021-07-27 NOTE — Plan of Care (Signed)
Vitals stable, no significant event overnight.  Problem: Education: Goal: Knowledge of General Education information will improve Description: Including pain rating scale, medication(s)/side effects and non-pharmacologic comfort measures Outcome: Progressing   Problem: Health Behavior/Discharge Planning: Goal: Ability to manage health-related needs will improve Outcome: Progressing   Problem: Clinical Measurements: Goal: Ability to maintain clinical measurements within normal limits will improve Outcome: Progressing Goal: Will remain free from infection Outcome: Progressing Goal: Diagnostic test results will improve Outcome: Progressing Goal: Respiratory complications will improve Outcome: Progressing Goal: Cardiovascular complication will be avoided Outcome: Progressing   Problem: Activity: Goal: Risk for activity intolerance will decrease Outcome: Progressing   Problem: Nutrition: Goal: Adequate nutrition will be maintained Outcome: Progressing   Problem: Coping: Goal: Level of anxiety will decrease Outcome: Progressing   Problem: Elimination: Goal: Will not experience complications related to bowel motility Outcome: Progressing Goal: Will not experience complications related to urinary retention Outcome: Progressing   Problem: Pain Managment: Goal: General experience of comfort will improve Outcome: Progressing   Problem: Safety: Goal: Ability to remain free from injury will improve Outcome: Progressing   Problem: Skin Integrity: Goal: Risk for impaired skin integrity will decrease Outcome: Progressing

## 2021-07-29 LAB — CULTURE, BLOOD (ROUTINE X 2)
Culture: NO GROWTH
Culture: NO GROWTH
Special Requests: ADEQUATE

## 2021-07-29 NOTE — Progress Notes (Deleted)
Referring-John Virgina Jock MD Reason for referral-Atrial fibrillation  HPI: 76 yo male for evaluation of atrial fibrillation at request of Shon Baton MD. Nuclear study 12/10 showed EF 59 and normal perfusion. ABIs 6/16 normal. Carotid dopplers 7/20 showed 1-49 bilateral stenosis. Echo 6/23 showed normal LV function, basal septal hypertrophy, moderate LAE, mild AI. Lower ext venous dopplers 6/23 showed no DVT. Pt has chronic lymphedema. Recently admitted with cellulitis; also noted to be in atrial fibrillation. Cardiology now asked to evaluate.   Current Outpatient Medications  Medication Sig Dispense Refill   apixaban (ELIQUIS) 5 MG TABS tablet Take 1 tablet (5 mg total) by mouth 2 (two) times daily. 60 tablet 0   aspirin EC 81 MG tablet Take 1 tablet (81 mg total) by mouth daily. Swallow whole. 30 tablet 12   cefadroxil (DURICEF) 500 MG capsule Take 1 capsule (500 mg total) by mouth 2 (two) times daily for 5 days. 10 capsule 0   cholecalciferol (VITAMIN D3) 25 MCG (1000 UNIT) tablet Take 1,000 Units by mouth daily.     furosemide (LASIX) 20 MG tablet Take 20 mg by mouth every other day.     levothyroxine (SYNTHROID, LEVOTHROID) 125 MCG tablet Take 125 mcg by mouth daily.     magnesium 30 MG tablet Take 30 mg by mouth daily.     Multiple Vitamin (MULTIVITAMIN) tablet Take 1 tablet by mouth daily.     Nystatin (GERHARDT'S BUTT CREAM) CREA Apply 1 Application topically 4 (four) times daily. 1 each 0   nystatin cream (MYCOSTATIN) Apply 1 application. topically 2 (two) times daily. 30 g 0   Omega-3 Fatty Acids (FISH OIL PO) Take 1,600 Units by mouth daily.     rosuvastatin (CRESTOR) 20 MG tablet Take 20 mg by mouth daily.     tamsulosin (FLOMAX) 0.4 MG CAPS capsule Take 0.4 mg by mouth.     triamcinolone cream (KENALOG) 0.1 % Apply topically 2 (two) times daily for 7 days. 30 g 0   No current facility-administered medications for this visit.    Allergies  Allergen Reactions   Augmentin  [Amoxicillin-Pot Clavulanate]    Other     bovine products -itching   Penicillins     Pt unsure if allergic to penicillin     Past Medical History:  Diagnosis Date   Cellulitis    recurrent   Diastolic dysfunction    DVT (deep venous thrombosis) (HCC)    20 yrs ago   Enlarged prostate    Hyperlipidemia    Hypertension    Intertrigo 04/18/2021   Left ventricular hypertrophy    Migraines    H/O   Obesity    Osteoarthritis    Stroke ([place])    11-2008 no residual problems   Thyroid disease    hypo   Transient ischemic attack (TIA)    presumed   Venous stasis dermatitis 03/15/2021    Past Surgical History:  Procedure Laterality Date   HAND SURGERY     right   HERNIA REPAIR     TONSILLECTOMY     TOTAL HIP ARTHROPLASTY Right 08/21/2014   Procedure: RIGHT TOTAL HIP ARTHROPLASTY ANTERIOR APPROACH;  Surgeon: Mcarthur Rossetti, MD;  Location: [facility];  Service: Orthopedics;  Laterality: Right;    Social History   Socioeconomic History   Marital status: Married    Spouse name: Not on file   Number of children: Not on file   Years of education: Not on file   Highest education  level: Not on file  Occupational History   Not on file  Tobacco Use   Smoking status: Former    Types: Cigarettes    Quit date: 01/31/1988    Years since quitting: 33.5   Smokeless tobacco: Never  Substance and Sexual Activity   Alcohol use: Yes    Comment: on social occassions   Drug use: No   Sexual activity: Not on file  Other Topics Concern   Not on file  Social History Narrative   Not on file   Social Determinants of Health   Financial Resource Strain: Not on file  Food Insecurity: Not on file  Transportation Needs: Not on file  Physical Activity: Not on file  Stress: Not on file  Social Connections: Not on file  Intimate Partner Violence: Not on file    Family History  Problem Relation Age of Onset   Hypertension Father     ROS: no fevers or chills, productive cough,  hemoptysis, dysphasia, odynophagia, melena, hematochezia, dysuria, hematuria, rash, seizure activity, orthopnea, PND, pedal edema, claudication. Remaining systems are negative.  Physical Exam:   There were no vitals taken for this visit.  General:  Well developed/well nourished in NAD Skin warm/dry Patient not depressed No peripheral clubbing Back-normal HEENT-normal/normal eyelids Neck supple/normal carotid upstroke bilaterally; no bruits; no JVD; no thyromegaly chest - CTA/ normal expansion CV - RRR/normal S1 and S2; no murmurs, rubs or gallops;  PMI nondisplaced Abdomen -NT/ND, no HSM, no mass, + bowel sounds, no bruit 2+ femoral pulses, no bruits Ext-no edema, chords, 2+ DP Neuro-grossly nonfocal  ECG - 07/24/21 atrial fibrillation with nonspecific ST changes; personally reviewed  A/P  1 persistent atrial fibrillation-duration of atrial fibrillation is unknown.  Patient's heart rate is controlled on the medications.  CHA2DS2-VASc is 5.  Continue apixaban.  Discontinue aspirin.  Note recent echo showed normal LV function.  Recent TSH also normal.  2 hypertension-patient's blood pressure is controlled.  Continue present medications and follow-up.  3 hyperlipidemia-continue statin.  4 carotid artery disease-mild on previous Dopplers.  Will repeat.  5 lymphedema-continue diuretics.  Kirk Ruths, MD

## 2021-08-01 ENCOUNTER — Ambulatory Visit: Payer: Medicare Other | Admitting: Cardiology

## 2021-08-01 DIAGNOSIS — I4891 Unspecified atrial fibrillation: Secondary | ICD-10-CM | POA: Diagnosis not present

## 2021-08-01 DIAGNOSIS — L0390 Cellulitis, unspecified: Secondary | ICD-10-CM | POA: Diagnosis not present

## 2021-08-01 DIAGNOSIS — I10 Essential (primary) hypertension: Secondary | ICD-10-CM | POA: Diagnosis not present

## 2021-08-01 DIAGNOSIS — N400 Enlarged prostate without lower urinary tract symptoms: Secondary | ICD-10-CM | POA: Diagnosis not present

## 2021-08-01 DIAGNOSIS — D649 Anemia, unspecified: Secondary | ICD-10-CM | POA: Diagnosis not present

## 2021-08-01 DIAGNOSIS — E039 Hypothyroidism, unspecified: Secondary | ICD-10-CM | POA: Diagnosis not present

## 2021-08-02 DIAGNOSIS — L0390 Cellulitis, unspecified: Secondary | ICD-10-CM | POA: Diagnosis not present

## 2021-08-02 DIAGNOSIS — E039 Hypothyroidism, unspecified: Secondary | ICD-10-CM | POA: Diagnosis not present

## 2021-08-02 DIAGNOSIS — M623 Immobility syndrome (paraplegic): Secondary | ICD-10-CM | POA: Diagnosis not present

## 2021-08-02 DIAGNOSIS — N400 Enlarged prostate without lower urinary tract symptoms: Secondary | ICD-10-CM | POA: Diagnosis not present

## 2021-08-02 DIAGNOSIS — M1990 Unspecified osteoarthritis, unspecified site: Secondary | ICD-10-CM | POA: Diagnosis not present

## 2021-08-02 DIAGNOSIS — Q752 Hypertelorism: Secondary | ICD-10-CM | POA: Diagnosis not present

## 2021-08-02 DIAGNOSIS — I4891 Unspecified atrial fibrillation: Secondary | ICD-10-CM | POA: Diagnosis not present

## 2021-08-03 ENCOUNTER — Ambulatory Visit: Payer: Medicare Other | Admitting: Podiatry

## 2021-08-04 ENCOUNTER — Telehealth: Payer: Medicare Other | Admitting: Family

## 2021-08-04 ENCOUNTER — Other Ambulatory Visit: Payer: Self-pay

## 2021-08-10 DIAGNOSIS — I4891 Unspecified atrial fibrillation: Secondary | ICD-10-CM | POA: Diagnosis not present

## 2021-08-10 DIAGNOSIS — N400 Enlarged prostate without lower urinary tract symptoms: Secondary | ICD-10-CM | POA: Diagnosis not present

## 2021-08-10 DIAGNOSIS — I10 Essential (primary) hypertension: Secondary | ICD-10-CM | POA: Diagnosis not present

## 2021-08-31 DIAGNOSIS — I4891 Unspecified atrial fibrillation: Secondary | ICD-10-CM | POA: Diagnosis not present

## 2021-08-31 DIAGNOSIS — E039 Hypothyroidism, unspecified: Secondary | ICD-10-CM | POA: Diagnosis not present

## 2021-08-31 DIAGNOSIS — M623 Immobility syndrome (paraplegic): Secondary | ICD-10-CM | POA: Diagnosis not present

## 2021-08-31 DIAGNOSIS — E785 Hyperlipidemia, unspecified: Secondary | ICD-10-CM | POA: Diagnosis not present

## 2021-08-31 DIAGNOSIS — N400 Enlarged prostate without lower urinary tract symptoms: Secondary | ICD-10-CM | POA: Diagnosis not present

## 2021-09-01 ENCOUNTER — Encounter (HOSPITAL_COMMUNITY): Payer: Self-pay

## 2021-09-01 ENCOUNTER — Ambulatory Visit (HOSPITAL_COMMUNITY)
Admission: RE | Admit: 2021-09-01 | Discharge: 2021-09-01 | Disposition: A | Discharge status: Home / Self Care | Payer: No Typology Code available for payment source | Source: Ambulatory Visit | Attending: Nurse Practitioner | Admitting: Nurse Practitioner

## 2021-09-01 ENCOUNTER — Ambulatory Visit (HOSPITAL_COMMUNITY)
Admission: RE | Admit: 2021-09-01 | Discharge: 2021-09-01 | Disposition: A | Discharge status: Home / Self Care | Payer: Medicare Other | Source: Ambulatory Visit | Attending: Nurse Practitioner | Admitting: Nurse Practitioner

## 2021-09-01 ENCOUNTER — Encounter (HOSPITAL_COMMUNITY): Payer: Self-pay | Admitting: Nurse Practitioner

## 2021-09-01 VITALS — BP 140/66 | HR 59 | Ht 67.0 in | Wt 224.0 lb

## 2021-09-01 DIAGNOSIS — Z8673 Personal history of transient ischemic attack (TIA), and cerebral infarction without residual deficits: Secondary | ICD-10-CM | POA: Diagnosis not present

## 2021-09-01 DIAGNOSIS — I480 Paroxysmal atrial fibrillation: Secondary | ICD-10-CM

## 2021-09-01 DIAGNOSIS — D6869 Other thrombophilia: Secondary | ICD-10-CM

## 2021-09-01 DIAGNOSIS — L03115 Cellulitis of right lower limb: Secondary | ICD-10-CM | POA: Insufficient documentation

## 2021-09-01 DIAGNOSIS — L03116 Cellulitis of left lower limb: Secondary | ICD-10-CM | POA: Insufficient documentation

## 2021-09-01 DIAGNOSIS — Z7401 Bed confinement status: Secondary | ICD-10-CM | POA: Insufficient documentation

## 2021-09-01 DIAGNOSIS — I4891 Unspecified atrial fibrillation: Secondary | ICD-10-CM | POA: Insufficient documentation

## 2021-09-01 DIAGNOSIS — I872 Venous insufficiency (chronic) (peripheral): Secondary | ICD-10-CM | POA: Diagnosis not present

## 2021-09-01 DIAGNOSIS — L89899 Pressure ulcer of other site, unspecified stage: Secondary | ICD-10-CM | POA: Diagnosis not present

## 2021-09-01 NOTE — Progress Notes (Signed)
Primary Care Physician: Shon Baton, MD Referring Physician: Admission f/u    Casey Parker is a 76 y.o. male with a h/o bilateral LE venous insufficiency and chronic lymphedema with recurrent cellulitis who follows with ID and was recently changed from daily antibiotics to PRN for cellulitis infection. He started taking his Duricef on Tuesday with worsening erythema, edema, slight pain in his legs found to have failed outpatient antibiotics for his recurrent LE cellulitis.  Doppler negative for DVT. Patient started on IV Rocephin and subsequently changed to cefadroxil orally per ID recommendation.  He was also found to have a UTI and a new finding of afib with rate controlled and asymptomatic. Echo showed normal EF with severe asymmetrical LVH, mod dilated left atrium. Was on coumadin in the past around the time of a prior stroke. Started on eliquis in the hospital.  He is bed bound with a pressure ulcers of bilateral thigh.   He is in the afib clinic today in a w/c accompanied by his caregiver. He lives at [facility]. No complaints voiced today. He is trying to find a PCP that will come to him as it is very difficult to get to a doctor's appointment. Wraps on LE's.   Today, he denies symptoms of palpitations, chest pain, shortness of breath, orthopnea, PND, lower extremity edema, dizziness, presyncope, syncope, or neurologic sequela. The patient is tolerating medications without difficulties and is otherwise without complaint today.   Past Medical History:  Diagnosis Date   Cellulitis    recurrent   Diastolic dysfunction    DVT (deep venous thrombosis) (HCC)    20 yrs ago   Enlarged prostate    Hyperlipidemia    Hypertension    Intertrigo 04/18/2021   Left ventricular hypertrophy    Migraines    H/O   Obesity    Osteoarthritis    Stroke ([place])    11-2008 no residual problems   Thyroid disease    hypo   Transient ischemic attack (TIA)    presumed   Venous stasis dermatitis  03/15/2021   Past Surgical History:  Procedure Laterality Date   HAND SURGERY     right   HERNIA REPAIR     TONSILLECTOMY     TOTAL HIP ARTHROPLASTY Right 08/21/2014   Procedure: RIGHT TOTAL HIP ARTHROPLASTY ANTERIOR APPROACH;  Surgeon: Mcarthur Rossetti, MD;  Location: [facility];  Service: Orthopedics;  Laterality: Right;    Current Outpatient Medications  Medication Sig Dispense Refill   apixaban (ELIQUIS) 5 MG TABS tablet Take 1 tablet (5 mg total) by mouth 2 (two) times daily. 60 tablet 0   aspirin EC 81 MG tablet Take 1 tablet (81 mg total) by mouth daily. Swallow whole. 30 tablet 12   cholecalciferol (VITAMIN D3) 25 MCG (1000 UNIT) tablet Take 1,000 Units by mouth daily.     furosemide (LASIX) 20 MG tablet Take 20 mg by mouth every other day.     levothyroxine (SYNTHROID, LEVOTHROID) 125 MCG tablet Take 125 mcg by mouth daily.     magnesium 30 MG tablet Take 30 mg by mouth daily.     Multiple Vitamin (MULTIVITAMIN) tablet Take 1 tablet by mouth daily.     Nystatin (GERHARDT'S BUTT CREAM) CREA Apply 1 Application topically 4 (four) times daily. 1 each 0   nystatin cream (MYCOSTATIN) Apply 1 application. topically 2 (two) times daily. 30 g 0   Omega-3 Fatty Acids (FISH OIL PO) Take 1,600 Units by mouth daily.  rosuvastatin (CRESTOR) 20 MG tablet Take 20 mg by mouth daily.     tamsulosin (FLOMAX) 0.4 MG CAPS capsule Take 0.4 mg by mouth.     No current facility-administered medications for this encounter.    Allergies  Allergen Reactions   Augmentin [Amoxicillin-Pot Clavulanate]    Other     bovine products -itching   Penicillins     Pt unsure if allergic to penicillin    Social History   Socioeconomic History   Marital status: Married    Spouse name: Not on file   Number of children: Not on file   Years of education: Not on file   Highest education level: Not on file  Occupational History   Not on file  Tobacco Use   Smoking status: Former    Types:  Cigarettes    Quit date: 01/31/1988    Years since quitting: 33.6   Smokeless tobacco: Never  Substance and Sexual Activity   Alcohol use: Yes    Comment: on social occassions   Drug use: No   Sexual activity: Not on file  Other Topics Concern   Not on file  Social History Narrative   Not on file   Social Determinants of Health   Financial Resource Strain: Not on file  Food Insecurity: Not on file  Transportation Needs: Not on file  Physical Activity: Not on file  Stress: Not on file  Social Connections: Not on file  Intimate Partner Violence: Not on file    Family History  Problem Relation Age of Onset   Hypertension Father     ROS- All systems are reviewed and negative except as per the HPI above  Physical Exam: Vitals:   09/01/21 1111  Weight: 101.6 kg  Height: '5\' 7"'$  (1.702 m)   Wt Readings from Last 3 Encounters:  09/01/21 101.6 kg  07/25/21 105.9 kg  04/18/21 (!) 138.8 kg    Labs: Lab Results  Component Value Date   NA 138 07/27/2021   K 4.1 07/27/2021   CL 102 07/27/2021   CO2 28 07/27/2021   GLUCOSE 113 (H) 07/27/2021   BUN 12 07/27/2021   CREATININE 0.53 (L) 07/27/2021   CALCIUM 8.0 (L) 07/27/2021   MG 2.0 07/25/2021   Lab Results  Component Value Date   INR 1.14 08/14/2014   Lab Results  Component Value Date   CHOL (H) 12/20/2008    243        ATP III CLASSIFICATION:  <200     mg/dL   Desirable  200-239  mg/dL   Borderline High  >=240    mg/dL   High          HDL 41 12/20/2008   LDLCALC (H) 12/20/2008    177        Total Cholesterol/HDL:CHD Risk Coronary Heart Disease Risk Table                     Men   Women  1/2 Average Risk   3.4   3.3  Average Risk       5.0   4.4  2 X Average Risk   9.6   7.1  3 X Average Risk  23.4   11.0        Use the calculated Patient Ratio above and the CHD Risk Table to determine the patient's CHD Risk.        ATP III CLASSIFICATION (LDL):  <100     mg/dL  Optimal  100-129  mg/dL   Near or  Above                    Optimal  130-159  mg/dL   Borderline  160-189  mg/dL   High  >190     mg/dL   Very High   TRIG 124 12/20/2008     GEN- The patient is well appearing, alert and oriented x 3 today.   Head- normocephalic, atraumatic Eyes-  Sclera clear, conjunctiva pink Ears- hearing intact Oropharynx- clear Neck- supple, no JVP Lymph- no cervical lymphadenopathy Lungs- Clear to ausculation bilaterally, normal work of breathing Heart- Regular rate and rhythm, no murmurs, rubs or gallops, PMI not laterally displaced GI- soft, NT, ND, + BS Extremities- no clubbing, cyanosis, or edema MS- no significant deformity or atrophy Skin- no rash or lesion Psych- euthymic mood, full affect Neuro- strength and sensation are intact  EKG-Vent. rate 59 BPM PR interval * ms QRS duration 100 ms QT/QTcB 456/451 ms P-R-T axes * 3 30 Junctional rhythm Nonspecific T wave abnormality Abnormal ECG When compared with ECG of 24-Jul-2021 19:09, PREVIOUS ECG IS PRESENT  Echo- 1. Left ventricular ejection fraction, by estimation, is 60 to 65%. The  left ventricle has normal function. The left ventricle has no regional  wall motion abnormalities. There is severe asymmetric left ventricular  hypertrophy of the basal-septal  segment. Left ventricular diastolic parameters are indeterminate.   2. Right ventricular systolic function is normal. The right ventricular  size is normal. There is mildly elevated pulmonary artery systolic  pressure. The estimated right ventricular systolic pressure is 46.5 mmHg.   3. Left atrial size was moderately dilated.   4. Moderate pleural effusion in the left lateral region.   5. The mitral valve is normal in structure. Trivial mitral valve  regurgitation. No evidence of mitral stenosis.   6. The aortic valve is tricuspid. Aortic valve regurgitation is mild. No  aortic stenosis is present.   7. The inferior vena cava is dilated in size with >50% respiratory   variability, suggesting right atrial pressure of 8 mmHg.   Assessment and Plan:  1. New onset  afib Was rate controlled and asymptomatic   Found during recent hospitalization for bilateral cellulitis  Now appears to be in a junctional rhythm One week Zio patch placed Not on rate control   2. CHA2DS2VASc score of at least 5 Continue on eliquis 5 mg bid ASA was reduced to 81 mg daily  3. Chronic LE cellulitis  Per wound care   4. H/o TIA Continue asa/eliquis   F/u will be made after results of monitor are known  Geroge Baseman. Ramesses Crampton, [facility] [address] [place], [place] [address] [phone]

## 2021-09-05 DIAGNOSIS — I10 Essential (primary) hypertension: Secondary | ICD-10-CM | POA: Diagnosis not present

## 2021-09-05 DIAGNOSIS — B372 Candidiasis of skin and nail: Secondary | ICD-10-CM | POA: Diagnosis not present

## 2021-09-05 DIAGNOSIS — I480 Paroxysmal atrial fibrillation: Secondary | ICD-10-CM | POA: Diagnosis not present

## 2021-09-05 DIAGNOSIS — S41112A Laceration without foreign body of left upper arm, initial encounter: Secondary | ICD-10-CM | POA: Diagnosis not present

## 2021-09-07 DIAGNOSIS — S41112A Laceration without foreign body of left upper arm, initial encounter: Secondary | ICD-10-CM | POA: Diagnosis not present

## 2021-09-07 DIAGNOSIS — L0390 Cellulitis, unspecified: Secondary | ICD-10-CM | POA: Diagnosis not present

## 2021-09-08 DIAGNOSIS — N401 Enlarged prostate with lower urinary tract symptoms: Secondary | ICD-10-CM | POA: Diagnosis not present

## 2021-09-08 DIAGNOSIS — R3914 Feeling of incomplete bladder emptying: Secondary | ICD-10-CM | POA: Diagnosis not present

## 2021-09-08 DIAGNOSIS — Z125 Encounter for screening for malignant neoplasm of prostate: Secondary | ICD-10-CM | POA: Diagnosis not present

## 2021-09-12 DIAGNOSIS — S41112A Laceration without foreign body of left upper arm, initial encounter: Secondary | ICD-10-CM | POA: Diagnosis not present

## 2021-09-12 DIAGNOSIS — L0390 Cellulitis, unspecified: Secondary | ICD-10-CM | POA: Diagnosis not present

## 2021-09-14 DIAGNOSIS — R609 Edema, unspecified: Secondary | ICD-10-CM | POA: Diagnosis not present

## 2021-09-14 DIAGNOSIS — S41112A Laceration without foreign body of left upper arm, initial encounter: Secondary | ICD-10-CM | POA: Diagnosis not present

## 2021-09-14 DIAGNOSIS — L0390 Cellulitis, unspecified: Secondary | ICD-10-CM | POA: Diagnosis not present

## 2021-09-14 DIAGNOSIS — L304 Erythema intertrigo: Secondary | ICD-10-CM | POA: Diagnosis not present

## 2021-09-15 DIAGNOSIS — R3914 Feeling of incomplete bladder emptying: Secondary | ICD-10-CM | POA: Diagnosis not present

## 2021-09-15 DIAGNOSIS — I480 Paroxysmal atrial fibrillation: Secondary | ICD-10-CM | POA: Diagnosis not present

## 2021-09-19 NOTE — Addendum Note (Signed)
Encounter addended by: Juluis Mire, RN on: 09/19/2021 11:24 AM  Actions taken: Imaging Exam ended

## 2021-09-20 ENCOUNTER — Encounter (HOSPITAL_COMMUNITY): Payer: Self-pay | Admitting: *Deleted

## 2021-09-21 DIAGNOSIS — R609 Edema, unspecified: Secondary | ICD-10-CM | POA: Diagnosis not present

## 2021-09-21 DIAGNOSIS — L304 Erythema intertrigo: Secondary | ICD-10-CM | POA: Diagnosis not present

## 2021-09-21 DIAGNOSIS — S41112A Laceration without foreign body of left upper arm, initial encounter: Secondary | ICD-10-CM | POA: Diagnosis not present

## 2021-09-21 DIAGNOSIS — L0390 Cellulitis, unspecified: Secondary | ICD-10-CM | POA: Diagnosis not present

## 2021-09-22 DIAGNOSIS — R3914 Feeling of incomplete bladder emptying: Secondary | ICD-10-CM | POA: Diagnosis not present

## 2021-09-22 DIAGNOSIS — E039 Hypothyroidism, unspecified: Secondary | ICD-10-CM | POA: Diagnosis not present

## 2021-09-22 DIAGNOSIS — L0390 Cellulitis, unspecified: Secondary | ICD-10-CM | POA: Diagnosis not present

## 2021-09-22 DIAGNOSIS — E785 Hyperlipidemia, unspecified: Secondary | ICD-10-CM | POA: Diagnosis not present

## 2021-09-22 DIAGNOSIS — N401 Enlarged prostate with lower urinary tract symptoms: Secondary | ICD-10-CM | POA: Diagnosis not present

## 2021-09-22 DIAGNOSIS — N312 Flaccid neuropathic bladder, not elsewhere classified: Secondary | ICD-10-CM | POA: Diagnosis not present

## 2021-09-22 DIAGNOSIS — Z125 Encounter for screening for malignant neoplasm of prostate: Secondary | ICD-10-CM | POA: Diagnosis not present

## 2021-09-22 DIAGNOSIS — M79673 Pain in unspecified foot: Secondary | ICD-10-CM | POA: Diagnosis not present

## 2021-09-22 DIAGNOSIS — N400 Enlarged prostate without lower urinary tract symptoms: Secondary | ICD-10-CM | POA: Diagnosis not present

## 2021-09-22 DIAGNOSIS — I4891 Unspecified atrial fibrillation: Secondary | ICD-10-CM | POA: Diagnosis not present

## 2021-09-23 DIAGNOSIS — M79672 Pain in left foot: Secondary | ICD-10-CM | POA: Diagnosis not present

## 2021-09-26 DIAGNOSIS — M79673 Pain in unspecified foot: Secondary | ICD-10-CM | POA: Diagnosis not present

## 2021-09-29 ENCOUNTER — Ambulatory Visit (INDEPENDENT_AMBULATORY_CARE_PROVIDER_SITE_OTHER): Payer: Medicare Other

## 2021-09-29 ENCOUNTER — Ambulatory Visit (INDEPENDENT_AMBULATORY_CARE_PROVIDER_SITE_OTHER): Payer: Medicare Other | Admitting: Orthopaedic Surgery

## 2021-09-29 ENCOUNTER — Encounter: Payer: Self-pay | Admitting: Orthopaedic Surgery

## 2021-09-29 DIAGNOSIS — M79672 Pain in left foot: Secondary | ICD-10-CM

## 2021-09-29 NOTE — Progress Notes (Signed)
Office Visit Note   Patient: Casey Parker           Date of Birth: 1945/04/01           MRN: [id number] Visit Date: 09/29/2021              Requested by: Shon Baton, [place],  [place] [address] PCP: Shon Baton, MD   Assessment & Plan: Visit Diagnoses:  1. Pain of left heel     Plan: Impression is acute left heel pain.  At this point, we will go ahead and order a stat CT scan to assess for occult fracture due to the severity with bearing weight.  We will follow-up with [place] once has been completed.  Total face to face encounter time was greater than 25 minutes and over half of this time was spent in counseling and/or coordination of care.   Follow-Up Instructions: Return for after ct scan.   Orders:  Orders Placed This Encounter  Procedures   XR Os Calcis Left   CT ANKLE LEFT WO CONTRAST   No orders of the defined types were placed in this encounter.     Procedures: No procedures performed   Clinical Data: No additional findings.   Subjective: Chief Complaint  Patient presents with   Left Foot - Pain    HPI patient is a pleasant 76 year old gentleman who is here today with his caregiver.  He is here with left heel pain for the past week.  The patient has a history of bilateral lower extremity lymphedema and has been on diuretics and has used TED hose.  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  He has undergone bilateral knee geniculate blocks which helped for a period of time.  His symptoms returned once the blocks were off.  He most recently developed underlying bilateral lower extremity cellulitis for which she was hospitalized.  He has been in a skilled nursing facility following his discharge from the hospital.  Of note, he has been primarily chair bound for the past 1 to 2 years due to the underlying knee pain.  Over the past week or so, he has  has tried to take steps with his walker and therapy.  Last week, he was at a urology appointment when he stood up and was unable to bear any weight to his left heel.  No specific injury.  He has had significant pain trying to bear weight to the left heel since.  The pain appears to be to the plantar aspect of the heel as well as to the retrocalcaneus.  Increased pain with not only bearing weight but with dorsiflexion.  He also has some pain when wearing his compression sock.  Review of Systems as detailed in HPI.  All others reviewed and are negative.   Objective: Vital Signs: There were no vitals taken for this visit.  Physical Exam well-developed well-nourished gentleman in no acute distress.  Alert oriented x3.  Ortho Exam left lower extremity reveals lymphedema.  He has mild to moderate tenderness to the retrocalcaneus as well as the plantar fascia insertion at the heel.  Slight pain with dorsiflexion.  He is able to plantarflex against my hand.  Unable to perform [facility] test as he is unable to bear weight to transfer to the table.  No palpable deformity along the Achilles.  Specialty Comments:  No specialty  comments available.  Imaging: No results found.   PMFS History: Patient Active Problem List   Diagnosis Date Noted   Pressure injury of skin 07/25/2021   Lower urinary tract symptoms 07/24/2021   Transaminitis 07/24/2021   Normocytic anemia 07/24/2021   Paroxysmal atrial fibrillation ([place]) 07/24/2021   Intertrigo 04/18/2021   Venous stasis dermatitis 03/15/2021   Cellulitis and abscess of foot, except toes 09/07/2020   Skin maceration 09/07/2020   Chronic knee pain after total replacement of left knee joint 04/12/2020   Pain in left elbow 01/01/2020   Unilateral primary osteoarthritis, left knee 09/01/2019   Unilateral primary osteoarthritis, right knee 09/01/2019   Obesity, Class III, BMI 40-49.9 (morbid obesity) ([place]) 05/03/2016   Hypertension 05/03/2016   Hypothyroid  05/03/2016   Hyperlipidemia 05/03/2016   Osteoarthritis of right hip 08/21/2014   Status post total replacement of right hip 08/21/2014   Osteoarthritis    Migraines    Cellulitis in setting of venous stasis dermatitis and lymphedema     hx of TIA    Left ventricular hypertrophy    Past Medical History:  Diagnosis Date   Cellulitis    recurrent   Diastolic dysfunction    DVT (deep venous thrombosis) ([place])    20 yrs ago   Enlarged prostate    Hyperlipidemia    Hypertension    Intertrigo 04/18/2021   Left ventricular hypertrophy    Migraines    H/O   Obesity    Osteoarthritis    Stroke ([place])    11-2008 no residual problems   Thyroid disease    hypo   Transient ischemic attack (TIA)    presumed   Venous stasis dermatitis 03/15/2021    Family History  Problem Relation Age of Onset   Hypertension Father     Past Surgical History:  Procedure Laterality Date   HAND SURGERY     right   HERNIA REPAIR     TONSILLECTOMY     TOTAL HIP ARTHROPLASTY Right 08/21/2014   Procedure: RIGHT TOTAL HIP ARTHROPLASTY ANTERIOR APPROACH;  Surgeon: Mcarthur Rossetti, MD;  Location: [facility];  Service: Orthopedics;  Laterality: Right;   Social History   Occupational History   Not on file  Tobacco Use   Smoking status: Former    Types: Cigarettes    Quit date: 01/31/1988    Years since quitting: 33.6   Smokeless tobacco: Never  Substance and Sexual Activity   Alcohol use: Yes    Comment: on social occassions   Drug use: No   Sexual activity: Not on file

## 2021-09-30 ENCOUNTER — Other Ambulatory Visit: Payer: Medicare Other

## 2021-09-30 ENCOUNTER — Ambulatory Visit (HOSPITAL_COMMUNITY)
Admission: RE | Admit: 2021-09-30 | Discharge: 2021-09-30 | Disposition: A | Discharge status: Home / Self Care | Payer: Medicare Other | Source: Ambulatory Visit | Attending: Orthopaedic Surgery | Admitting: Orthopaedic Surgery

## 2021-09-30 DIAGNOSIS — M7989 Other specified soft tissue disorders: Secondary | ICD-10-CM | POA: Diagnosis not present

## 2021-09-30 DIAGNOSIS — M79672 Pain in left foot: Secondary | ICD-10-CM | POA: Diagnosis not present

## 2021-10-05 ENCOUNTER — Other Ambulatory Visit: Payer: Self-pay | Admitting: Family Medicine

## 2021-10-05 DIAGNOSIS — H2513 Age-related nuclear cataract, bilateral: Secondary | ICD-10-CM | POA: Diagnosis not present

## 2021-10-05 DIAGNOSIS — Z9189 Other specified personal risk factors, not elsewhere classified: Secondary | ICD-10-CM

## 2021-10-07 ENCOUNTER — Ambulatory Visit (INDEPENDENT_AMBULATORY_CARE_PROVIDER_SITE_OTHER): Payer: Medicare Other | Admitting: Orthopaedic Surgery

## 2021-10-07 ENCOUNTER — Encounter: Payer: Self-pay | Admitting: Orthopaedic Surgery

## 2021-10-07 DIAGNOSIS — S92002A Unspecified fracture of left calcaneus, initial encounter for closed fracture: Secondary | ICD-10-CM | POA: Diagnosis not present

## 2021-10-07 NOTE — Progress Notes (Signed)
Office Visit Note   Patient: Casey Parker           Date of Birth: 26-Aug-1945           MRN: [id number] Visit Date: 10/07/2021              Requested by: Shon Baton, [place],  [place] [address] PCP: Shon Baton, MD   Assessment & Plan: Visit Diagnoses:  1. Closed nondisplaced fracture of left calcaneus, unspecified portion of calcaneus, initial encounter     Plan: Impression is nondisplaced fracture of the left calcaneus.  This should be amenable to nonoperative treatment.  I would like to place the patient in a cam walker left lower extremity and weight-bear only with transfers, but he has significant lymphedema to the left lower extremity and will not fit in the cam walker.  He will remain nonweightbearing to the left lower extremity as best as possible.  He will follow-up with [place] in 4 weeks for repeat evaluation.  Call with concerns or questions in the meantime.  Follow-Up Instructions: Return in about 4 weeks (around 11/04/2021).   Orders:  No orders of the defined types were placed in this encounter.  No orders of the defined types were placed in this encounter.     Procedures: No procedures performed   Clinical Data: No additional findings.   Subjective: Chief Complaint  Patient presents with   Left Ankle - Follow-up    CT scan review    HPI patient is a pleasant 76 year old gentleman who comes in today to discuss CT results of the left ankle.  He has been complaining of left heel pain for few weeks now.  No specific injury.  Recent CT scan of the ankle showed a nondisplaced fracture of the posterior calcaneus.     Objective: Vital Signs: There were no vitals taken for this visit.    Ortho Exam unchanged left heel exam  Specialty Comments:  No specialty comments available.  Imaging: No new imaging   PMFS History: Patient Active Problem List   Diagnosis Date Noted   Pressure injury of skin 07/25/2021   Lower urinary tract  symptoms 07/24/2021   Transaminitis 07/24/2021   Normocytic anemia 07/24/2021   Paroxysmal atrial fibrillation ([place]) 07/24/2021   Intertrigo 04/18/2021   Venous stasis dermatitis 03/15/2021   Cellulitis and abscess of foot, except toes 09/07/2020   Skin maceration 09/07/2020   Chronic knee pain after total replacement of left knee joint 04/12/2020   Pain in left elbow 01/01/2020   Unilateral primary osteoarthritis, left knee 09/01/2019   Unilateral primary osteoarthritis, right knee 09/01/2019   Obesity, Class III, BMI 40-49.9 (morbid obesity) ([place]) 05/03/2016   Hypertension 05/03/2016   Hypothyroid 05/03/2016   Hyperlipidemia 05/03/2016   Osteoarthritis of right hip 08/21/2014   Status post total replacement of right hip 08/21/2014   Osteoarthritis    Migraines    Cellulitis in setting of venous stasis dermatitis and lymphedema     hx of TIA    Left ventricular hypertrophy    Past Medical History:  Diagnosis Date   Cellulitis    recurrent   Diastolic dysfunction    DVT (deep venous thrombosis) ([place])    20 yrs ago   Enlarged prostate    Hyperlipidemia    Hypertension    Intertrigo 04/18/2021   Left ventricular hypertrophy    Migraines    H/O   Obesity    Osteoarthritis    Stroke ([place])  11-2008 no residual problems   Thyroid disease    hypo   Transient ischemic attack (TIA)    presumed   Venous stasis dermatitis 03/15/2021    Family History  Problem Relation Age of Onset   Hypertension Father     Past Surgical History:  Procedure Laterality Date   HAND SURGERY     right   HERNIA REPAIR     TONSILLECTOMY     TOTAL HIP ARTHROPLASTY Right 08/21/2014   Procedure: RIGHT TOTAL HIP ARTHROPLASTY ANTERIOR APPROACH;  Surgeon: Mcarthur Rossetti, MD;  Location: [facility];  Service: Orthopedics;  Laterality: Right;   Social History   Occupational History   Not on file  Tobacco Use   Smoking status: Former    Types: Cigarettes    Quit date: 01/31/1988    Years  since quitting: 33.7   Smokeless tobacco: Never  Substance and Sexual Activity   Alcohol use: Yes    Comment: on social occassions   Drug use: No   Sexual activity: Not on file

## 2021-10-10 DIAGNOSIS — M79673 Pain in unspecified foot: Secondary | ICD-10-CM | POA: Diagnosis not present

## 2021-10-19 DIAGNOSIS — M79673 Pain in unspecified foot: Secondary | ICD-10-CM | POA: Diagnosis not present

## 2021-10-31 DIAGNOSIS — I4891 Unspecified atrial fibrillation: Secondary | ICD-10-CM | POA: Diagnosis not present

## 2021-10-31 DIAGNOSIS — R609 Edema, unspecified: Secondary | ICD-10-CM | POA: Diagnosis not present

## 2021-10-31 DIAGNOSIS — N400 Enlarged prostate without lower urinary tract symptoms: Secondary | ICD-10-CM | POA: Diagnosis not present

## 2021-10-31 DIAGNOSIS — E039 Hypothyroidism, unspecified: Secondary | ICD-10-CM | POA: Diagnosis not present

## 2021-10-31 DIAGNOSIS — E559 Vitamin D deficiency, unspecified: Secondary | ICD-10-CM | POA: Diagnosis not present

## 2021-10-31 DIAGNOSIS — L0390 Cellulitis, unspecified: Secondary | ICD-10-CM | POA: Diagnosis not present

## 2021-10-31 DIAGNOSIS — E785 Hyperlipidemia, unspecified: Secondary | ICD-10-CM | POA: Diagnosis not present

## 2021-11-04 ENCOUNTER — Ambulatory Visit (INDEPENDENT_AMBULATORY_CARE_PROVIDER_SITE_OTHER): Payer: Medicare Other

## 2021-11-04 ENCOUNTER — Encounter: Payer: Self-pay | Admitting: Orthopaedic Surgery

## 2021-11-04 ENCOUNTER — Ambulatory Visit (INDEPENDENT_AMBULATORY_CARE_PROVIDER_SITE_OTHER): Payer: Medicare Other | Admitting: Orthopaedic Surgery

## 2021-11-04 DIAGNOSIS — S92002A Unspecified fracture of left calcaneus, initial encounter for closed fracture: Secondary | ICD-10-CM | POA: Diagnosis not present

## 2021-11-04 NOTE — Progress Notes (Signed)
Patient: Casey Parker           Date of Birth: 03/26/1945           MRN: [id number] Visit Date: 11/04/2021 PCP: Shon Baton, MD   Assessment & Plan:  Chief Complaint:  Chief Complaint  Patient presents with   Left Foot - Pain    Calcaneus fracture   Visit Diagnoses:  1. Closed nondisplaced fracture of left calcaneus, unspecified portion of calcaneus, initial encounter     Plan: Reda returns today for follow-up of his left calcaneus fracture.  He is with his caretaker today.  He has been nonweightbearing.  Patient transfers with a Harrel Lemon lift which was present even before the calcaneus fracture.  He was unable to tolerate the cam boot and he is currently wearing a regular shoe which she is doing fine with.  Examination of the left foot shows improved swelling.  There is no wounds or ulcers.  X-rays demonstrate evidence of continued healing of the fracture.  Alignment is unchanged.  Due to the osteopenia would like to keep him nonweightbearing for another 3 weeks for a total of 8 weeks.  We will have him come back in 3 weeks to get repeat x-rays of the heel.  Follow-Up Instructions: Return in about 3 weeks (around 11/25/2021).   Orders:  Orders Placed This Encounter  Procedures   XR Os Calcis Left   No orders of the defined types were placed in this encounter.   Imaging: XR Os Calcis Left  Result Date: 11/04/2021 X-rays demonstrate stable alignment of the calcaneus fracture.  There does appear to be evidence fracture consolidation.  Generalized osteopenia.   PMFS History: Patient Active Problem List   Diagnosis Date Noted   Pressure injury of skin 07/25/2021   Lower urinary tract symptoms 07/24/2021   Transaminitis 07/24/2021   Normocytic anemia 07/24/2021   Paroxysmal atrial fibrillation ([place]) 07/24/2021   Intertrigo 04/18/2021   Venous stasis dermatitis 03/15/2021   Cellulitis and abscess of foot, except toes 09/07/2020   Skin maceration 09/07/2020   Chronic  knee pain after total replacement of left knee joint 04/12/2020   Pain in left elbow 01/01/2020   Unilateral primary osteoarthritis, left knee 09/01/2019   Unilateral primary osteoarthritis, right knee 09/01/2019   Obesity, Class III, BMI 40-49.9 (morbid obesity) ([place]) 05/03/2016   Hypertension 05/03/2016   Hypothyroid 05/03/2016   Hyperlipidemia 05/03/2016   Osteoarthritis of right hip 08/21/2014   Status post total replacement of right hip 08/21/2014   Osteoarthritis    Migraines    Cellulitis in setting of venous stasis dermatitis and lymphedema     hx of TIA    Left ventricular hypertrophy    Past Medical History:  Diagnosis Date   Cellulitis    recurrent   Diastolic dysfunction    DVT (deep venous thrombosis) ([place])    20 yrs ago   Enlarged prostate    Hyperlipidemia    Hypertension    Intertrigo 04/18/2021   Left ventricular hypertrophy    Migraines    H/O   Obesity    Osteoarthritis    Stroke ([place])    11-2008 no residual problems   Thyroid disease    hypo   Transient ischemic attack (TIA)    presumed   Venous stasis dermatitis 03/15/2021    Family History  Problem Relation Age of Onset   Hypertension Father     Past Surgical History:  Procedure Laterality Date   HAND SURGERY  right   HERNIA REPAIR     TONSILLECTOMY     TOTAL HIP ARTHROPLASTY Right 08/21/2014   Procedure: RIGHT TOTAL HIP ARTHROPLASTY ANTERIOR APPROACH;  Surgeon: Mcarthur Rossetti, MD;  Location: [facility];  Service: Orthopedics;  Laterality: Right;   Social History   Occupational History   Not on file  Tobacco Use   Smoking status: Former    Types: Cigarettes    Quit date: 01/31/1988    Years since quitting: 33.7   Smokeless tobacco: Never  Substance and Sexual Activity   Alcohol use: Yes    Comment: on social occassions   Drug use: No   Sexual activity: Not on file

## 2021-11-07 DIAGNOSIS — M79673 Pain in unspecified foot: Secondary | ICD-10-CM | POA: Diagnosis not present

## 2021-11-07 DIAGNOSIS — B372 Candidiasis of skin and nail: Secondary | ICD-10-CM | POA: Diagnosis not present

## 2021-11-11 DIAGNOSIS — E559 Vitamin D deficiency, unspecified: Secondary | ICD-10-CM | POA: Diagnosis not present

## 2021-11-11 DIAGNOSIS — Z79899 Other long term (current) drug therapy: Secondary | ICD-10-CM | POA: Diagnosis not present

## 2021-11-14 DIAGNOSIS — R21 Rash and other nonspecific skin eruption: Secondary | ICD-10-CM | POA: Diagnosis not present

## 2021-11-14 DIAGNOSIS — E8809 Other disorders of plasma-protein metabolism, not elsewhere classified: Secondary | ICD-10-CM | POA: Diagnosis not present

## 2021-11-25 ENCOUNTER — Ambulatory Visit (INDEPENDENT_AMBULATORY_CARE_PROVIDER_SITE_OTHER): Payer: Medicare Other

## 2021-11-25 ENCOUNTER — Encounter: Payer: Self-pay | Admitting: Orthopaedic Surgery

## 2021-11-25 ENCOUNTER — Ambulatory Visit (INDEPENDENT_AMBULATORY_CARE_PROVIDER_SITE_OTHER): Payer: Medicare Other | Admitting: Orthopaedic Surgery

## 2021-11-25 DIAGNOSIS — S92002A Unspecified fracture of left calcaneus, initial encounter for closed fracture: Secondary | ICD-10-CM | POA: Diagnosis not present

## 2021-11-25 NOTE — Progress Notes (Signed)
Office Visit Note   Patient: Casey Parker           Date of Birth: 12-May-1945           MRN: [id number] Visit Date: 11/25/2021              Requested by: Shon Baton, [address] [place],  [place] [address] PCP: Shon Baton, MD   Assessment & Plan: Visit Diagnoses:  1. Closed nondisplaced fracture of left calcaneus, unspecified portion of calcaneus, initial encounter     Plan: Impression is 8 weeks status post left calcaneus fracture.  This point, we will allow right to weight-bear as tolerated although this is highly unlikely given he has been using a Hoyer lift even prior to this fracture.  He will follow-up with [place] in 6 weeks for repeat evaluation and x-rays of the os calcis.  Call with concerns or questions in the meantime.  Follow-Up Instructions: Return in about 6 years (around 11/26/2027).   Orders:  Orders Placed This Encounter  Procedures   XR Os Calcis Left   Ambulatory referral to Orthopedic Surgery   No orders of the defined types were placed in this encounter.     Procedures: No procedures performed   Clinical Data: No additional findings.   Subjective: Chief Complaint  Patient presents with   Left Foot - Follow-up    Left calcaneus fracture    HPI patient comes in for follow-up.  He is currently residing at [facility].  He is approximately 8 weeks status post left calcaneus fracture.  He has not been bearing weight as he uses a [employer] fast streamer which he was using prior to the heel fracture.  Overall, he is feeling okay.   Review of Systems as detailed in HPI.  All others reviewed and are negative.   Objective: Vital Signs: There were no vitals taken for this visit.  Physical Exam well-developed well-nourished gentleman in no acute distress.  Alert and oriented x3.  Ortho Exam left heel exam reveals no tenderness.  He does have mild discomfort with squeeze test.  Painless range of motion.  He is neurovascular intact distally.  Specialty  Comments:  No specialty comments available.  Imaging: XR Os Calcis Left  Result Date: 11/25/2021 X-rays demonstrate consolidation of the calcaneus fracture.  There has not been any interval collapse of the calcaneus.    PMFS History: Patient Active Problem List   Diagnosis Date Noted   Pressure injury of skin 07/25/2021   Lower urinary tract symptoms 07/24/2021   Transaminitis 07/24/2021   Normocytic anemia 07/24/2021   Paroxysmal atrial fibrillation ([place]) 07/24/2021   Intertrigo 04/18/2021   Venous stasis dermatitis 03/15/2021   Cellulitis and abscess of foot, except toes 09/07/2020   Skin maceration 09/07/2020   Chronic knee pain after total replacement of left knee joint 04/12/2020   Pain in left elbow 01/01/2020   Unilateral primary osteoarthritis, left knee 09/01/2019   Unilateral primary osteoarthritis, right knee 09/01/2019   Obesity, Class III, BMI 40-49.9 (morbid obesity) ([place]) 05/03/2016   Hypertension 05/03/2016   Hypothyroid 05/03/2016   Hyperlipidemia 05/03/2016   Osteoarthritis of right hip 08/21/2014   Status post total replacement of right hip 08/21/2014   Osteoarthritis    Migraines    Cellulitis in setting of venous stasis dermatitis and lymphedema     hx of TIA    Left ventricular hypertrophy    Past Medical History:  Diagnosis Date   Cellulitis    recurrent  Diastolic dysfunction    DVT (deep venous thrombosis) ([facility])    20 yrs ago   Enlarged prostate    Hyperlipidemia    Hypertension    Intertrigo 04/18/2021   Left ventricular hypertrophy    Migraines    H/O   Obesity    Osteoarthritis    Stroke ([place])    11-2008 no residual problems   Thyroid disease    hypo   Transient ischemic attack (TIA)    presumed   Venous stasis dermatitis 03/15/2021    Family History  Problem Relation Age of Onset   Hypertension Father     Past Surgical History:  Procedure Laterality Date   HAND SURGERY     right   HERNIA REPAIR     TONSILLECTOMY      TOTAL HIP ARTHROPLASTY Right 08/21/2014   Procedure: RIGHT TOTAL HIP ARTHROPLASTY ANTERIOR APPROACH;  Surgeon: Mcarthur Rossetti, MD;  Location: [facility];  Service: Orthopedics;  Laterality: Right;   Social History   Occupational History   Not on file  Tobacco Use   Smoking status: Former    Types: Cigarettes    Quit date: 01/31/1988    Years since quitting: 33.8   Smokeless tobacco: Never  Substance and Sexual Activity   Alcohol use: Yes    Comment: on social occassions   Drug use: No   Sexual activity: Not on file

## 2021-12-02 DIAGNOSIS — S92002A Unspecified fracture of left calcaneus, initial encounter for closed fracture: Secondary | ICD-10-CM | POA: Diagnosis not present

## 2021-12-02 DIAGNOSIS — M6281 Muscle weakness (generalized): Secondary | ICD-10-CM | POA: Diagnosis not present

## 2021-12-02 DIAGNOSIS — L03116 Cellulitis of left lower limb: Secondary | ICD-10-CM | POA: Diagnosis not present

## 2021-12-02 DIAGNOSIS — R2689 Other abnormalities of gait and mobility: Secondary | ICD-10-CM | POA: Diagnosis not present

## 2021-12-02 DIAGNOSIS — R279 Unspecified lack of coordination: Secondary | ICD-10-CM | POA: Diagnosis not present

## 2021-12-05 DIAGNOSIS — M6281 Muscle weakness (generalized): Secondary | ICD-10-CM | POA: Diagnosis not present

## 2021-12-05 DIAGNOSIS — L03116 Cellulitis of left lower limb: Secondary | ICD-10-CM | POA: Diagnosis not present

## 2021-12-05 DIAGNOSIS — R279 Unspecified lack of coordination: Secondary | ICD-10-CM | POA: Diagnosis not present

## 2021-12-05 DIAGNOSIS — R2689 Other abnormalities of gait and mobility: Secondary | ICD-10-CM | POA: Diagnosis not present

## 2021-12-06 DIAGNOSIS — R2689 Other abnormalities of gait and mobility: Secondary | ICD-10-CM | POA: Diagnosis not present

## 2021-12-06 DIAGNOSIS — L03116 Cellulitis of left lower limb: Secondary | ICD-10-CM | POA: Diagnosis not present

## 2021-12-06 DIAGNOSIS — M6281 Muscle weakness (generalized): Secondary | ICD-10-CM | POA: Diagnosis not present

## 2021-12-06 DIAGNOSIS — R279 Unspecified lack of coordination: Secondary | ICD-10-CM | POA: Diagnosis not present

## 2021-12-07 DIAGNOSIS — R2689 Other abnormalities of gait and mobility: Secondary | ICD-10-CM | POA: Diagnosis not present

## 2021-12-07 DIAGNOSIS — L03116 Cellulitis of left lower limb: Secondary | ICD-10-CM | POA: Diagnosis not present

## 2021-12-07 DIAGNOSIS — M6281 Muscle weakness (generalized): Secondary | ICD-10-CM | POA: Diagnosis not present

## 2021-12-07 DIAGNOSIS — R279 Unspecified lack of coordination: Secondary | ICD-10-CM | POA: Diagnosis not present

## 2021-12-08 ENCOUNTER — Ambulatory Visit (INDEPENDENT_AMBULATORY_CARE_PROVIDER_SITE_OTHER): Payer: Medicare Other | Admitting: Orthopedic Surgery

## 2021-12-08 DIAGNOSIS — I890 Lymphedema, not elsewhere classified: Secondary | ICD-10-CM | POA: Diagnosis not present

## 2021-12-08 DIAGNOSIS — M6281 Muscle weakness (generalized): Secondary | ICD-10-CM | POA: Diagnosis not present

## 2021-12-08 DIAGNOSIS — I87333 Chronic venous hypertension (idiopathic) with ulcer and inflammation of bilateral lower extremity: Secondary | ICD-10-CM | POA: Diagnosis not present

## 2021-12-08 DIAGNOSIS — L03116 Cellulitis of left lower limb: Secondary | ICD-10-CM | POA: Diagnosis not present

## 2021-12-08 DIAGNOSIS — R279 Unspecified lack of coordination: Secondary | ICD-10-CM | POA: Diagnosis not present

## 2021-12-08 DIAGNOSIS — R2689 Other abnormalities of gait and mobility: Secondary | ICD-10-CM | POA: Diagnosis not present

## 2021-12-09 DIAGNOSIS — R2689 Other abnormalities of gait and mobility: Secondary | ICD-10-CM | POA: Diagnosis not present

## 2021-12-09 DIAGNOSIS — M6281 Muscle weakness (generalized): Secondary | ICD-10-CM | POA: Diagnosis not present

## 2021-12-09 DIAGNOSIS — R279 Unspecified lack of coordination: Secondary | ICD-10-CM | POA: Diagnosis not present

## 2021-12-09 DIAGNOSIS — L03116 Cellulitis of left lower limb: Secondary | ICD-10-CM | POA: Diagnosis not present

## 2021-12-12 ENCOUNTER — Telehealth: Payer: Self-pay | Admitting: Orthopaedic Surgery

## 2021-12-12 DIAGNOSIS — R279 Unspecified lack of coordination: Secondary | ICD-10-CM | POA: Diagnosis not present

## 2021-12-12 DIAGNOSIS — R2689 Other abnormalities of gait and mobility: Secondary | ICD-10-CM | POA: Diagnosis not present

## 2021-12-12 DIAGNOSIS — S92002A Unspecified fracture of left calcaneus, initial encounter for closed fracture: Secondary | ICD-10-CM | POA: Diagnosis not present

## 2021-12-12 DIAGNOSIS — M79671 Pain in right foot: Secondary | ICD-10-CM | POA: Diagnosis not present

## 2021-12-12 DIAGNOSIS — L03116 Cellulitis of left lower limb: Secondary | ICD-10-CM | POA: Diagnosis not present

## 2021-12-12 DIAGNOSIS — G8911 Acute pain due to trauma: Secondary | ICD-10-CM | POA: Diagnosis not present

## 2021-12-12 DIAGNOSIS — M6281 Muscle weakness (generalized): Secondary | ICD-10-CM | POA: Diagnosis not present

## 2021-12-13 DIAGNOSIS — R2689 Other abnormalities of gait and mobility: Secondary | ICD-10-CM | POA: Diagnosis not present

## 2021-12-13 DIAGNOSIS — R279 Unspecified lack of coordination: Secondary | ICD-10-CM | POA: Diagnosis not present

## 2021-12-13 DIAGNOSIS — L03116 Cellulitis of left lower limb: Secondary | ICD-10-CM | POA: Diagnosis not present

## 2021-12-13 DIAGNOSIS — M6281 Muscle weakness (generalized): Secondary | ICD-10-CM | POA: Diagnosis not present

## 2021-12-13 NOTE — Telephone Encounter (Signed)
Casey Parker  from [facility] [phone]  called about the patient lmphydem pumps and they are not able to order them please advise

## 2021-12-13 NOTE — Telephone Encounter (Signed)
This pt is in a skilled facility and pts are not able to get lymph pumps while in facility. They are asking what it is that you would like for them to do.

## 2021-12-14 DIAGNOSIS — R279 Unspecified lack of coordination: Secondary | ICD-10-CM | POA: Diagnosis not present

## 2021-12-14 DIAGNOSIS — L03116 Cellulitis of left lower limb: Secondary | ICD-10-CM | POA: Diagnosis not present

## 2021-12-14 DIAGNOSIS — R2689 Other abnormalities of gait and mobility: Secondary | ICD-10-CM | POA: Diagnosis not present

## 2021-12-14 DIAGNOSIS — M79671 Pain in right foot: Secondary | ICD-10-CM | POA: Diagnosis not present

## 2021-12-14 DIAGNOSIS — M6281 Muscle weakness (generalized): Secondary | ICD-10-CM | POA: Diagnosis not present

## 2021-12-14 DIAGNOSIS — S92002A Unspecified fracture of left calcaneus, initial encounter for closed fracture: Secondary | ICD-10-CM | POA: Diagnosis not present

## 2021-12-14 NOTE — Telephone Encounter (Signed)
Called and sw Sana to advise of message below. They will do this weekly and pt will f/u at appt  will call with any questions.

## 2021-12-15 ENCOUNTER — Encounter: Payer: Self-pay | Admitting: Orthopedic Surgery

## 2021-12-15 DIAGNOSIS — L03116 Cellulitis of left lower limb: Secondary | ICD-10-CM | POA: Diagnosis not present

## 2021-12-15 DIAGNOSIS — M6281 Muscle weakness (generalized): Secondary | ICD-10-CM | POA: Diagnosis not present

## 2021-12-15 DIAGNOSIS — R2689 Other abnormalities of gait and mobility: Secondary | ICD-10-CM | POA: Diagnosis not present

## 2021-12-15 DIAGNOSIS — R279 Unspecified lack of coordination: Secondary | ICD-10-CM | POA: Diagnosis not present

## 2021-12-15 NOTE — Progress Notes (Signed)
Office Visit Note   Patient: Casey Parker           Date of Birth: 1945-06-03           MRN: [id number] Visit Date: 12/08/2021              Requested by: Leandrew Koyanagi, MD [address] [place],  [place] [id number] PCP: Shon Baton, MD  Chief Complaint  Patient presents with   Right Leg - Edema   Left Leg - Edema      HPI: Patient is a 76 year old gentleman who was seen in referral from Dr. Erlinda Hong for lymphedema and venous insufficiency both lower extremities.  Patient has been followed for calcaneal fracture.  Patient currently resides at [facility] and has compression hose.  Assessment & Plan: Visit Diagnoses:  1. Chronic venous hypertension (idiopathic) with ulcer and inflammation of bilateral lower extremity (HCC)     Plan: Patient was provided an order that he can have the compression socks placed in the morning and the facility remove the socks in the evening.  Will proceed with approval for lymphedema pumps for the chronic lymphedema.  Follow-Up Instructions: Return in about 4 weeks (around 01/05/2022).   Ortho Exam  Patient is alert, oriented, no adenopathy, well-dressed, normal affect, normal respiratory effort. Examination patient has chronic venous and lymphatic insufficiency there is brawny edema in both legs without drainage or ulcers.  He has a palpable dorsalis pedis pulse bilaterally.  Patient has used compression socks for months without resolution.  Imaging: No results found. No images are attached to the encounter.  Labs: Lab Results  Component Value Date   REPTSTATUS 07/29/2021 FINAL 07/24/2021   CULT  07/24/2021    NO GROWTH 5 DAYS Performed at [facility], [place] [address] [place], [place] [address]      Lab Results  Component Value Date   ALBUMIN 2.1 (L) 07/24/2021   ALBUMIN 3.4 (L) 12/20/2008    Lab Results  Component Value Date   MG 2.0 07/25/2021   No results found for: "VD25OH"  No results found for: "PREALBUMIN"     Latest Ref Rng & Units 07/27/2021    1:36 AM 07/26/2021    2:22 AM 07/25/2021   12:44 AM  CBC EXTENDED  WBC 4.0 - 10.5 K/uL 6.9  6.5  6.2   RBC 4.22 - 5.81 MIL/uL 3.44  3.28  3.23   Hemoglobin 13.0 - 17.0 g/dL 9.7  9.3  9.1   HCT 39.0 - 52.0 % 31.7  30.0  29.0   Platelets 150 - 400 K/uL 288  300  281      There is no height or weight on file to calculate BMI.  Orders:  No orders of the defined types were placed in this encounter.  No orders of the defined types were placed in this encounter.    Procedures: No procedures performed  Clinical Data: No additional findings.  ROS:  All other systems negative, except as noted in the HPI. Review of Systems  Objective: Vital Signs: There were no vitals taken for this visit.  Specialty Comments:  No specialty comments available.  PMFS History: Patient Active Problem List   Diagnosis Date Noted   Pressure injury of skin 07/25/2021   Lower urinary tract symptoms 07/24/2021   Transaminitis 07/24/2021   Normocytic anemia 07/24/2021   Paroxysmal atrial fibrillation ([place]) 07/24/2021   Intertrigo 04/18/2021   Venous stasis dermatitis 03/15/2021   Cellulitis and abscess of  foot, except toes 09/07/2020   Skin maceration 09/07/2020   Chronic knee pain after total replacement of left knee joint 04/12/2020   Pain in left elbow 01/01/2020   Unilateral primary osteoarthritis, left knee 09/01/2019   Unilateral primary osteoarthritis, right knee 09/01/2019   Obesity, Class III, BMI 40-49.9 (morbid obesity) ([place]) 05/03/2016   Hypertension 05/03/2016   Hypothyroid 05/03/2016   Hyperlipidemia 05/03/2016   Osteoarthritis of right hip 08/21/2014   Status post total replacement of right hip 08/21/2014   Osteoarthritis    Migraines    Cellulitis in setting of venous stasis dermatitis and lymphedema     hx of TIA    Left ventricular hypertrophy    Past Medical History:  Diagnosis Date   Cellulitis    recurrent   Diastolic dysfunction     DVT (deep venous thrombosis) ([place])    20 yrs ago   Enlarged prostate    Hyperlipidemia    Hypertension    Intertrigo 04/18/2021   Left ventricular hypertrophy    Migraines    H/O   Obesity    Osteoarthritis    Stroke ([place])    11-2008 no residual problems   Thyroid disease    hypo   Transient ischemic attack (TIA)    presumed   Venous stasis dermatitis 03/15/2021    Family History  Problem Relation Age of Onset   Hypertension Father     Past Surgical History:  Procedure Laterality Date   HAND SURGERY     right   HERNIA REPAIR     TONSILLECTOMY     TOTAL HIP ARTHROPLASTY Right 08/21/2014   Procedure: RIGHT TOTAL HIP ARTHROPLASTY ANTERIOR APPROACH;  Surgeon: Mcarthur Rossetti, MD;  Location: [facility];  Service: Orthopedics;  Laterality: Right;   Social History   Occupational History   Not on file  Tobacco Use   Smoking status: Former    Types: Cigarettes    Quit date: 01/31/1988    Years since quitting: 33.8   Smokeless tobacco: Never  Substance and Sexual Activity   Alcohol use: Yes    Comment: on social occassions   Drug use: No   Sexual activity: Not on file

## 2021-12-16 DIAGNOSIS — F4323 Adjustment disorder with mixed anxiety and depressed mood: Secondary | ICD-10-CM | POA: Diagnosis not present

## 2021-12-16 DIAGNOSIS — L03116 Cellulitis of left lower limb: Secondary | ICD-10-CM | POA: Diagnosis not present

## 2021-12-16 DIAGNOSIS — R279 Unspecified lack of coordination: Secondary | ICD-10-CM | POA: Diagnosis not present

## 2021-12-16 DIAGNOSIS — M6281 Muscle weakness (generalized): Secondary | ICD-10-CM | POA: Diagnosis not present

## 2021-12-16 DIAGNOSIS — R2689 Other abnormalities of gait and mobility: Secondary | ICD-10-CM | POA: Diagnosis not present

## 2021-12-19 DIAGNOSIS — L03116 Cellulitis of left lower limb: Secondary | ICD-10-CM | POA: Diagnosis not present

## 2021-12-19 DIAGNOSIS — R279 Unspecified lack of coordination: Secondary | ICD-10-CM | POA: Diagnosis not present

## 2021-12-19 DIAGNOSIS — R2689 Other abnormalities of gait and mobility: Secondary | ICD-10-CM | POA: Diagnosis not present

## 2021-12-19 DIAGNOSIS — M6281 Muscle weakness (generalized): Secondary | ICD-10-CM | POA: Diagnosis not present

## 2021-12-20 DIAGNOSIS — L03116 Cellulitis of left lower limb: Secondary | ICD-10-CM | POA: Diagnosis not present

## 2021-12-20 DIAGNOSIS — R2689 Other abnormalities of gait and mobility: Secondary | ICD-10-CM | POA: Diagnosis not present

## 2021-12-20 DIAGNOSIS — F4323 Adjustment disorder with mixed anxiety and depressed mood: Secondary | ICD-10-CM | POA: Diagnosis not present

## 2021-12-20 DIAGNOSIS — R279 Unspecified lack of coordination: Secondary | ICD-10-CM | POA: Diagnosis not present

## 2021-12-20 DIAGNOSIS — M6281 Muscle weakness (generalized): Secondary | ICD-10-CM | POA: Diagnosis not present

## 2021-12-30 ENCOUNTER — Ambulatory Visit (INDEPENDENT_AMBULATORY_CARE_PROVIDER_SITE_OTHER): Payer: Medicare Other

## 2021-12-30 ENCOUNTER — Ambulatory Visit: Payer: Self-pay

## 2021-12-30 ENCOUNTER — Ambulatory Visit (INDEPENDENT_AMBULATORY_CARE_PROVIDER_SITE_OTHER): Payer: Medicare Other | Admitting: Orthopaedic Surgery

## 2021-12-30 DIAGNOSIS — M79672 Pain in left foot: Secondary | ICD-10-CM

## 2021-12-30 DIAGNOSIS — F4323 Adjustment disorder with mixed anxiety and depressed mood: Secondary | ICD-10-CM | POA: Diagnosis not present

## 2021-12-30 DIAGNOSIS — M79671 Pain in right foot: Secondary | ICD-10-CM

## 2021-12-30 NOTE — Progress Notes (Unsigned)
Office Visit Note   Patient: Casey Parker           Date of Birth: 03/19/1945           MRN: [id number] Visit Date: 12/30/2021              Requested by: Shon Baton, [place],  [place] [address] PCP: Shon Baton, MD   Assessment & Plan: Visit Diagnoses:  1. Left foot pain     Plan: Impression is right heel pain concerning for occult fracture and 12 weeks status post left calcaneus fracture.  At this point, we will go ahead and order a CT scan of the right ankle to assess for structural abnormalities.  He will remain nonweightbearing in the meantime.  Follow-up once the scan has been completed.  In regards to the left heel, he will continue to bear weight as tolerated.  Call with concerns or questions in the meantime.  Total face to face encounter time was greater than 25 minutes and over half of this time was spent in counseling and/or coordination of care.  Follow-Up Instructions: Return for f/u after CT scan.   Orders:  Orders Placed This Encounter  Procedures   XR Os Calcis Left   XR Os Calcis Right   CT ANKLE RIGHT WO CONTRAST   No orders of the defined types were placed in this encounter.     Procedures: No procedures performed   Clinical Data: No additional findings.   Subjective: Chief Complaint  Patient presents with   Left Foot - Pain   Right Foot - Pain    HPI patient is a pleasant 76 year old gentleman who comes in today with right heel pain for the past 4 weeks.  Symptoms started when he started working with therapy following his left heel injury.  No specific injury.  The pain feels very similar to that of the left heel following the calcaneus fracture.  Symptoms appear to be constant but worse with bearing weight.  In regards to the left heel, he is approximately 12 weeks out calcaneus fracture.  He is doing okay there.  Of note, he is primarily nonambulatory and uses a [employer] fast streamer.  Review of Systems as detailed in HPI.  All others  reviewed and are negative.   Objective: Vital Signs: There were no vitals taken for this visit.  Physical Exam well-developed well-nourished gentleman in no acute distress.  Alert and oriented x3.  Ortho Exam right foot exam: Moderate tenderness to the calcaneus with a positive squeeze test.  Limited range of motion secondary to weakness.  Unchanged left foot exam  Specialty Comments:  No specialty comments available.  Imaging: XR Os Calcis Right  Result Date: 12/30/2021 X-rays demonstrate disuse osteopenia throughout.  No acute fracture noted.    PMFS History: Patient Active Problem List   Diagnosis Date Noted   Pressure injury of skin 07/25/2021   Lower urinary tract symptoms 07/24/2021   Transaminitis 07/24/2021   Normocytic anemia 07/24/2021   Paroxysmal atrial fibrillation ([place]) 07/24/2021   Intertrigo 04/18/2021   Venous stasis dermatitis 03/15/2021   Cellulitis and abscess of foot, except toes 09/07/2020   Skin maceration 09/07/2020   Chronic knee pain after total replacement of left knee joint 04/12/2020   Pain in left elbow 01/01/2020   Unilateral primary osteoarthritis, left knee 09/01/2019   Unilateral primary osteoarthritis, right knee 09/01/2019   Obesity, Class III, BMI 40-49.9 (morbid obesity) ([place]) 05/03/2016   Hypertension 05/03/2016  Hypothyroid 05/03/2016   Hyperlipidemia 05/03/2016   Osteoarthritis of right hip 08/21/2014   Status post total replacement of right hip 08/21/2014   Osteoarthritis    Migraines    Cellulitis in setting of venous stasis dermatitis and lymphedema     hx of TIA    Left ventricular hypertrophy    Past Medical History:  Diagnosis Date   Cellulitis    recurrent   Diastolic dysfunction    DVT (deep venous thrombosis) ([place])    20 yrs ago   Enlarged prostate    Hyperlipidemia    Hypertension    Intertrigo 04/18/2021   Left ventricular hypertrophy    Migraines    H/O   Obesity    Osteoarthritis    Stroke ([place])     11-2008 no residual problems   Thyroid disease    hypo   Transient ischemic attack (TIA)    presumed   Venous stasis dermatitis 03/15/2021    Family History  Problem Relation Age of Onset   Hypertension Father     Past Surgical History:  Procedure Laterality Date   HAND SURGERY     right   HERNIA REPAIR     TONSILLECTOMY     TOTAL HIP ARTHROPLASTY Right 08/21/2014   Procedure: RIGHT TOTAL HIP ARTHROPLASTY ANTERIOR APPROACH;  Surgeon: Mcarthur Rossetti, MD;  Location: [facility];  Service: Orthopedics;  Laterality: Right;   Social History   Occupational History   Not on file  Tobacco Use   Smoking status: Former    Types: Cigarettes    Quit date: 01/31/1988    Years since quitting: 33.9   Smokeless tobacco: Never  Substance and Sexual Activity   Alcohol use: Yes    Comment: on social occassions   Drug use: No   Sexual activity: Not on file

## 2022-01-04 DIAGNOSIS — F4323 Adjustment disorder with mixed anxiety and depressed mood: Secondary | ICD-10-CM | POA: Diagnosis not present

## 2022-01-05 ENCOUNTER — Ambulatory Visit: Payer: Medicare Other | Admitting: Orthopedic Surgery

## 2022-01-06 ENCOUNTER — Ambulatory Visit (HOSPITAL_COMMUNITY)
Admission: RE | Admit: 2022-01-06 | Discharge: 2022-01-06 | Disposition: A | Discharge status: Home / Self Care | Payer: Medicare Other | Source: Ambulatory Visit | Attending: Physician Assistant | Admitting: Physician Assistant

## 2022-01-06 ENCOUNTER — Ambulatory Visit: Payer: Medicare Other | Admitting: Orthopaedic Surgery

## 2022-01-06 DIAGNOSIS — M79672 Pain in left foot: Secondary | ICD-10-CM | POA: Diagnosis not present

## 2022-01-06 DIAGNOSIS — S92002A Unspecified fracture of left calcaneus, initial encounter for closed fracture: Secondary | ICD-10-CM | POA: Diagnosis not present

## 2022-01-06 DIAGNOSIS — M79671 Pain in right foot: Secondary | ICD-10-CM | POA: Diagnosis not present

## 2022-01-08 NOTE — Progress Notes (Signed)
I'm sorry, right footg

## 2022-01-08 NOTE — Progress Notes (Signed)
Can you call patient and let him know that he does not have a fracture to the left foot

## 2022-01-10 ENCOUNTER — Ambulatory Visit (INDEPENDENT_AMBULATORY_CARE_PROVIDER_SITE_OTHER): Payer: Medicare Other | Admitting: Family

## 2022-01-10 DIAGNOSIS — M722 Plantar fascial fibromatosis: Secondary | ICD-10-CM | POA: Diagnosis not present

## 2022-01-10 DIAGNOSIS — M6701 Short Achilles tendon (acquired), right ankle: Secondary | ICD-10-CM | POA: Diagnosis not present

## 2022-01-11 ENCOUNTER — Encounter: Payer: Self-pay | Admitting: Family

## 2022-01-11 DIAGNOSIS — F4323 Adjustment disorder with mixed anxiety and depressed mood: Secondary | ICD-10-CM | POA: Diagnosis not present

## 2022-01-11 NOTE — Progress Notes (Signed)
Office Visit Note   Patient: Casey Parker           Date of Birth: 1945/04/07           MRN: [id number] Visit Date: 01/10/2022              Requested by: Shon Baton, MD [address] [place],  [place] [address] PCP: Shon Baton, MD  Chief Complaint  Patient presents with   Right Ankle - Follow-up    CT scan review       HPI: The patient is a 76 year old gentleman who is seen today for a CT scan review.  He was sent for right ankle CT due to some new heel pain by Dr. Erlinda Hong. This was negative  Patient complaining of heel pain pain that radiates and extends underneath the plantar aspect of his right foot radiates up his heel cord this is worse with dorsiflexion.  He notes that he was bedbound for many months due to an injury to his left lower extremity during that time it seems that the patient developed an equinus contracture on the right.  Once he began getting up for physical therapy noted the heel pain  Assessment & Plan: Visit Diagnoses: No diagnosis found.  Plan: Discussed heel cord stretching.  Have ordered physical therapy for the patient return demonstration of heel cord stretching with a towel Spoke with his son by phone.  Follow-Up Instructions: No follow-ups on file.   Right Ankle Exam  Right ankle exam is normal.       Patient is alert, oriented, no adenopathy, well-dressed, normal affect, normal respiratory effort. On examination of the right foot he does have dorsiflexion shy of neutral with tenderness along the course of the Achilles pain with dorsiflexion.  He does have pain to the plantar fascia especially the origin.  Imaging: No results found. No images are attached to the encounter.  Labs: Lab Results  Component Value Date   REPTSTATUS 07/29/2021 FINAL 07/24/2021   CULT  07/24/2021    NO GROWTH 5 DAYS Performed at [facility], [place] [address] [place], [place] [address]      Lab Results  Component Value Date   ALBUMIN 2.1 (L)  07/24/2021   ALBUMIN 3.4 (L) 12/20/2008    Lab Results  Component Value Date   MG 2.0 07/25/2021   No results found for: "VD25OH"  No results found for: "PREALBUMIN"    Latest Ref Rng & Units 07/27/2021    1:36 AM 07/26/2021    2:22 AM 07/25/2021   12:44 AM  CBC EXTENDED  WBC 4.0 - 10.5 K/uL 6.9  6.5  6.2   RBC 4.22 - 5.81 MIL/uL 3.44  3.28  3.23   Hemoglobin 13.0 - 17.0 g/dL 9.7  9.3  9.1   HCT 39.0 - 52.0 % 31.7  30.0  29.0   Platelets 150 - 400 K/uL 288  300  281      There is no height or weight on file to calculate BMI.  Orders:  No orders of the defined types were placed in this encounter.  No orders of the defined types were placed in this encounter.    Procedures: No procedures performed  Clinical Data: No additional findings.  ROS:  All other systems negative, except as noted in the HPI. Review of Systems  Objective: Vital Signs: There were no vitals taken for this visit.  Specialty Comments:  No specialty comments available.  PMFS History: Patient Active  Problem List   Diagnosis Date Noted   Pressure injury of skin 07/25/2021   Lower urinary tract symptoms 07/24/2021   Transaminitis 07/24/2021   Normocytic anemia 07/24/2021   Paroxysmal atrial fibrillation ([place]) 07/24/2021   Intertrigo 04/18/2021   Venous stasis dermatitis 03/15/2021   Cellulitis and abscess of foot, except toes 09/07/2020   Skin maceration 09/07/2020   Chronic knee pain after total replacement of left knee joint 04/12/2020   Pain in left elbow 01/01/2020   Unilateral primary osteoarthritis, left knee 09/01/2019   Unilateral primary osteoarthritis, right knee 09/01/2019   Obesity, Class III, BMI 40-49.9 (morbid obesity) ([place]) 05/03/2016   Hypertension 05/03/2016   Hypothyroid 05/03/2016   Hyperlipidemia 05/03/2016   Osteoarthritis of right hip 08/21/2014   Status post total replacement of right hip 08/21/2014   Osteoarthritis    Migraines    Cellulitis in setting of  venous stasis dermatitis and lymphedema     hx of TIA    Left ventricular hypertrophy    Past Medical History:  Diagnosis Date   Cellulitis    recurrent   Diastolic dysfunction    DVT (deep venous thrombosis) ([place])    20 yrs ago   Enlarged prostate    Hyperlipidemia    Hypertension    Intertrigo 04/18/2021   Left ventricular hypertrophy    Migraines    H/O   Obesity    Osteoarthritis    Stroke ([place])    11-2008 no residual problems   Thyroid disease    hypo   Transient ischemic attack (TIA)    presumed   Venous stasis dermatitis 03/15/2021    Family History  Problem Relation Age of Onset   Hypertension Father     Past Surgical History:  Procedure Laterality Date   HAND SURGERY     right   HERNIA REPAIR     TONSILLECTOMY     TOTAL HIP ARTHROPLASTY Right 08/21/2014   Procedure: RIGHT TOTAL HIP ARTHROPLASTY ANTERIOR APPROACH;  Surgeon: Mcarthur Rossetti, MD;  Location: [facility];  Service: Orthopedics;  Laterality: Right;   Social History   Occupational History   Not on file  Tobacco Use   Smoking status: Former    Types: Cigarettes    Quit date: 01/31/1988    Years since quitting: 33.9   Smokeless tobacco: Never  Substance and Sexual Activity   Alcohol use: Yes    Comment: on social occassions   Drug use: No   Sexual activity: Not on file

## 2022-01-12 ENCOUNTER — Ambulatory Visit (HOSPITAL_COMMUNITY): Payer: No Typology Code available for payment source

## 2022-01-16 DIAGNOSIS — B372 Candidiasis of skin and nail: Secondary | ICD-10-CM | POA: Diagnosis not present

## 2022-01-16 DIAGNOSIS — S92002A Unspecified fracture of left calcaneus, initial encounter for closed fracture: Secondary | ICD-10-CM | POA: Diagnosis not present

## 2022-01-16 DIAGNOSIS — M79671 Pain in right foot: Secondary | ICD-10-CM | POA: Diagnosis not present

## 2022-01-16 DIAGNOSIS — E039 Hypothyroidism, unspecified: Secondary | ICD-10-CM | POA: Diagnosis not present

## 2022-01-16 DIAGNOSIS — E559 Vitamin D deficiency, unspecified: Secondary | ICD-10-CM | POA: Diagnosis not present

## 2022-01-16 DIAGNOSIS — E785 Hyperlipidemia, unspecified: Secondary | ICD-10-CM | POA: Diagnosis not present

## 2022-01-16 DIAGNOSIS — N400 Enlarged prostate without lower urinary tract symptoms: Secondary | ICD-10-CM | POA: Diagnosis not present

## 2022-01-16 DIAGNOSIS — I4891 Unspecified atrial fibrillation: Secondary | ICD-10-CM | POA: Diagnosis not present

## 2022-01-20 DIAGNOSIS — I119 Hypertensive heart disease without heart failure: Secondary | ICD-10-CM | POA: Diagnosis not present

## 2022-01-20 DIAGNOSIS — I480 Paroxysmal atrial fibrillation: Secondary | ICD-10-CM | POA: Diagnosis not present

## 2022-01-20 DIAGNOSIS — I739 Peripheral vascular disease, unspecified: Secondary | ICD-10-CM | POA: Diagnosis not present

## 2022-01-20 DIAGNOSIS — R1310 Dysphagia, unspecified: Secondary | ICD-10-CM | POA: Diagnosis not present

## 2022-01-20 DIAGNOSIS — M6281 Muscle weakness (generalized): Secondary | ICD-10-CM | POA: Diagnosis not present

## 2022-01-20 DIAGNOSIS — I639 Cerebral infarction, unspecified: Secondary | ICD-10-CM | POA: Diagnosis not present

## 2022-01-20 DIAGNOSIS — E039 Hypothyroidism, unspecified: Secondary | ICD-10-CM | POA: Diagnosis not present

## 2022-01-20 DIAGNOSIS — N400 Enlarged prostate without lower urinary tract symptoms: Secondary | ICD-10-CM | POA: Diagnosis not present

## 2022-01-20 DIAGNOSIS — D640 Hereditary sideroblastic anemia: Secondary | ICD-10-CM | POA: Diagnosis not present

## 2022-01-20 DIAGNOSIS — R4189 Other symptoms and signs involving cognitive functions and awareness: Secondary | ICD-10-CM | POA: Diagnosis not present

## 2022-01-20 DIAGNOSIS — R54 Age-related physical debility: Secondary | ICD-10-CM | POA: Diagnosis not present

## 2022-01-20 DIAGNOSIS — E785 Hyperlipidemia, unspecified: Secondary | ICD-10-CM | POA: Diagnosis not present

## 2022-01-25 DIAGNOSIS — L03116 Cellulitis of left lower limb: Secondary | ICD-10-CM | POA: Diagnosis not present

## 2022-01-25 DIAGNOSIS — R262 Difficulty in walking, not elsewhere classified: Secondary | ICD-10-CM | POA: Diagnosis not present

## 2022-01-25 DIAGNOSIS — M6281 Muscle weakness (generalized): Secondary | ICD-10-CM | POA: Diagnosis not present

## 2022-01-25 DIAGNOSIS — I480 Paroxysmal atrial fibrillation: Secondary | ICD-10-CM | POA: Diagnosis not present

## 2022-01-25 DIAGNOSIS — R279 Unspecified lack of coordination: Secondary | ICD-10-CM | POA: Diagnosis not present

## 2022-01-26 DIAGNOSIS — M6281 Muscle weakness (generalized): Secondary | ICD-10-CM | POA: Diagnosis not present

## 2022-01-26 DIAGNOSIS — R279 Unspecified lack of coordination: Secondary | ICD-10-CM | POA: Diagnosis not present

## 2022-01-26 DIAGNOSIS — L03116 Cellulitis of left lower limb: Secondary | ICD-10-CM | POA: Diagnosis not present

## 2022-01-26 DIAGNOSIS — I480 Paroxysmal atrial fibrillation: Secondary | ICD-10-CM | POA: Diagnosis not present

## 2022-01-26 DIAGNOSIS — R262 Difficulty in walking, not elsewhere classified: Secondary | ICD-10-CM | POA: Diagnosis not present

## 2022-01-31 DIAGNOSIS — R262 Difficulty in walking, not elsewhere classified: Secondary | ICD-10-CM | POA: Diagnosis not present

## 2022-01-31 DIAGNOSIS — L03116 Cellulitis of left lower limb: Secondary | ICD-10-CM | POA: Diagnosis not present

## 2022-01-31 DIAGNOSIS — R279 Unspecified lack of coordination: Secondary | ICD-10-CM | POA: Diagnosis not present

## 2022-01-31 DIAGNOSIS — M6281 Muscle weakness (generalized): Secondary | ICD-10-CM | POA: Diagnosis not present

## 2022-01-31 DIAGNOSIS — I480 Paroxysmal atrial fibrillation: Secondary | ICD-10-CM | POA: Diagnosis not present

## 2022-02-02 DIAGNOSIS — M6281 Muscle weakness (generalized): Secondary | ICD-10-CM | POA: Diagnosis not present

## 2022-02-02 DIAGNOSIS — R279 Unspecified lack of coordination: Secondary | ICD-10-CM | POA: Diagnosis not present

## 2022-02-02 DIAGNOSIS — L03116 Cellulitis of left lower limb: Secondary | ICD-10-CM | POA: Diagnosis not present

## 2022-02-02 DIAGNOSIS — R262 Difficulty in walking, not elsewhere classified: Secondary | ICD-10-CM | POA: Diagnosis not present

## 2022-02-02 DIAGNOSIS — I480 Paroxysmal atrial fibrillation: Secondary | ICD-10-CM | POA: Diagnosis not present

## 2022-02-03 DIAGNOSIS — I480 Paroxysmal atrial fibrillation: Secondary | ICD-10-CM | POA: Diagnosis not present

## 2022-02-03 DIAGNOSIS — R262 Difficulty in walking, not elsewhere classified: Secondary | ICD-10-CM | POA: Diagnosis not present

## 2022-02-03 DIAGNOSIS — L03116 Cellulitis of left lower limb: Secondary | ICD-10-CM | POA: Diagnosis not present

## 2022-02-03 DIAGNOSIS — M6281 Muscle weakness (generalized): Secondary | ICD-10-CM | POA: Diagnosis not present

## 2022-02-03 DIAGNOSIS — R279 Unspecified lack of coordination: Secondary | ICD-10-CM | POA: Diagnosis not present

## 2022-02-07 DIAGNOSIS — M6281 Muscle weakness (generalized): Secondary | ICD-10-CM | POA: Diagnosis not present

## 2022-02-07 DIAGNOSIS — I480 Paroxysmal atrial fibrillation: Secondary | ICD-10-CM | POA: Diagnosis not present

## 2022-02-07 DIAGNOSIS — L03116 Cellulitis of left lower limb: Secondary | ICD-10-CM | POA: Diagnosis not present

## 2022-02-07 DIAGNOSIS — R262 Difficulty in walking, not elsewhere classified: Secondary | ICD-10-CM | POA: Diagnosis not present

## 2022-02-07 DIAGNOSIS — R279 Unspecified lack of coordination: Secondary | ICD-10-CM | POA: Diagnosis not present

## 2022-02-09 DIAGNOSIS — R262 Difficulty in walking, not elsewhere classified: Secondary | ICD-10-CM | POA: Diagnosis not present

## 2022-02-09 DIAGNOSIS — I480 Paroxysmal atrial fibrillation: Secondary | ICD-10-CM | POA: Diagnosis not present

## 2022-02-09 DIAGNOSIS — M6281 Muscle weakness (generalized): Secondary | ICD-10-CM | POA: Diagnosis not present

## 2022-02-09 DIAGNOSIS — L03116 Cellulitis of left lower limb: Secondary | ICD-10-CM | POA: Diagnosis not present

## 2022-02-09 DIAGNOSIS — R279 Unspecified lack of coordination: Secondary | ICD-10-CM | POA: Diagnosis not present

## 2022-02-10 DIAGNOSIS — I480 Paroxysmal atrial fibrillation: Secondary | ICD-10-CM | POA: Diagnosis not present

## 2022-02-10 DIAGNOSIS — M6281 Muscle weakness (generalized): Secondary | ICD-10-CM | POA: Diagnosis not present

## 2022-02-10 DIAGNOSIS — L03116 Cellulitis of left lower limb: Secondary | ICD-10-CM | POA: Diagnosis not present

## 2022-02-10 DIAGNOSIS — R262 Difficulty in walking, not elsewhere classified: Secondary | ICD-10-CM | POA: Diagnosis not present

## 2022-02-10 DIAGNOSIS — R279 Unspecified lack of coordination: Secondary | ICD-10-CM | POA: Diagnosis not present

## 2022-02-14 DIAGNOSIS — L03116 Cellulitis of left lower limb: Secondary | ICD-10-CM | POA: Diagnosis not present

## 2022-02-14 DIAGNOSIS — R262 Difficulty in walking, not elsewhere classified: Secondary | ICD-10-CM | POA: Diagnosis not present

## 2022-02-14 DIAGNOSIS — M6281 Muscle weakness (generalized): Secondary | ICD-10-CM | POA: Diagnosis not present

## 2022-02-14 DIAGNOSIS — R279 Unspecified lack of coordination: Secondary | ICD-10-CM | POA: Diagnosis not present

## 2022-02-14 DIAGNOSIS — I480 Paroxysmal atrial fibrillation: Secondary | ICD-10-CM | POA: Diagnosis not present

## 2022-02-16 DIAGNOSIS — L03116 Cellulitis of left lower limb: Secondary | ICD-10-CM | POA: Diagnosis not present

## 2022-02-16 DIAGNOSIS — M6281 Muscle weakness (generalized): Secondary | ICD-10-CM | POA: Diagnosis not present

## 2022-02-16 DIAGNOSIS — I480 Paroxysmal atrial fibrillation: Secondary | ICD-10-CM | POA: Diagnosis not present

## 2022-02-16 DIAGNOSIS — R262 Difficulty in walking, not elsewhere classified: Secondary | ICD-10-CM | POA: Diagnosis not present

## 2022-02-16 DIAGNOSIS — R279 Unspecified lack of coordination: Secondary | ICD-10-CM | POA: Diagnosis not present

## 2022-02-17 DIAGNOSIS — I480 Paroxysmal atrial fibrillation: Secondary | ICD-10-CM | POA: Diagnosis not present

## 2022-02-17 DIAGNOSIS — M6281 Muscle weakness (generalized): Secondary | ICD-10-CM | POA: Diagnosis not present

## 2022-02-17 DIAGNOSIS — L03116 Cellulitis of left lower limb: Secondary | ICD-10-CM | POA: Diagnosis not present

## 2022-02-17 DIAGNOSIS — R262 Difficulty in walking, not elsewhere classified: Secondary | ICD-10-CM | POA: Diagnosis not present

## 2022-02-17 DIAGNOSIS — R279 Unspecified lack of coordination: Secondary | ICD-10-CM | POA: Diagnosis not present

## 2022-02-21 DIAGNOSIS — I480 Paroxysmal atrial fibrillation: Secondary | ICD-10-CM | POA: Diagnosis not present

## 2022-02-21 DIAGNOSIS — R262 Difficulty in walking, not elsewhere classified: Secondary | ICD-10-CM | POA: Diagnosis not present

## 2022-02-21 DIAGNOSIS — R279 Unspecified lack of coordination: Secondary | ICD-10-CM | POA: Diagnosis not present

## 2022-02-21 DIAGNOSIS — L03116 Cellulitis of left lower limb: Secondary | ICD-10-CM | POA: Diagnosis not present

## 2022-02-21 DIAGNOSIS — M6281 Muscle weakness (generalized): Secondary | ICD-10-CM | POA: Diagnosis not present

## 2022-02-23 DIAGNOSIS — R279 Unspecified lack of coordination: Secondary | ICD-10-CM | POA: Diagnosis not present

## 2022-02-23 DIAGNOSIS — M6281 Muscle weakness (generalized): Secondary | ICD-10-CM | POA: Diagnosis not present

## 2022-02-23 DIAGNOSIS — R262 Difficulty in walking, not elsewhere classified: Secondary | ICD-10-CM | POA: Diagnosis not present

## 2022-02-23 DIAGNOSIS — L03116 Cellulitis of left lower limb: Secondary | ICD-10-CM | POA: Diagnosis not present

## 2022-02-23 DIAGNOSIS — I480 Paroxysmal atrial fibrillation: Secondary | ICD-10-CM | POA: Diagnosis not present

## 2022-02-24 DIAGNOSIS — R279 Unspecified lack of coordination: Secondary | ICD-10-CM | POA: Diagnosis not present

## 2022-02-24 DIAGNOSIS — I480 Paroxysmal atrial fibrillation: Secondary | ICD-10-CM | POA: Diagnosis not present

## 2022-02-24 DIAGNOSIS — R262 Difficulty in walking, not elsewhere classified: Secondary | ICD-10-CM | POA: Diagnosis not present

## 2022-02-24 DIAGNOSIS — M25562 Pain in left knee: Secondary | ICD-10-CM | POA: Diagnosis not present

## 2022-02-24 DIAGNOSIS — L03116 Cellulitis of left lower limb: Secondary | ICD-10-CM | POA: Diagnosis not present

## 2022-02-24 DIAGNOSIS — M170 Bilateral primary osteoarthritis of knee: Secondary | ICD-10-CM | POA: Diagnosis not present

## 2022-02-24 DIAGNOSIS — M6281 Muscle weakness (generalized): Secondary | ICD-10-CM | POA: Diagnosis not present

## 2022-02-24 DIAGNOSIS — M25561 Pain in right knee: Secondary | ICD-10-CM | POA: Diagnosis not present

## 2022-03-02 DIAGNOSIS — B372 Candidiasis of skin and nail: Secondary | ICD-10-CM | POA: Diagnosis not present

## 2022-03-02 DIAGNOSIS — I890 Lymphedema, not elsewhere classified: Secondary | ICD-10-CM | POA: Diagnosis not present

## 2022-03-02 DIAGNOSIS — L309 Dermatitis, unspecified: Secondary | ICD-10-CM | POA: Diagnosis not present

## 2022-03-02 DIAGNOSIS — M179 Osteoarthritis of knee, unspecified: Secondary | ICD-10-CM | POA: Diagnosis not present

## 2022-03-09 ENCOUNTER — Encounter (HOSPITAL_COMMUNITY): Payer: Self-pay | Admitting: *Deleted

## 2022-03-14 DIAGNOSIS — M6281 Muscle weakness (generalized): Secondary | ICD-10-CM | POA: Diagnosis not present

## 2022-03-14 DIAGNOSIS — I480 Paroxysmal atrial fibrillation: Secondary | ICD-10-CM | POA: Diagnosis not present

## 2022-03-14 DIAGNOSIS — M25562 Pain in left knee: Secondary | ICD-10-CM | POA: Diagnosis not present

## 2022-03-14 DIAGNOSIS — L03116 Cellulitis of left lower limb: Secondary | ICD-10-CM | POA: Diagnosis not present

## 2022-03-14 DIAGNOSIS — M170 Bilateral primary osteoarthritis of knee: Secondary | ICD-10-CM | POA: Diagnosis not present

## 2022-03-14 DIAGNOSIS — M25561 Pain in right knee: Secondary | ICD-10-CM | POA: Diagnosis not present

## 2022-03-14 DIAGNOSIS — R279 Unspecified lack of coordination: Secondary | ICD-10-CM | POA: Diagnosis not present

## 2022-03-15 DIAGNOSIS — G3184 Mild cognitive impairment, so stated: Secondary | ICD-10-CM | POA: Diagnosis not present

## 2022-03-15 DIAGNOSIS — F4320 Adjustment disorder, unspecified: Secondary | ICD-10-CM | POA: Diagnosis not present

## 2022-03-16 DIAGNOSIS — L03116 Cellulitis of left lower limb: Secondary | ICD-10-CM | POA: Diagnosis not present

## 2022-03-16 DIAGNOSIS — R279 Unspecified lack of coordination: Secondary | ICD-10-CM | POA: Diagnosis not present

## 2022-03-16 DIAGNOSIS — M6281 Muscle weakness (generalized): Secondary | ICD-10-CM | POA: Diagnosis not present

## 2022-03-16 DIAGNOSIS — I480 Paroxysmal atrial fibrillation: Secondary | ICD-10-CM | POA: Diagnosis not present

## 2022-03-22 DIAGNOSIS — E781 Pure hyperglyceridemia: Secondary | ICD-10-CM | POA: Diagnosis not present

## 2022-03-22 DIAGNOSIS — I739 Peripheral vascular disease, unspecified: Secondary | ICD-10-CM | POA: Diagnosis not present

## 2022-03-24 DIAGNOSIS — M6281 Muscle weakness (generalized): Secondary | ICD-10-CM | POA: Diagnosis not present

## 2022-03-24 DIAGNOSIS — R279 Unspecified lack of coordination: Secondary | ICD-10-CM | POA: Diagnosis not present

## 2022-03-24 DIAGNOSIS — I480 Paroxysmal atrial fibrillation: Secondary | ICD-10-CM | POA: Diagnosis not present

## 2022-03-24 DIAGNOSIS — L03116 Cellulitis of left lower limb: Secondary | ICD-10-CM | POA: Diagnosis not present

## 2022-03-29 DIAGNOSIS — R279 Unspecified lack of coordination: Secondary | ICD-10-CM | POA: Diagnosis not present

## 2022-03-29 DIAGNOSIS — L03116 Cellulitis of left lower limb: Secondary | ICD-10-CM | POA: Diagnosis not present

## 2022-03-29 DIAGNOSIS — M6281 Muscle weakness (generalized): Secondary | ICD-10-CM | POA: Diagnosis not present

## 2022-03-29 DIAGNOSIS — I480 Paroxysmal atrial fibrillation: Secondary | ICD-10-CM | POA: Diagnosis not present

## 2022-03-30 NOTE — Telephone Encounter (Signed)
Error

## 2022-03-31 DIAGNOSIS — L03116 Cellulitis of left lower limb: Secondary | ICD-10-CM | POA: Diagnosis not present

## 2022-03-31 DIAGNOSIS — I480 Paroxysmal atrial fibrillation: Secondary | ICD-10-CM | POA: Diagnosis not present

## 2022-03-31 DIAGNOSIS — M6281 Muscle weakness (generalized): Secondary | ICD-10-CM | POA: Diagnosis not present

## 2022-03-31 DIAGNOSIS — R279 Unspecified lack of coordination: Secondary | ICD-10-CM | POA: Diagnosis not present

## 2022-04-03 DIAGNOSIS — I890 Lymphedema, not elsewhere classified: Secondary | ICD-10-CM | POA: Diagnosis not present

## 2022-04-03 DIAGNOSIS — M25572 Pain in left ankle and joints of left foot: Secondary | ICD-10-CM | POA: Diagnosis not present

## 2022-04-03 DIAGNOSIS — R21 Rash and other nonspecific skin eruption: Secondary | ICD-10-CM | POA: Diagnosis not present

## 2022-04-03 DIAGNOSIS — E46 Unspecified protein-calorie malnutrition: Secondary | ICD-10-CM | POA: Diagnosis not present

## 2022-04-03 DIAGNOSIS — M25571 Pain in right ankle and joints of right foot: Secondary | ICD-10-CM | POA: Diagnosis not present

## 2022-04-03 DIAGNOSIS — H9190 Unspecified hearing loss, unspecified ear: Secondary | ICD-10-CM | POA: Diagnosis not present

## 2022-04-04 DIAGNOSIS — G8911 Acute pain due to trauma: Secondary | ICD-10-CM | POA: Diagnosis not present

## 2022-04-05 DIAGNOSIS — I480 Paroxysmal atrial fibrillation: Secondary | ICD-10-CM | POA: Diagnosis not present

## 2022-04-05 DIAGNOSIS — L03116 Cellulitis of left lower limb: Secondary | ICD-10-CM | POA: Diagnosis not present

## 2022-04-05 DIAGNOSIS — M6281 Muscle weakness (generalized): Secondary | ICD-10-CM | POA: Diagnosis not present

## 2022-04-05 DIAGNOSIS — R279 Unspecified lack of coordination: Secondary | ICD-10-CM | POA: Diagnosis not present

## 2022-04-06 DIAGNOSIS — L03116 Cellulitis of left lower limb: Secondary | ICD-10-CM | POA: Diagnosis not present

## 2022-04-06 DIAGNOSIS — M6281 Muscle weakness (generalized): Secondary | ICD-10-CM | POA: Diagnosis not present

## 2022-04-06 DIAGNOSIS — I480 Paroxysmal atrial fibrillation: Secondary | ICD-10-CM | POA: Diagnosis not present

## 2022-04-06 DIAGNOSIS — R279 Unspecified lack of coordination: Secondary | ICD-10-CM | POA: Diagnosis not present

## 2022-04-07 DIAGNOSIS — M6281 Muscle weakness (generalized): Secondary | ICD-10-CM | POA: Diagnosis not present

## 2022-04-07 DIAGNOSIS — I480 Paroxysmal atrial fibrillation: Secondary | ICD-10-CM | POA: Diagnosis not present

## 2022-04-07 DIAGNOSIS — R279 Unspecified lack of coordination: Secondary | ICD-10-CM | POA: Diagnosis not present

## 2022-04-07 DIAGNOSIS — L03116 Cellulitis of left lower limb: Secondary | ICD-10-CM | POA: Diagnosis not present

## 2022-04-13 DIAGNOSIS — L03116 Cellulitis of left lower limb: Secondary | ICD-10-CM | POA: Diagnosis not present

## 2022-04-13 DIAGNOSIS — R4681 Obsessive-compulsive behavior: Secondary | ICD-10-CM | POA: Diagnosis not present

## 2022-04-13 DIAGNOSIS — G3184 Mild cognitive impairment, so stated: Secondary | ICD-10-CM | POA: Diagnosis not present

## 2022-04-13 DIAGNOSIS — I480 Paroxysmal atrial fibrillation: Secondary | ICD-10-CM | POA: Diagnosis not present

## 2022-04-13 DIAGNOSIS — M6281 Muscle weakness (generalized): Secondary | ICD-10-CM | POA: Diagnosis not present

## 2022-04-13 DIAGNOSIS — F4322 Adjustment disorder with anxiety: Secondary | ICD-10-CM | POA: Diagnosis not present

## 2022-04-13 DIAGNOSIS — R279 Unspecified lack of coordination: Secondary | ICD-10-CM | POA: Diagnosis not present

## 2022-04-14 DIAGNOSIS — B372 Candidiasis of skin and nail: Secondary | ICD-10-CM | POA: Diagnosis not present

## 2022-04-14 DIAGNOSIS — R21 Rash and other nonspecific skin eruption: Secondary | ICD-10-CM | POA: Diagnosis not present

## 2022-04-14 DIAGNOSIS — I739 Peripheral vascular disease, unspecified: Secondary | ICD-10-CM | POA: Diagnosis not present

## 2022-04-17 DIAGNOSIS — R69 Illness, unspecified: Secondary | ICD-10-CM | POA: Diagnosis not present

## 2022-04-17 DIAGNOSIS — Z7901 Long term (current) use of anticoagulants: Secondary | ICD-10-CM | POA: Diagnosis not present

## 2022-04-17 DIAGNOSIS — M25561 Pain in right knee: Secondary | ICD-10-CM | POA: Diagnosis present

## 2022-04-17 DIAGNOSIS — I872 Venous insufficiency (chronic) (peripheral): Secondary | ICD-10-CM | POA: Diagnosis present

## 2022-04-17 DIAGNOSIS — B372 Candidiasis of skin and nail: Secondary | ICD-10-CM | POA: Diagnosis not present

## 2022-04-17 DIAGNOSIS — L03116 Cellulitis of left lower limb: Secondary | ICD-10-CM | POA: Diagnosis not present

## 2022-04-17 DIAGNOSIS — M549 Dorsalgia, unspecified: Secondary | ICD-10-CM | POA: Diagnosis present

## 2022-04-17 DIAGNOSIS — D721 Eosinophilia, unspecified: Secondary | ICD-10-CM | POA: Diagnosis present

## 2022-04-17 DIAGNOSIS — Z96653 Presence of artificial knee joint, bilateral: Secondary | ICD-10-CM | POA: Diagnosis present

## 2022-04-17 DIAGNOSIS — I10 Essential (primary) hypertension: Secondary | ICD-10-CM | POA: Diagnosis not present

## 2022-04-17 DIAGNOSIS — L259 Unspecified contact dermatitis, unspecified cause: Secondary | ICD-10-CM | POA: Diagnosis not present

## 2022-04-17 DIAGNOSIS — I890 Lymphedema, not elsewhere classified: Secondary | ICD-10-CM | POA: Diagnosis present

## 2022-04-17 DIAGNOSIS — L03115 Cellulitis of right lower limb: Secondary | ICD-10-CM | POA: Diagnosis not present

## 2022-04-17 DIAGNOSIS — Z7982 Long term (current) use of aspirin: Secondary | ICD-10-CM | POA: Diagnosis not present

## 2022-04-17 DIAGNOSIS — M25562 Pain in left knee: Secondary | ICD-10-CM | POA: Diagnosis present

## 2022-04-17 DIAGNOSIS — Z880 Allergy status to penicillin: Secondary | ICD-10-CM | POA: Diagnosis not present

## 2022-04-17 DIAGNOSIS — E785 Hyperlipidemia, unspecified: Secondary | ICD-10-CM | POA: Diagnosis present

## 2022-04-17 DIAGNOSIS — L989 Disorder of the skin and subcutaneous tissue, unspecified: Secondary | ICD-10-CM | POA: Diagnosis present

## 2022-04-17 DIAGNOSIS — R5381 Other malaise: Secondary | ICD-10-CM | POA: Diagnosis not present

## 2022-04-17 DIAGNOSIS — Z888 Allergy status to other drugs, medicaments and biological substances status: Secondary | ICD-10-CM | POA: Diagnosis not present

## 2022-04-17 DIAGNOSIS — Z7401 Bed confinement status: Secondary | ICD-10-CM | POA: Diagnosis not present

## 2022-04-17 DIAGNOSIS — L03312 Cellulitis of back [any part except buttock]: Secondary | ICD-10-CM | POA: Diagnosis not present

## 2022-04-17 DIAGNOSIS — E039 Hypothyroidism, unspecified: Secondary | ICD-10-CM | POA: Diagnosis present

## 2022-04-17 DIAGNOSIS — R21 Rash and other nonspecific skin eruption: Secondary | ICD-10-CM | POA: Diagnosis not present

## 2022-04-17 DIAGNOSIS — R319 Hematuria, unspecified: Secondary | ICD-10-CM | POA: Diagnosis not present

## 2022-04-17 DIAGNOSIS — Z79890 Hormone replacement therapy (postmenopausal): Secondary | ICD-10-CM | POA: Diagnosis not present

## 2022-04-17 DIAGNOSIS — I480 Paroxysmal atrial fibrillation: Secondary | ICD-10-CM | POA: Diagnosis not present

## 2022-04-17 DIAGNOSIS — Z79899 Other long term (current) drug therapy: Secondary | ICD-10-CM | POA: Diagnosis not present

## 2022-04-18 DIAGNOSIS — I480 Paroxysmal atrial fibrillation: Secondary | ICD-10-CM | POA: Diagnosis present

## 2022-04-18 DIAGNOSIS — Z79899 Other long term (current) drug therapy: Secondary | ICD-10-CM | POA: Diagnosis not present

## 2022-04-18 DIAGNOSIS — R21 Rash and other nonspecific skin eruption: Secondary | ICD-10-CM | POA: Diagnosis present

## 2022-04-18 DIAGNOSIS — L989 Disorder of the skin and subcutaneous tissue, unspecified: Secondary | ICD-10-CM | POA: Diagnosis present

## 2022-04-18 DIAGNOSIS — I872 Venous insufficiency (chronic) (peripheral): Secondary | ICD-10-CM | POA: Diagnosis present

## 2022-04-18 DIAGNOSIS — I890 Lymphedema, not elsewhere classified: Secondary | ICD-10-CM | POA: Diagnosis present

## 2022-04-18 DIAGNOSIS — M25562 Pain in left knee: Secondary | ICD-10-CM | POA: Diagnosis present

## 2022-04-18 DIAGNOSIS — Z7401 Bed confinement status: Secondary | ICD-10-CM | POA: Diagnosis not present

## 2022-04-18 DIAGNOSIS — M25561 Pain in right knee: Secondary | ICD-10-CM | POA: Diagnosis present

## 2022-04-18 DIAGNOSIS — E785 Hyperlipidemia, unspecified: Secondary | ICD-10-CM | POA: Diagnosis present

## 2022-04-18 DIAGNOSIS — D721 Eosinophilia, unspecified: Secondary | ICD-10-CM | POA: Diagnosis present

## 2022-04-18 DIAGNOSIS — E039 Hypothyroidism, unspecified: Secondary | ICD-10-CM | POA: Diagnosis present

## 2022-04-18 DIAGNOSIS — I10 Essential (primary) hypertension: Secondary | ICD-10-CM | POA: Diagnosis present

## 2022-04-18 DIAGNOSIS — Z888 Allergy status to other drugs, medicaments and biological substances status: Secondary | ICD-10-CM | POA: Diagnosis not present

## 2022-04-18 DIAGNOSIS — Z96653 Presence of artificial knee joint, bilateral: Secondary | ICD-10-CM | POA: Diagnosis present

## 2022-04-18 DIAGNOSIS — M549 Dorsalgia, unspecified: Secondary | ICD-10-CM | POA: Diagnosis present

## 2022-04-18 DIAGNOSIS — Z7982 Long term (current) use of aspirin: Secondary | ICD-10-CM | POA: Diagnosis not present

## 2022-04-18 DIAGNOSIS — R5381 Other malaise: Secondary | ICD-10-CM | POA: Diagnosis present

## 2022-04-18 DIAGNOSIS — Z79890 Hormone replacement therapy (postmenopausal): Secondary | ICD-10-CM | POA: Diagnosis not present

## 2022-04-18 DIAGNOSIS — Z880 Allergy status to penicillin: Secondary | ICD-10-CM | POA: Diagnosis not present

## 2022-04-18 DIAGNOSIS — Z7901 Long term (current) use of anticoagulants: Secondary | ICD-10-CM | POA: Diagnosis not present

## 2022-04-18 DIAGNOSIS — R69 Illness, unspecified: Secondary | ICD-10-CM | POA: Diagnosis not present

## 2022-04-27 DIAGNOSIS — M6281 Muscle weakness (generalized): Secondary | ICD-10-CM | POA: Diagnosis not present

## 2022-04-27 DIAGNOSIS — R4681 Obsessive-compulsive behavior: Secondary | ICD-10-CM | POA: Diagnosis not present

## 2022-04-27 DIAGNOSIS — I4821 Permanent atrial fibrillation: Secondary | ICD-10-CM | POA: Diagnosis not present

## 2022-04-27 DIAGNOSIS — N400 Enlarged prostate without lower urinary tract symptoms: Secondary | ICD-10-CM | POA: Diagnosis not present

## 2022-04-27 DIAGNOSIS — I739 Peripheral vascular disease, unspecified: Secondary | ICD-10-CM | POA: Diagnosis not present

## 2022-04-27 DIAGNOSIS — Z8673 Personal history of transient ischemic attack (TIA), and cerebral infarction without residual deficits: Secondary | ICD-10-CM | POA: Diagnosis not present

## 2022-04-27 DIAGNOSIS — E781 Pure hyperglyceridemia: Secondary | ICD-10-CM | POA: Diagnosis not present

## 2022-04-27 DIAGNOSIS — I10 Essential (primary) hypertension: Secondary | ICD-10-CM | POA: Diagnosis not present

## 2022-04-27 DIAGNOSIS — M179 Osteoarthritis of knee, unspecified: Secondary | ICD-10-CM | POA: Diagnosis not present

## 2022-04-27 DIAGNOSIS — E039 Hypothyroidism, unspecified: Secondary | ICD-10-CM | POA: Diagnosis not present

## 2022-04-27 DIAGNOSIS — R21 Rash and other nonspecific skin eruption: Secondary | ICD-10-CM | POA: Diagnosis not present

## 2022-04-28 DIAGNOSIS — R799 Abnormal finding of blood chemistry, unspecified: Secondary | ICD-10-CM | POA: Diagnosis not present

## 2022-04-28 DIAGNOSIS — E7800 Pure hypercholesterolemia, unspecified: Secondary | ICD-10-CM | POA: Diagnosis not present

## 2022-04-28 DIAGNOSIS — I119 Hypertensive heart disease without heart failure: Secondary | ICD-10-CM | POA: Diagnosis not present

## 2022-05-01 DIAGNOSIS — R21 Rash and other nonspecific skin eruption: Secondary | ICD-10-CM | POA: Diagnosis not present

## 2022-05-01 DIAGNOSIS — M6281 Muscle weakness (generalized): Secondary | ICD-10-CM | POA: Diagnosis not present

## 2022-05-04 DIAGNOSIS — M6281 Muscle weakness (generalized): Secondary | ICD-10-CM | POA: Diagnosis not present

## 2022-05-04 DIAGNOSIS — R21 Rash and other nonspecific skin eruption: Secondary | ICD-10-CM | POA: Diagnosis not present

## 2022-05-05 DIAGNOSIS — M6281 Muscle weakness (generalized): Secondary | ICD-10-CM | POA: Diagnosis not present

## 2022-05-05 DIAGNOSIS — I4821 Permanent atrial fibrillation: Secondary | ICD-10-CM | POA: Diagnosis not present

## 2022-05-05 DIAGNOSIS — I739 Peripheral vascular disease, unspecified: Secondary | ICD-10-CM | POA: Diagnosis not present

## 2022-05-05 DIAGNOSIS — R799 Abnormal finding of blood chemistry, unspecified: Secondary | ICD-10-CM | POA: Diagnosis not present

## 2022-05-05 DIAGNOSIS — R21 Rash and other nonspecific skin eruption: Secondary | ICD-10-CM | POA: Diagnosis not present

## 2022-05-05 DIAGNOSIS — N400 Enlarged prostate without lower urinary tract symptoms: Secondary | ICD-10-CM | POA: Diagnosis not present

## 2022-05-05 DIAGNOSIS — Z8673 Personal history of transient ischemic attack (TIA), and cerebral infarction without residual deficits: Secondary | ICD-10-CM | POA: Diagnosis not present

## 2022-05-05 DIAGNOSIS — I119 Hypertensive heart disease without heart failure: Secondary | ICD-10-CM | POA: Diagnosis not present

## 2022-05-05 DIAGNOSIS — M179 Osteoarthritis of knee, unspecified: Secondary | ICD-10-CM | POA: Diagnosis not present

## 2022-05-05 DIAGNOSIS — R4681 Obsessive-compulsive behavior: Secondary | ICD-10-CM | POA: Diagnosis not present

## 2022-05-05 DIAGNOSIS — E7800 Pure hypercholesterolemia, unspecified: Secondary | ICD-10-CM | POA: Diagnosis not present

## 2022-05-05 DIAGNOSIS — E039 Hypothyroidism, unspecified: Secondary | ICD-10-CM | POA: Diagnosis not present

## 2022-05-08 DIAGNOSIS — R21 Rash and other nonspecific skin eruption: Secondary | ICD-10-CM | POA: Diagnosis not present

## 2022-05-08 DIAGNOSIS — M6281 Muscle weakness (generalized): Secondary | ICD-10-CM | POA: Diagnosis not present

## 2022-05-09 DIAGNOSIS — R319 Hematuria, unspecified: Secondary | ICD-10-CM | POA: Diagnosis not present

## 2022-05-09 DIAGNOSIS — R799 Abnormal finding of blood chemistry, unspecified: Secondary | ICD-10-CM | POA: Diagnosis not present

## 2022-05-10 DIAGNOSIS — I119 Hypertensive heart disease without heart failure: Secondary | ICD-10-CM | POA: Diagnosis not present

## 2022-05-10 DIAGNOSIS — E7800 Pure hypercholesterolemia, unspecified: Secondary | ICD-10-CM | POA: Diagnosis not present

## 2022-05-10 DIAGNOSIS — F4322 Adjustment disorder with anxiety: Secondary | ICD-10-CM | POA: Diagnosis not present

## 2022-05-11 DIAGNOSIS — I480 Paroxysmal atrial fibrillation: Secondary | ICD-10-CM | POA: Diagnosis present

## 2022-05-11 DIAGNOSIS — R5381 Other malaise: Secondary | ICD-10-CM | POA: Diagnosis not present

## 2022-05-11 DIAGNOSIS — R251 Tremor, unspecified: Secondary | ICD-10-CM | POA: Diagnosis not present

## 2022-05-11 DIAGNOSIS — I10 Essential (primary) hypertension: Secondary | ICD-10-CM | POA: Diagnosis present

## 2022-05-11 DIAGNOSIS — N201 Calculus of ureter: Secondary | ICD-10-CM | POA: Diagnosis present

## 2022-05-11 DIAGNOSIS — E785 Hyperlipidemia, unspecified: Secondary | ICD-10-CM | POA: Diagnosis present

## 2022-05-11 DIAGNOSIS — R69 Illness, unspecified: Secondary | ICD-10-CM | POA: Diagnosis not present

## 2022-05-11 DIAGNOSIS — R6883 Chills (without fever): Secondary | ICD-10-CM | POA: Diagnosis not present

## 2022-05-11 DIAGNOSIS — E569 Vitamin deficiency, unspecified: Secondary | ICD-10-CM | POA: Diagnosis not present

## 2022-05-11 DIAGNOSIS — I872 Venous insufficiency (chronic) (peripheral): Secondary | ICD-10-CM | POA: Diagnosis present

## 2022-05-11 DIAGNOSIS — Z1159 Encounter for screening for other viral diseases: Secondary | ICD-10-CM | POA: Diagnosis not present

## 2022-05-11 DIAGNOSIS — R21 Rash and other nonspecific skin eruption: Secondary | ICD-10-CM | POA: Diagnosis present

## 2022-05-11 DIAGNOSIS — M6281 Muscle weakness (generalized): Secondary | ICD-10-CM | POA: Diagnosis not present

## 2022-05-11 DIAGNOSIS — I878 Other specified disorders of veins: Secondary | ICD-10-CM | POA: Diagnosis present

## 2022-05-11 DIAGNOSIS — L233 Allergic contact dermatitis due to drugs in contact with skin: Secondary | ICD-10-CM | POA: Diagnosis present

## 2022-05-11 DIAGNOSIS — I890 Lymphedema, not elsewhere classified: Secondary | ICD-10-CM | POA: Diagnosis present

## 2022-05-11 DIAGNOSIS — Z7901 Long term (current) use of anticoagulants: Secondary | ICD-10-CM | POA: Diagnosis not present

## 2022-05-11 DIAGNOSIS — T498X5A Adverse effect of other topical agents, initial encounter: Secondary | ICD-10-CM | POA: Diagnosis present

## 2022-05-11 DIAGNOSIS — Z7401 Bed confinement status: Secondary | ICD-10-CM | POA: Diagnosis not present

## 2022-05-11 DIAGNOSIS — N200 Calculus of kidney: Secondary | ICD-10-CM | POA: Diagnosis not present

## 2022-05-11 DIAGNOSIS — Z6833 Body mass index (BMI) 33.0-33.9, adult: Secondary | ICD-10-CM | POA: Diagnosis not present

## 2022-05-11 DIAGNOSIS — K4090 Unilateral inguinal hernia, without obstruction or gangrene, not specified as recurrent: Secondary | ICD-10-CM | POA: Diagnosis present

## 2022-05-11 DIAGNOSIS — R609 Edema, unspecified: Secondary | ICD-10-CM | POA: Diagnosis not present

## 2022-05-11 DIAGNOSIS — N210 Calculus in bladder: Secondary | ICD-10-CM | POA: Diagnosis present

## 2022-05-11 DIAGNOSIS — M79602 Pain in left arm: Secondary | ICD-10-CM | POA: Diagnosis not present

## 2022-05-11 DIAGNOSIS — D721 Eosinophilia, unspecified: Secondary | ICD-10-CM | POA: Diagnosis present

## 2022-05-11 DIAGNOSIS — E8809 Other disorders of plasma-protein metabolism, not elsewhere classified: Secondary | ICD-10-CM | POA: Diagnosis present

## 2022-05-11 DIAGNOSIS — E039 Hypothyroidism, unspecified: Secondary | ICD-10-CM | POA: Diagnosis present

## 2022-05-11 DIAGNOSIS — L0390 Cellulitis, unspecified: Secondary | ICD-10-CM | POA: Diagnosis not present

## 2022-05-11 DIAGNOSIS — M25512 Pain in left shoulder: Secondary | ICD-10-CM | POA: Diagnosis not present

## 2022-05-11 DIAGNOSIS — R229 Localized swelling, mass and lump, unspecified: Secondary | ICD-10-CM | POA: Diagnosis not present

## 2022-05-11 DIAGNOSIS — K5900 Constipation, unspecified: Secondary | ICD-10-CM | POA: Diagnosis not present

## 2022-05-11 DIAGNOSIS — R601 Generalized edema: Secondary | ICD-10-CM | POA: Diagnosis not present

## 2022-05-11 DIAGNOSIS — L539 Erythematous condition, unspecified: Secondary | ICD-10-CM | POA: Diagnosis not present

## 2022-05-11 DIAGNOSIS — R230 Cyanosis: Secondary | ICD-10-CM | POA: Diagnosis not present

## 2022-05-11 DIAGNOSIS — I4891 Unspecified atrial fibrillation: Secondary | ICD-10-CM | POA: Diagnosis not present

## 2022-05-11 DIAGNOSIS — E612 Magnesium deficiency: Secondary | ICD-10-CM | POA: Diagnosis not present

## 2022-05-11 DIAGNOSIS — E559 Vitamin D deficiency, unspecified: Secondary | ICD-10-CM | POA: Diagnosis not present

## 2022-05-11 DIAGNOSIS — R509 Fever, unspecified: Secondary | ICD-10-CM | POA: Diagnosis not present

## 2022-05-12 DIAGNOSIS — N210 Calculus in bladder: Secondary | ICD-10-CM | POA: Diagnosis present

## 2022-05-12 DIAGNOSIS — I10 Essential (primary) hypertension: Secondary | ICD-10-CM | POA: Diagnosis not present

## 2022-05-12 DIAGNOSIS — M6281 Muscle weakness (generalized): Secondary | ICD-10-CM | POA: Diagnosis not present

## 2022-05-12 DIAGNOSIS — D721 Eosinophilia, unspecified: Secondary | ICD-10-CM | POA: Diagnosis present

## 2022-05-12 DIAGNOSIS — K4090 Unilateral inguinal hernia, without obstruction or gangrene, not specified as recurrent: Secondary | ICD-10-CM | POA: Diagnosis present

## 2022-05-12 DIAGNOSIS — E785 Hyperlipidemia, unspecified: Secondary | ICD-10-CM | POA: Diagnosis not present

## 2022-05-12 DIAGNOSIS — E569 Vitamin deficiency, unspecified: Secondary | ICD-10-CM | POA: Diagnosis not present

## 2022-05-12 DIAGNOSIS — E612 Magnesium deficiency: Secondary | ICD-10-CM | POA: Diagnosis not present

## 2022-05-12 DIAGNOSIS — E559 Vitamin D deficiency, unspecified: Secondary | ICD-10-CM | POA: Diagnosis not present

## 2022-05-12 DIAGNOSIS — L0390 Cellulitis, unspecified: Secondary | ICD-10-CM | POA: Diagnosis not present

## 2022-05-12 DIAGNOSIS — K5900 Constipation, unspecified: Secondary | ICD-10-CM | POA: Diagnosis not present

## 2022-05-12 DIAGNOSIS — R69 Illness, unspecified: Secondary | ICD-10-CM | POA: Diagnosis not present

## 2022-05-12 DIAGNOSIS — Z7901 Long term (current) use of anticoagulants: Secondary | ICD-10-CM | POA: Diagnosis not present

## 2022-05-12 DIAGNOSIS — Z6833 Body mass index (BMI) 33.0-33.9, adult: Secondary | ICD-10-CM | POA: Diagnosis not present

## 2022-05-12 DIAGNOSIS — N201 Calculus of ureter: Secondary | ICD-10-CM | POA: Diagnosis present

## 2022-05-12 DIAGNOSIS — I890 Lymphedema, not elsewhere classified: Secondary | ICD-10-CM | POA: Diagnosis present

## 2022-05-12 DIAGNOSIS — L233 Allergic contact dermatitis due to drugs in contact with skin: Secondary | ICD-10-CM | POA: Diagnosis present

## 2022-05-12 DIAGNOSIS — I872 Venous insufficiency (chronic) (peripheral): Secondary | ICD-10-CM | POA: Diagnosis present

## 2022-05-12 DIAGNOSIS — I878 Other specified disorders of veins: Secondary | ICD-10-CM | POA: Diagnosis present

## 2022-05-12 DIAGNOSIS — I480 Paroxysmal atrial fibrillation: Secondary | ICD-10-CM | POA: Diagnosis not present

## 2022-05-12 DIAGNOSIS — R21 Rash and other nonspecific skin eruption: Secondary | ICD-10-CM | POA: Diagnosis not present

## 2022-05-12 DIAGNOSIS — E8809 Other disorders of plasma-protein metabolism, not elsewhere classified: Secondary | ICD-10-CM | POA: Diagnosis present

## 2022-05-12 DIAGNOSIS — Z1159 Encounter for screening for other viral diseases: Secondary | ICD-10-CM | POA: Diagnosis not present

## 2022-05-12 DIAGNOSIS — E039 Hypothyroidism, unspecified: Secondary | ICD-10-CM | POA: Diagnosis not present

## 2022-05-12 DIAGNOSIS — R5381 Other malaise: Secondary | ICD-10-CM | POA: Diagnosis not present

## 2022-05-12 DIAGNOSIS — T498X5A Adverse effect of other topical agents, initial encounter: Secondary | ICD-10-CM | POA: Diagnosis present

## 2022-05-12 DIAGNOSIS — R601 Generalized edema: Secondary | ICD-10-CM | POA: Diagnosis not present

## 2022-05-12 DIAGNOSIS — Z7401 Bed confinement status: Secondary | ICD-10-CM | POA: Diagnosis not present

## 2022-05-12 DIAGNOSIS — N200 Calculus of kidney: Secondary | ICD-10-CM | POA: Diagnosis not present

## 2022-05-13 DIAGNOSIS — R21 Rash and other nonspecific skin eruption: Secondary | ICD-10-CM | POA: Diagnosis not present

## 2022-05-13 DIAGNOSIS — E785 Hyperlipidemia, unspecified: Secondary | ICD-10-CM | POA: Diagnosis not present

## 2022-05-13 DIAGNOSIS — N200 Calculus of kidney: Secondary | ICD-10-CM | POA: Diagnosis not present

## 2022-05-13 DIAGNOSIS — R601 Generalized edema: Secondary | ICD-10-CM | POA: Diagnosis not present

## 2022-05-13 DIAGNOSIS — L0390 Cellulitis, unspecified: Secondary | ICD-10-CM | POA: Diagnosis not present

## 2022-05-13 DIAGNOSIS — I480 Paroxysmal atrial fibrillation: Secondary | ICD-10-CM | POA: Diagnosis not present

## 2022-05-13 DIAGNOSIS — E039 Hypothyroidism, unspecified: Secondary | ICD-10-CM | POA: Diagnosis not present

## 2022-05-14 DIAGNOSIS — E785 Hyperlipidemia, unspecified: Secondary | ICD-10-CM | POA: Diagnosis not present

## 2022-05-14 DIAGNOSIS — N200 Calculus of kidney: Secondary | ICD-10-CM | POA: Diagnosis not present

## 2022-05-14 DIAGNOSIS — L0390 Cellulitis, unspecified: Secondary | ICD-10-CM | POA: Diagnosis not present

## 2022-05-14 DIAGNOSIS — E039 Hypothyroidism, unspecified: Secondary | ICD-10-CM | POA: Diagnosis not present

## 2022-05-14 DIAGNOSIS — I480 Paroxysmal atrial fibrillation: Secondary | ICD-10-CM | POA: Diagnosis not present

## 2022-05-14 DIAGNOSIS — R601 Generalized edema: Secondary | ICD-10-CM | POA: Diagnosis not present

## 2022-05-14 DIAGNOSIS — R21 Rash and other nonspecific skin eruption: Secondary | ICD-10-CM | POA: Diagnosis not present

## 2022-05-15 DIAGNOSIS — R21 Rash and other nonspecific skin eruption: Secondary | ICD-10-CM | POA: Diagnosis not present

## 2022-05-15 DIAGNOSIS — E039 Hypothyroidism, unspecified: Secondary | ICD-10-CM | POA: Diagnosis not present

## 2022-05-15 DIAGNOSIS — E785 Hyperlipidemia, unspecified: Secondary | ICD-10-CM | POA: Diagnosis not present

## 2022-05-15 DIAGNOSIS — L0390 Cellulitis, unspecified: Secondary | ICD-10-CM | POA: Diagnosis not present

## 2022-05-15 DIAGNOSIS — R601 Generalized edema: Secondary | ICD-10-CM | POA: Diagnosis not present

## 2022-05-15 DIAGNOSIS — I480 Paroxysmal atrial fibrillation: Secondary | ICD-10-CM | POA: Diagnosis not present

## 2022-05-15 DIAGNOSIS — N200 Calculus of kidney: Secondary | ICD-10-CM | POA: Diagnosis not present

## 2022-05-16 DIAGNOSIS — E785 Hyperlipidemia, unspecified: Secondary | ICD-10-CM | POA: Diagnosis not present

## 2022-05-16 DIAGNOSIS — R601 Generalized edema: Secondary | ICD-10-CM | POA: Diagnosis not present

## 2022-05-16 DIAGNOSIS — E039 Hypothyroidism, unspecified: Secondary | ICD-10-CM | POA: Diagnosis not present

## 2022-05-16 DIAGNOSIS — N200 Calculus of kidney: Secondary | ICD-10-CM | POA: Diagnosis not present

## 2022-05-16 DIAGNOSIS — I480 Paroxysmal atrial fibrillation: Secondary | ICD-10-CM | POA: Diagnosis not present

## 2022-05-16 DIAGNOSIS — R21 Rash and other nonspecific skin eruption: Secondary | ICD-10-CM | POA: Diagnosis not present

## 2022-05-17 DIAGNOSIS — E039 Hypothyroidism, unspecified: Secondary | ICD-10-CM | POA: Diagnosis not present

## 2022-05-17 DIAGNOSIS — E785 Hyperlipidemia, unspecified: Secondary | ICD-10-CM | POA: Diagnosis not present

## 2022-05-17 DIAGNOSIS — I480 Paroxysmal atrial fibrillation: Secondary | ICD-10-CM | POA: Diagnosis not present

## 2022-05-17 DIAGNOSIS — N200 Calculus of kidney: Secondary | ICD-10-CM | POA: Diagnosis not present

## 2022-05-17 DIAGNOSIS — R601 Generalized edema: Secondary | ICD-10-CM | POA: Diagnosis not present

## 2022-05-17 DIAGNOSIS — R21 Rash and other nonspecific skin eruption: Secondary | ICD-10-CM | POA: Diagnosis not present

## 2022-05-24 DIAGNOSIS — E569 Vitamin deficiency, unspecified: Secondary | ICD-10-CM | POA: Diagnosis not present

## 2022-05-24 DIAGNOSIS — L97811 Non-pressure chronic ulcer of other part of right lower leg limited to breakdown of skin: Secondary | ICD-10-CM | POA: Diagnosis not present

## 2022-05-24 DIAGNOSIS — K5900 Constipation, unspecified: Secondary | ICD-10-CM | POA: Diagnosis not present

## 2022-05-24 DIAGNOSIS — L97211 Non-pressure chronic ulcer of right calf limited to breakdown of skin: Secondary | ICD-10-CM | POA: Diagnosis not present

## 2022-05-24 DIAGNOSIS — R21 Rash and other nonspecific skin eruption: Secondary | ICD-10-CM | POA: Diagnosis not present

## 2022-05-24 DIAGNOSIS — R601 Generalized edema: Secondary | ICD-10-CM | POA: Diagnosis not present

## 2022-05-24 DIAGNOSIS — N201 Calculus of ureter: Secondary | ICD-10-CM | POA: Diagnosis not present

## 2022-05-24 DIAGNOSIS — Z7401 Bed confinement status: Secondary | ICD-10-CM | POA: Diagnosis not present

## 2022-05-24 DIAGNOSIS — I87311 Chronic venous hypertension (idiopathic) with ulcer of right lower extremity: Secondary | ICD-10-CM | POA: Diagnosis not present

## 2022-05-24 DIAGNOSIS — M6281 Muscle weakness (generalized): Secondary | ICD-10-CM | POA: Diagnosis not present

## 2022-05-24 DIAGNOSIS — E785 Hyperlipidemia, unspecified: Secondary | ICD-10-CM | POA: Diagnosis not present

## 2022-05-24 DIAGNOSIS — R293 Abnormal posture: Secondary | ICD-10-CM | POA: Diagnosis not present

## 2022-05-24 DIAGNOSIS — R69 Illness, unspecified: Secondary | ICD-10-CM | POA: Diagnosis not present

## 2022-05-24 DIAGNOSIS — E559 Vitamin D deficiency, unspecified: Secondary | ICD-10-CM | POA: Diagnosis not present

## 2022-05-24 DIAGNOSIS — R278 Other lack of coordination: Secondary | ICD-10-CM | POA: Diagnosis not present

## 2022-05-24 DIAGNOSIS — R4182 Altered mental status, unspecified: Secondary | ICD-10-CM | POA: Diagnosis not present

## 2022-05-24 DIAGNOSIS — Z6833 Body mass index (BMI) 33.0-33.9, adult: Secondary | ICD-10-CM | POA: Diagnosis not present

## 2022-05-24 DIAGNOSIS — K648 Other hemorrhoids: Secondary | ICD-10-CM | POA: Diagnosis not present

## 2022-05-24 DIAGNOSIS — E8809 Other disorders of plasma-protein metabolism, not elsewhere classified: Secondary | ICD-10-CM | POA: Diagnosis not present

## 2022-05-24 DIAGNOSIS — I87312 Chronic venous hypertension (idiopathic) with ulcer of left lower extremity: Secondary | ICD-10-CM | POA: Diagnosis not present

## 2022-05-24 DIAGNOSIS — R41841 Cognitive communication deficit: Secondary | ICD-10-CM | POA: Diagnosis not present

## 2022-05-24 DIAGNOSIS — H918X2 Other specified hearing loss, left ear: Secondary | ICD-10-CM | POA: Diagnosis not present

## 2022-05-24 DIAGNOSIS — M170 Bilateral primary osteoarthritis of knee: Secondary | ICD-10-CM | POA: Diagnosis not present

## 2022-05-24 DIAGNOSIS — N200 Calculus of kidney: Secondary | ICD-10-CM | POA: Diagnosis not present

## 2022-05-24 DIAGNOSIS — E039 Hypothyroidism, unspecified: Secondary | ICD-10-CM | POA: Diagnosis not present

## 2022-05-24 DIAGNOSIS — L97222 Non-pressure chronic ulcer of left calf with fat layer exposed: Secondary | ICD-10-CM | POA: Diagnosis not present

## 2022-05-24 DIAGNOSIS — I10 Essential (primary) hypertension: Secondary | ICD-10-CM | POA: Diagnosis not present

## 2022-05-24 DIAGNOSIS — E669 Obesity, unspecified: Secondary | ICD-10-CM | POA: Diagnosis not present

## 2022-05-24 DIAGNOSIS — Z7901 Long term (current) use of anticoagulants: Secondary | ICD-10-CM | POA: Diagnosis not present

## 2022-05-24 DIAGNOSIS — Z1159 Encounter for screening for other viral diseases: Secondary | ICD-10-CM | POA: Diagnosis not present

## 2022-05-24 DIAGNOSIS — L22 Diaper dermatitis: Secondary | ICD-10-CM | POA: Diagnosis not present

## 2022-05-24 DIAGNOSIS — I480 Paroxysmal atrial fibrillation: Secondary | ICD-10-CM | POA: Diagnosis not present

## 2022-05-24 DIAGNOSIS — E612 Magnesium deficiency: Secondary | ICD-10-CM | POA: Diagnosis not present

## 2022-05-24 DIAGNOSIS — L538 Other specified erythematous conditions: Secondary | ICD-10-CM | POA: Diagnosis not present

## 2022-05-24 DIAGNOSIS — L309 Dermatitis, unspecified: Secondary | ICD-10-CM | POA: Diagnosis not present

## 2022-05-24 DIAGNOSIS — R5381 Other malaise: Secondary | ICD-10-CM | POA: Diagnosis not present

## 2022-05-26 DIAGNOSIS — I10 Essential (primary) hypertension: Secondary | ICD-10-CM | POA: Diagnosis not present

## 2022-05-26 DIAGNOSIS — E785 Hyperlipidemia, unspecified: Secondary | ICD-10-CM | POA: Diagnosis not present

## 2022-05-26 DIAGNOSIS — I480 Paroxysmal atrial fibrillation: Secondary | ICD-10-CM | POA: Diagnosis not present

## 2022-05-26 DIAGNOSIS — E039 Hypothyroidism, unspecified: Secondary | ICD-10-CM | POA: Diagnosis not present

## 2022-05-28 DIAGNOSIS — E569 Vitamin deficiency, unspecified: Secondary | ICD-10-CM | POA: Diagnosis not present

## 2022-05-28 DIAGNOSIS — E039 Hypothyroidism, unspecified: Secondary | ICD-10-CM | POA: Diagnosis not present

## 2022-05-28 DIAGNOSIS — L22 Diaper dermatitis: Secondary | ICD-10-CM | POA: Diagnosis not present

## 2022-05-29 DIAGNOSIS — I480 Paroxysmal atrial fibrillation: Secondary | ICD-10-CM | POA: Diagnosis not present

## 2022-05-29 DIAGNOSIS — N200 Calculus of kidney: Secondary | ICD-10-CM | POA: Diagnosis not present

## 2022-05-29 DIAGNOSIS — R601 Generalized edema: Secondary | ICD-10-CM | POA: Diagnosis not present

## 2022-05-30 DIAGNOSIS — E785 Hyperlipidemia, unspecified: Secondary | ICD-10-CM | POA: Diagnosis not present

## 2022-05-30 DIAGNOSIS — I480 Paroxysmal atrial fibrillation: Secondary | ICD-10-CM | POA: Diagnosis not present

## 2022-05-30 DIAGNOSIS — E039 Hypothyroidism, unspecified: Secondary | ICD-10-CM | POA: Diagnosis not present

## 2022-05-30 DIAGNOSIS — E569 Vitamin deficiency, unspecified: Secondary | ICD-10-CM | POA: Diagnosis not present

## 2022-05-31 DIAGNOSIS — I480 Paroxysmal atrial fibrillation: Secondary | ICD-10-CM | POA: Diagnosis not present

## 2022-05-31 DIAGNOSIS — M170 Bilateral primary osteoarthritis of knee: Secondary | ICD-10-CM | POA: Diagnosis not present

## 2022-05-31 DIAGNOSIS — N200 Calculus of kidney: Secondary | ICD-10-CM | POA: Diagnosis not present

## 2022-05-31 DIAGNOSIS — E8809 Other disorders of plasma-protein metabolism, not elsewhere classified: Secondary | ICD-10-CM | POA: Diagnosis not present

## 2022-05-31 DIAGNOSIS — R601 Generalized edema: Secondary | ICD-10-CM | POA: Diagnosis not present

## 2022-06-02 DIAGNOSIS — L538 Other specified erythematous conditions: Secondary | ICD-10-CM | POA: Diagnosis not present

## 2022-06-03 DIAGNOSIS — I480 Paroxysmal atrial fibrillation: Secondary | ICD-10-CM | POA: Diagnosis not present

## 2022-06-03 DIAGNOSIS — M6281 Muscle weakness (generalized): Secondary | ICD-10-CM | POA: Diagnosis not present

## 2022-06-05 DIAGNOSIS — E039 Hypothyroidism, unspecified: Secondary | ICD-10-CM | POA: Diagnosis not present

## 2022-06-05 DIAGNOSIS — L22 Diaper dermatitis: Secondary | ICD-10-CM | POA: Diagnosis not present

## 2022-06-05 DIAGNOSIS — I480 Paroxysmal atrial fibrillation: Secondary | ICD-10-CM | POA: Diagnosis not present

## 2022-06-05 DIAGNOSIS — R601 Generalized edema: Secondary | ICD-10-CM | POA: Diagnosis not present

## 2022-06-05 DIAGNOSIS — N200 Calculus of kidney: Secondary | ICD-10-CM | POA: Diagnosis not present

## 2022-06-05 DIAGNOSIS — E569 Vitamin deficiency, unspecified: Secondary | ICD-10-CM | POA: Diagnosis not present

## 2022-06-05 DIAGNOSIS — R4182 Altered mental status, unspecified: Secondary | ICD-10-CM | POA: Diagnosis not present

## 2022-06-05 DIAGNOSIS — M6281 Muscle weakness (generalized): Secondary | ICD-10-CM | POA: Diagnosis not present

## 2022-06-07 DIAGNOSIS — I480 Paroxysmal atrial fibrillation: Secondary | ICD-10-CM | POA: Diagnosis not present

## 2022-06-07 DIAGNOSIS — N200 Calculus of kidney: Secondary | ICD-10-CM | POA: Diagnosis not present

## 2022-06-07 DIAGNOSIS — R601 Generalized edema: Secondary | ICD-10-CM | POA: Diagnosis not present

## 2022-06-12 DIAGNOSIS — L22 Diaper dermatitis: Secondary | ICD-10-CM | POA: Diagnosis not present

## 2022-06-12 DIAGNOSIS — E039 Hypothyroidism, unspecified: Secondary | ICD-10-CM | POA: Diagnosis not present

## 2022-06-12 DIAGNOSIS — E559 Vitamin D deficiency, unspecified: Secondary | ICD-10-CM | POA: Diagnosis not present

## 2022-06-12 DIAGNOSIS — L309 Dermatitis, unspecified: Secondary | ICD-10-CM | POA: Diagnosis not present

## 2022-06-13 DIAGNOSIS — I480 Paroxysmal atrial fibrillation: Secondary | ICD-10-CM | POA: Diagnosis not present

## 2022-06-13 DIAGNOSIS — E039 Hypothyroidism, unspecified: Secondary | ICD-10-CM | POA: Diagnosis not present

## 2022-06-13 DIAGNOSIS — I10 Essential (primary) hypertension: Secondary | ICD-10-CM | POA: Diagnosis not present

## 2022-06-13 DIAGNOSIS — E785 Hyperlipidemia, unspecified: Secondary | ICD-10-CM | POA: Diagnosis not present

## 2022-06-14 DIAGNOSIS — R601 Generalized edema: Secondary | ICD-10-CM | POA: Diagnosis not present

## 2022-06-14 DIAGNOSIS — I480 Paroxysmal atrial fibrillation: Secondary | ICD-10-CM | POA: Diagnosis not present

## 2022-06-14 DIAGNOSIS — N200 Calculus of kidney: Secondary | ICD-10-CM | POA: Diagnosis not present

## 2022-06-18 DIAGNOSIS — M6281 Muscle weakness (generalized): Secondary | ICD-10-CM | POA: Diagnosis not present

## 2022-06-18 DIAGNOSIS — I480 Paroxysmal atrial fibrillation: Secondary | ICD-10-CM | POA: Diagnosis not present

## 2022-06-19 DIAGNOSIS — L22 Diaper dermatitis: Secondary | ICD-10-CM | POA: Diagnosis not present

## 2022-06-19 DIAGNOSIS — N200 Calculus of kidney: Secondary | ICD-10-CM | POA: Diagnosis not present

## 2022-06-19 DIAGNOSIS — E039 Hypothyroidism, unspecified: Secondary | ICD-10-CM | POA: Diagnosis not present

## 2022-06-19 DIAGNOSIS — R601 Generalized edema: Secondary | ICD-10-CM | POA: Diagnosis not present

## 2022-06-19 DIAGNOSIS — I480 Paroxysmal atrial fibrillation: Secondary | ICD-10-CM | POA: Diagnosis not present

## 2022-06-19 DIAGNOSIS — L97811 Non-pressure chronic ulcer of other part of right lower leg limited to breakdown of skin: Secondary | ICD-10-CM | POA: Diagnosis not present

## 2022-06-19 DIAGNOSIS — E559 Vitamin D deficiency, unspecified: Secondary | ICD-10-CM | POA: Diagnosis not present

## 2022-06-20 DIAGNOSIS — L309 Dermatitis, unspecified: Secondary | ICD-10-CM | POA: Diagnosis not present

## 2022-06-27 DIAGNOSIS — I480 Paroxysmal atrial fibrillation: Secondary | ICD-10-CM | POA: Diagnosis not present

## 2022-06-27 DIAGNOSIS — M6281 Muscle weakness (generalized): Secondary | ICD-10-CM | POA: Diagnosis not present

## 2022-06-28 DIAGNOSIS — I480 Paroxysmal atrial fibrillation: Secondary | ICD-10-CM | POA: Diagnosis not present

## 2022-06-28 DIAGNOSIS — R601 Generalized edema: Secondary | ICD-10-CM | POA: Diagnosis not present

## 2022-06-28 DIAGNOSIS — N200 Calculus of kidney: Secondary | ICD-10-CM | POA: Diagnosis not present

## 2022-06-30 DIAGNOSIS — E039 Hypothyroidism, unspecified: Secondary | ICD-10-CM | POA: Diagnosis not present

## 2022-06-30 DIAGNOSIS — E559 Vitamin D deficiency, unspecified: Secondary | ICD-10-CM | POA: Diagnosis not present

## 2022-06-30 DIAGNOSIS — L97811 Non-pressure chronic ulcer of other part of right lower leg limited to breakdown of skin: Secondary | ICD-10-CM | POA: Diagnosis not present

## 2022-06-30 DIAGNOSIS — L22 Diaper dermatitis: Secondary | ICD-10-CM | POA: Diagnosis not present

## 2022-07-03 DIAGNOSIS — E559 Vitamin D deficiency, unspecified: Secondary | ICD-10-CM | POA: Diagnosis not present

## 2022-07-03 DIAGNOSIS — L97811 Non-pressure chronic ulcer of other part of right lower leg limited to breakdown of skin: Secondary | ICD-10-CM | POA: Diagnosis not present

## 2022-07-03 DIAGNOSIS — I480 Paroxysmal atrial fibrillation: Secondary | ICD-10-CM | POA: Diagnosis not present

## 2022-07-03 DIAGNOSIS — N200 Calculus of kidney: Secondary | ICD-10-CM | POA: Diagnosis not present

## 2022-07-03 DIAGNOSIS — L22 Diaper dermatitis: Secondary | ICD-10-CM | POA: Diagnosis not present

## 2022-07-03 DIAGNOSIS — E039 Hypothyroidism, unspecified: Secondary | ICD-10-CM | POA: Diagnosis not present

## 2022-07-03 DIAGNOSIS — R601 Generalized edema: Secondary | ICD-10-CM | POA: Diagnosis not present

## 2022-07-04 DIAGNOSIS — M6281 Muscle weakness (generalized): Secondary | ICD-10-CM | POA: Diagnosis not present

## 2022-07-04 DIAGNOSIS — I480 Paroxysmal atrial fibrillation: Secondary | ICD-10-CM | POA: Diagnosis not present

## 2022-07-05 DIAGNOSIS — N200 Calculus of kidney: Secondary | ICD-10-CM | POA: Diagnosis not present

## 2022-07-05 DIAGNOSIS — R601 Generalized edema: Secondary | ICD-10-CM | POA: Diagnosis not present

## 2022-07-05 DIAGNOSIS — I480 Paroxysmal atrial fibrillation: Secondary | ICD-10-CM | POA: Diagnosis not present

## 2022-07-08 DIAGNOSIS — M6281 Muscle weakness (generalized): Secondary | ICD-10-CM | POA: Diagnosis not present

## 2022-07-08 DIAGNOSIS — I480 Paroxysmal atrial fibrillation: Secondary | ICD-10-CM | POA: Diagnosis not present

## 2022-07-10 DIAGNOSIS — R601 Generalized edema: Secondary | ICD-10-CM | POA: Diagnosis not present

## 2022-07-10 DIAGNOSIS — L22 Diaper dermatitis: Secondary | ICD-10-CM | POA: Diagnosis not present

## 2022-07-10 DIAGNOSIS — L309 Dermatitis, unspecified: Secondary | ICD-10-CM | POA: Diagnosis not present

## 2022-07-10 DIAGNOSIS — E039 Hypothyroidism, unspecified: Secondary | ICD-10-CM | POA: Diagnosis not present

## 2022-07-10 DIAGNOSIS — E559 Vitamin D deficiency, unspecified: Secondary | ICD-10-CM | POA: Diagnosis not present

## 2022-07-11 DIAGNOSIS — N201 Calculus of ureter: Secondary | ICD-10-CM | POA: Diagnosis not present

## 2022-07-17 DIAGNOSIS — I480 Paroxysmal atrial fibrillation: Secondary | ICD-10-CM | POA: Diagnosis not present

## 2022-07-17 DIAGNOSIS — N200 Calculus of kidney: Secondary | ICD-10-CM | POA: Diagnosis not present

## 2022-07-17 DIAGNOSIS — R601 Generalized edema: Secondary | ICD-10-CM | POA: Diagnosis not present

## 2022-07-17 DIAGNOSIS — L97211 Non-pressure chronic ulcer of right calf limited to breakdown of skin: Secondary | ICD-10-CM | POA: Diagnosis not present

## 2022-07-17 DIAGNOSIS — L22 Diaper dermatitis: Secondary | ICD-10-CM | POA: Diagnosis not present

## 2022-07-17 DIAGNOSIS — E559 Vitamin D deficiency, unspecified: Secondary | ICD-10-CM | POA: Diagnosis not present

## 2022-07-17 DIAGNOSIS — L309 Dermatitis, unspecified: Secondary | ICD-10-CM | POA: Diagnosis not present

## 2022-07-19 DIAGNOSIS — N200 Calculus of kidney: Secondary | ICD-10-CM | POA: Diagnosis not present

## 2022-07-19 DIAGNOSIS — I480 Paroxysmal atrial fibrillation: Secondary | ICD-10-CM | POA: Diagnosis not present

## 2022-07-19 DIAGNOSIS — R601 Generalized edema: Secondary | ICD-10-CM | POA: Diagnosis not present

## 2022-07-24 DIAGNOSIS — I480 Paroxysmal atrial fibrillation: Secondary | ICD-10-CM | POA: Diagnosis not present

## 2022-07-24 DIAGNOSIS — L22 Diaper dermatitis: Secondary | ICD-10-CM | POA: Diagnosis not present

## 2022-07-24 DIAGNOSIS — E559 Vitamin D deficiency, unspecified: Secondary | ICD-10-CM | POA: Diagnosis not present

## 2022-07-24 DIAGNOSIS — L97211 Non-pressure chronic ulcer of right calf limited to breakdown of skin: Secondary | ICD-10-CM | POA: Diagnosis not present

## 2022-07-24 DIAGNOSIS — E569 Vitamin deficiency, unspecified: Secondary | ICD-10-CM | POA: Diagnosis not present

## 2022-07-24 DIAGNOSIS — R601 Generalized edema: Secondary | ICD-10-CM | POA: Diagnosis not present

## 2022-07-24 DIAGNOSIS — N200 Calculus of kidney: Secondary | ICD-10-CM | POA: Diagnosis not present

## 2022-07-27 DIAGNOSIS — I480 Paroxysmal atrial fibrillation: Secondary | ICD-10-CM | POA: Diagnosis not present

## 2022-07-27 DIAGNOSIS — I10 Essential (primary) hypertension: Secondary | ICD-10-CM | POA: Diagnosis not present

## 2022-07-27 DIAGNOSIS — M6281 Muscle weakness (generalized): Secondary | ICD-10-CM | POA: Diagnosis not present

## 2022-07-27 DIAGNOSIS — E039 Hypothyroidism, unspecified: Secondary | ICD-10-CM | POA: Diagnosis not present

## 2022-07-29 DIAGNOSIS — I480 Paroxysmal atrial fibrillation: Secondary | ICD-10-CM | POA: Diagnosis not present

## 2022-07-29 DIAGNOSIS — R21 Rash and other nonspecific skin eruption: Secondary | ICD-10-CM | POA: Diagnosis not present

## 2022-07-29 DIAGNOSIS — I10 Essential (primary) hypertension: Secondary | ICD-10-CM | POA: Diagnosis not present

## 2022-07-31 DIAGNOSIS — R41841 Cognitive communication deficit: Secondary | ICD-10-CM | POA: Diagnosis present

## 2022-07-31 DIAGNOSIS — E785 Hyperlipidemia, unspecified: Secondary | ICD-10-CM | POA: Diagnosis present

## 2022-07-31 DIAGNOSIS — K648 Other hemorrhoids: Secondary | ICD-10-CM | POA: Diagnosis present

## 2022-07-31 DIAGNOSIS — I87312 Chronic venous hypertension (idiopathic) with ulcer of left lower extremity: Secondary | ICD-10-CM | POA: Diagnosis not present

## 2022-07-31 DIAGNOSIS — L22 Diaper dermatitis: Secondary | ICD-10-CM | POA: Diagnosis not present

## 2022-07-31 DIAGNOSIS — L97211 Non-pressure chronic ulcer of right calf limited to breakdown of skin: Secondary | ICD-10-CM | POA: Diagnosis not present

## 2022-07-31 DIAGNOSIS — E559 Vitamin D deficiency, unspecified: Secondary | ICD-10-CM | POA: Diagnosis present

## 2022-07-31 DIAGNOSIS — N200 Calculus of kidney: Secondary | ICD-10-CM | POA: Diagnosis present

## 2022-07-31 DIAGNOSIS — I10 Essential (primary) hypertension: Secondary | ICD-10-CM | POA: Diagnosis present

## 2022-07-31 DIAGNOSIS — L97222 Non-pressure chronic ulcer of left calf with fat layer exposed: Secondary | ICD-10-CM | POA: Diagnosis not present

## 2022-07-31 DIAGNOSIS — N400 Enlarged prostate without lower urinary tract symptoms: Secondary | ICD-10-CM | POA: Diagnosis not present

## 2022-07-31 DIAGNOSIS — E612 Magnesium deficiency: Secondary | ICD-10-CM | POA: Diagnosis present

## 2022-07-31 DIAGNOSIS — H9042 Sensorineural hearing loss, unilateral, left ear, with unrestricted hearing on the contralateral side: Secondary | ICD-10-CM | POA: Diagnosis not present

## 2022-07-31 DIAGNOSIS — R601 Generalized edema: Secondary | ICD-10-CM | POA: Diagnosis present

## 2022-07-31 DIAGNOSIS — N132 Hydronephrosis with renal and ureteral calculous obstruction: Secondary | ICD-10-CM | POA: Diagnosis not present

## 2022-07-31 DIAGNOSIS — E669 Obesity, unspecified: Secondary | ICD-10-CM | POA: Diagnosis present

## 2022-07-31 DIAGNOSIS — I480 Paroxysmal atrial fibrillation: Secondary | ICD-10-CM | POA: Diagnosis not present

## 2022-07-31 DIAGNOSIS — J90 Pleural effusion, not elsewhere classified: Secondary | ICD-10-CM | POA: Diagnosis not present

## 2022-07-31 DIAGNOSIS — R262 Difficulty in walking, not elsewhere classified: Secondary | ICD-10-CM | POA: Diagnosis not present

## 2022-07-31 DIAGNOSIS — R5381 Other malaise: Secondary | ICD-10-CM | POA: Diagnosis present

## 2022-07-31 DIAGNOSIS — I87311 Chronic venous hypertension (idiopathic) with ulcer of right lower extremity: Secondary | ICD-10-CM | POA: Diagnosis not present

## 2022-07-31 DIAGNOSIS — E039 Hypothyroidism, unspecified: Secondary | ICD-10-CM | POA: Diagnosis present

## 2022-07-31 DIAGNOSIS — R319 Hematuria, unspecified: Secondary | ICD-10-CM | POA: Diagnosis not present

## 2022-07-31 DIAGNOSIS — M6281 Muscle weakness (generalized): Secondary | ICD-10-CM | POA: Diagnosis present

## 2022-07-31 DIAGNOSIS — H918X2 Other specified hearing loss, left ear: Secondary | ICD-10-CM | POA: Diagnosis present

## 2022-07-31 DIAGNOSIS — Z7901 Long term (current) use of anticoagulants: Secondary | ICD-10-CM | POA: Diagnosis not present

## 2022-07-31 DIAGNOSIS — K5730 Diverticulosis of large intestine without perforation or abscess without bleeding: Secondary | ICD-10-CM | POA: Diagnosis not present

## 2022-07-31 DIAGNOSIS — K219 Gastro-esophageal reflux disease without esophagitis: Secondary | ICD-10-CM | POA: Diagnosis not present

## 2022-07-31 DIAGNOSIS — R293 Abnormal posture: Secondary | ICD-10-CM | POA: Diagnosis present

## 2022-07-31 DIAGNOSIS — R278 Other lack of coordination: Secondary | ICD-10-CM | POA: Diagnosis present

## 2022-08-07 DIAGNOSIS — I10 Essential (primary) hypertension: Secondary | ICD-10-CM | POA: Diagnosis not present

## 2022-08-07 DIAGNOSIS — R601 Generalized edema: Secondary | ICD-10-CM | POA: Diagnosis not present

## 2022-08-07 DIAGNOSIS — R262 Difficulty in walking, not elsewhere classified: Secondary | ICD-10-CM | POA: Diagnosis not present

## 2022-08-07 DIAGNOSIS — E039 Hypothyroidism, unspecified: Secondary | ICD-10-CM | POA: Diagnosis not present

## 2022-08-07 DIAGNOSIS — Z7901 Long term (current) use of anticoagulants: Secondary | ICD-10-CM | POA: Diagnosis not present

## 2022-08-07 DIAGNOSIS — M6281 Muscle weakness (generalized): Secondary | ICD-10-CM | POA: Diagnosis not present

## 2022-08-07 DIAGNOSIS — E785 Hyperlipidemia, unspecified: Secondary | ICD-10-CM | POA: Diagnosis not present

## 2022-08-07 DIAGNOSIS — N400 Enlarged prostate without lower urinary tract symptoms: Secondary | ICD-10-CM | POA: Diagnosis not present

## 2022-08-07 DIAGNOSIS — H9042 Sensorineural hearing loss, unilateral, left ear, with unrestricted hearing on the contralateral side: Secondary | ICD-10-CM | POA: Diagnosis not present

## 2022-08-07 DIAGNOSIS — I480 Paroxysmal atrial fibrillation: Secondary | ICD-10-CM | POA: Diagnosis not present

## 2022-08-08 DIAGNOSIS — E559 Vitamin D deficiency, unspecified: Secondary | ICD-10-CM | POA: Diagnosis not present

## 2022-08-08 DIAGNOSIS — R601 Generalized edema: Secondary | ICD-10-CM | POA: Diagnosis not present

## 2022-08-08 DIAGNOSIS — E785 Hyperlipidemia, unspecified: Secondary | ICD-10-CM | POA: Diagnosis not present

## 2022-08-08 DIAGNOSIS — E039 Hypothyroidism, unspecified: Secondary | ICD-10-CM | POA: Diagnosis not present

## 2022-08-08 DIAGNOSIS — I480 Paroxysmal atrial fibrillation: Secondary | ICD-10-CM | POA: Diagnosis not present

## 2022-08-08 DIAGNOSIS — H9042 Sensorineural hearing loss, unilateral, left ear, with unrestricted hearing on the contralateral side: Secondary | ICD-10-CM | POA: Diagnosis not present

## 2022-08-08 DIAGNOSIS — M6281 Muscle weakness (generalized): Secondary | ICD-10-CM | POA: Diagnosis not present

## 2022-08-08 DIAGNOSIS — I10 Essential (primary) hypertension: Secondary | ICD-10-CM | POA: Diagnosis not present

## 2022-08-08 DIAGNOSIS — R262 Difficulty in walking, not elsewhere classified: Secondary | ICD-10-CM | POA: Diagnosis not present

## 2022-08-11 DIAGNOSIS — N200 Calculus of kidney: Secondary | ICD-10-CM | POA: Diagnosis not present

## 2022-08-11 DIAGNOSIS — R319 Hematuria, unspecified: Secondary | ICD-10-CM | POA: Diagnosis not present

## 2022-08-28 DIAGNOSIS — N132 Hydronephrosis with renal and ureteral calculous obstruction: Secondary | ICD-10-CM | POA: Diagnosis not present

## 2022-08-28 DIAGNOSIS — I10 Essential (primary) hypertension: Secondary | ICD-10-CM | POA: Diagnosis not present

## 2022-08-28 DIAGNOSIS — E785 Hyperlipidemia, unspecified: Secondary | ICD-10-CM | POA: Diagnosis not present

## 2022-08-28 DIAGNOSIS — N200 Calculus of kidney: Secondary | ICD-10-CM | POA: Diagnosis not present

## 2022-08-28 DIAGNOSIS — J90 Pleural effusion, not elsewhere classified: Secondary | ICD-10-CM | POA: Diagnosis not present

## 2022-08-28 DIAGNOSIS — I480 Paroxysmal atrial fibrillation: Secondary | ICD-10-CM | POA: Diagnosis not present

## 2022-08-31 DIAGNOSIS — K219 Gastro-esophageal reflux disease without esophagitis: Secondary | ICD-10-CM | POA: Diagnosis not present

## 2022-08-31 DIAGNOSIS — M6281 Muscle weakness (generalized): Secondary | ICD-10-CM | POA: Diagnosis not present

## 2022-08-31 DIAGNOSIS — E785 Hyperlipidemia, unspecified: Secondary | ICD-10-CM | POA: Diagnosis not present

## 2022-08-31 DIAGNOSIS — E039 Hypothyroidism, unspecified: Secondary | ICD-10-CM | POA: Diagnosis not present

## 2022-08-31 DIAGNOSIS — K5730 Diverticulosis of large intestine without perforation or abscess without bleeding: Secondary | ICD-10-CM | POA: Diagnosis not present

## 2022-08-31 DIAGNOSIS — H9042 Sensorineural hearing loss, unilateral, left ear, with unrestricted hearing on the contralateral side: Secondary | ICD-10-CM | POA: Diagnosis not present

## 2022-08-31 DIAGNOSIS — R601 Generalized edema: Secondary | ICD-10-CM | POA: Diagnosis not present

## 2022-08-31 DIAGNOSIS — Z7901 Long term (current) use of anticoagulants: Secondary | ICD-10-CM | POA: Diagnosis not present

## 2022-08-31 DIAGNOSIS — N400 Enlarged prostate without lower urinary tract symptoms: Secondary | ICD-10-CM | POA: Diagnosis not present

## 2022-08-31 DIAGNOSIS — R262 Difficulty in walking, not elsewhere classified: Secondary | ICD-10-CM | POA: Diagnosis not present

## 2022-08-31 DIAGNOSIS — I10 Essential (primary) hypertension: Secondary | ICD-10-CM | POA: Diagnosis not present

## 2022-08-31 DIAGNOSIS — I480 Paroxysmal atrial fibrillation: Secondary | ICD-10-CM | POA: Diagnosis not present

## 2022-09-02 DIAGNOSIS — R41841 Cognitive communication deficit: Secondary | ICD-10-CM | POA: Diagnosis not present

## 2022-09-02 DIAGNOSIS — M6281 Muscle weakness (generalized): Secondary | ICD-10-CM | POA: Diagnosis not present

## 2022-09-02 DIAGNOSIS — R293 Abnormal posture: Secondary | ICD-10-CM | POA: Diagnosis not present

## 2022-09-02 DIAGNOSIS — I480 Paroxysmal atrial fibrillation: Secondary | ICD-10-CM | POA: Diagnosis not present

## 2022-09-02 DIAGNOSIS — R278 Other lack of coordination: Secondary | ICD-10-CM | POA: Diagnosis not present

## 2022-09-03 DIAGNOSIS — I10 Essential (primary) hypertension: Secondary | ICD-10-CM | POA: Diagnosis not present

## 2022-09-03 DIAGNOSIS — E785 Hyperlipidemia, unspecified: Secondary | ICD-10-CM | POA: Diagnosis not present

## 2022-09-03 DIAGNOSIS — E039 Hypothyroidism, unspecified: Secondary | ICD-10-CM | POA: Diagnosis not present

## 2022-09-04 DIAGNOSIS — M6281 Muscle weakness (generalized): Secondary | ICD-10-CM | POA: Diagnosis not present

## 2022-09-04 DIAGNOSIS — I480 Paroxysmal atrial fibrillation: Secondary | ICD-10-CM | POA: Diagnosis not present

## 2022-09-04 DIAGNOSIS — R278 Other lack of coordination: Secondary | ICD-10-CM | POA: Diagnosis not present

## 2022-09-04 DIAGNOSIS — R41841 Cognitive communication deficit: Secondary | ICD-10-CM | POA: Diagnosis not present

## 2022-09-04 DIAGNOSIS — R293 Abnormal posture: Secondary | ICD-10-CM | POA: Diagnosis not present

## 2022-09-05 DIAGNOSIS — R278 Other lack of coordination: Secondary | ICD-10-CM | POA: Diagnosis not present

## 2022-09-05 DIAGNOSIS — I480 Paroxysmal atrial fibrillation: Secondary | ICD-10-CM | POA: Diagnosis not present

## 2022-09-05 DIAGNOSIS — R293 Abnormal posture: Secondary | ICD-10-CM | POA: Diagnosis not present

## 2022-09-05 DIAGNOSIS — R41841 Cognitive communication deficit: Secondary | ICD-10-CM | POA: Diagnosis not present

## 2022-09-05 DIAGNOSIS — M6281 Muscle weakness (generalized): Secondary | ICD-10-CM | POA: Diagnosis not present

## 2022-09-06 DIAGNOSIS — R41841 Cognitive communication deficit: Secondary | ICD-10-CM | POA: Diagnosis not present

## 2022-09-06 DIAGNOSIS — M6281 Muscle weakness (generalized): Secondary | ICD-10-CM | POA: Diagnosis not present

## 2022-09-06 DIAGNOSIS — I480 Paroxysmal atrial fibrillation: Secondary | ICD-10-CM | POA: Diagnosis not present

## 2022-09-06 DIAGNOSIS — R278 Other lack of coordination: Secondary | ICD-10-CM | POA: Diagnosis not present

## 2022-09-06 DIAGNOSIS — R293 Abnormal posture: Secondary | ICD-10-CM | POA: Diagnosis not present

## 2022-09-07 DIAGNOSIS — I480 Paroxysmal atrial fibrillation: Secondary | ICD-10-CM | POA: Diagnosis not present

## 2022-09-07 DIAGNOSIS — R278 Other lack of coordination: Secondary | ICD-10-CM | POA: Diagnosis not present

## 2022-09-07 DIAGNOSIS — R293 Abnormal posture: Secondary | ICD-10-CM | POA: Diagnosis not present

## 2022-09-07 DIAGNOSIS — R41841 Cognitive communication deficit: Secondary | ICD-10-CM | POA: Diagnosis not present

## 2022-09-07 DIAGNOSIS — N201 Calculus of ureter: Secondary | ICD-10-CM | POA: Diagnosis not present

## 2022-09-07 DIAGNOSIS — M6281 Muscle weakness (generalized): Secondary | ICD-10-CM | POA: Diagnosis not present

## 2022-09-07 DIAGNOSIS — N202 Calculus of kidney with calculus of ureter: Secondary | ICD-10-CM | POA: Diagnosis not present

## 2022-09-08 DIAGNOSIS — I480 Paroxysmal atrial fibrillation: Secondary | ICD-10-CM | POA: Diagnosis not present

## 2022-09-08 DIAGNOSIS — R278 Other lack of coordination: Secondary | ICD-10-CM | POA: Diagnosis not present

## 2022-09-08 DIAGNOSIS — R293 Abnormal posture: Secondary | ICD-10-CM | POA: Diagnosis not present

## 2022-09-08 DIAGNOSIS — R41841 Cognitive communication deficit: Secondary | ICD-10-CM | POA: Diagnosis not present

## 2022-09-08 DIAGNOSIS — M6281 Muscle weakness (generalized): Secondary | ICD-10-CM | POA: Diagnosis not present

## 2022-09-09 DIAGNOSIS — R293 Abnormal posture: Secondary | ICD-10-CM | POA: Diagnosis not present

## 2022-09-09 DIAGNOSIS — R278 Other lack of coordination: Secondary | ICD-10-CM | POA: Diagnosis not present

## 2022-09-09 DIAGNOSIS — M6281 Muscle weakness (generalized): Secondary | ICD-10-CM | POA: Diagnosis not present

## 2022-09-09 DIAGNOSIS — I480 Paroxysmal atrial fibrillation: Secondary | ICD-10-CM | POA: Diagnosis not present

## 2022-09-09 DIAGNOSIS — R41841 Cognitive communication deficit: Secondary | ICD-10-CM | POA: Diagnosis not present

## 2022-09-11 DIAGNOSIS — E785 Hyperlipidemia, unspecified: Secondary | ICD-10-CM | POA: Diagnosis not present

## 2022-09-11 DIAGNOSIS — M6281 Muscle weakness (generalized): Secondary | ICD-10-CM | POA: Diagnosis not present

## 2022-09-11 DIAGNOSIS — R293 Abnormal posture: Secondary | ICD-10-CM | POA: Diagnosis not present

## 2022-09-11 DIAGNOSIS — I480 Paroxysmal atrial fibrillation: Secondary | ICD-10-CM | POA: Diagnosis not present

## 2022-09-11 DIAGNOSIS — R278 Other lack of coordination: Secondary | ICD-10-CM | POA: Diagnosis not present

## 2022-09-11 DIAGNOSIS — I10 Essential (primary) hypertension: Secondary | ICD-10-CM | POA: Diagnosis not present

## 2022-09-11 DIAGNOSIS — R41841 Cognitive communication deficit: Secondary | ICD-10-CM | POA: Diagnosis not present

## 2022-09-12 DIAGNOSIS — R278 Other lack of coordination: Secondary | ICD-10-CM | POA: Diagnosis not present

## 2022-09-12 DIAGNOSIS — R293 Abnormal posture: Secondary | ICD-10-CM | POA: Diagnosis not present

## 2022-09-12 DIAGNOSIS — M6281 Muscle weakness (generalized): Secondary | ICD-10-CM | POA: Diagnosis not present

## 2022-09-12 DIAGNOSIS — I480 Paroxysmal atrial fibrillation: Secondary | ICD-10-CM | POA: Diagnosis not present

## 2022-09-12 DIAGNOSIS — R41841 Cognitive communication deficit: Secondary | ICD-10-CM | POA: Diagnosis not present

## 2022-09-13 DIAGNOSIS — I10 Essential (primary) hypertension: Secondary | ICD-10-CM | POA: Diagnosis not present

## 2022-09-13 DIAGNOSIS — R293 Abnormal posture: Secondary | ICD-10-CM | POA: Diagnosis not present

## 2022-09-13 DIAGNOSIS — R278 Other lack of coordination: Secondary | ICD-10-CM | POA: Diagnosis not present

## 2022-09-13 DIAGNOSIS — I480 Paroxysmal atrial fibrillation: Secondary | ICD-10-CM | POA: Diagnosis not present

## 2022-09-13 DIAGNOSIS — E785 Hyperlipidemia, unspecified: Secondary | ICD-10-CM | POA: Diagnosis not present

## 2022-09-13 DIAGNOSIS — M6281 Muscle weakness (generalized): Secondary | ICD-10-CM | POA: Diagnosis not present

## 2022-09-13 DIAGNOSIS — R41841 Cognitive communication deficit: Secondary | ICD-10-CM | POA: Diagnosis not present

## 2022-09-14 DIAGNOSIS — I480 Paroxysmal atrial fibrillation: Secondary | ICD-10-CM | POA: Diagnosis not present

## 2022-09-14 DIAGNOSIS — R278 Other lack of coordination: Secondary | ICD-10-CM | POA: Diagnosis not present

## 2022-09-14 DIAGNOSIS — R293 Abnormal posture: Secondary | ICD-10-CM | POA: Diagnosis not present

## 2022-09-14 DIAGNOSIS — R41841 Cognitive communication deficit: Secondary | ICD-10-CM | POA: Diagnosis not present

## 2022-09-14 DIAGNOSIS — M6281 Muscle weakness (generalized): Secondary | ICD-10-CM | POA: Diagnosis not present

## 2022-09-14 DIAGNOSIS — D649 Anemia, unspecified: Secondary | ICD-10-CM | POA: Diagnosis not present

## 2022-09-14 DIAGNOSIS — K5790 Diverticulosis of intestine, part unspecified, without perforation or abscess without bleeding: Secondary | ICD-10-CM | POA: Diagnosis not present

## 2022-09-14 DIAGNOSIS — K4090 Unilateral inguinal hernia, without obstruction or gangrene, not specified as recurrent: Secondary | ICD-10-CM | POA: Diagnosis not present

## 2022-09-15 DIAGNOSIS — R278 Other lack of coordination: Secondary | ICD-10-CM | POA: Diagnosis not present

## 2022-09-15 DIAGNOSIS — M6281 Muscle weakness (generalized): Secondary | ICD-10-CM | POA: Diagnosis not present

## 2022-09-15 DIAGNOSIS — R293 Abnormal posture: Secondary | ICD-10-CM | POA: Diagnosis not present

## 2022-09-15 DIAGNOSIS — R41841 Cognitive communication deficit: Secondary | ICD-10-CM | POA: Diagnosis not present

## 2022-09-15 DIAGNOSIS — I480 Paroxysmal atrial fibrillation: Secondary | ICD-10-CM | POA: Diagnosis not present

## 2022-09-18 DIAGNOSIS — R293 Abnormal posture: Secondary | ICD-10-CM | POA: Diagnosis not present

## 2022-09-18 DIAGNOSIS — M6281 Muscle weakness (generalized): Secondary | ICD-10-CM | POA: Diagnosis not present

## 2022-09-18 DIAGNOSIS — I480 Paroxysmal atrial fibrillation: Secondary | ICD-10-CM | POA: Diagnosis not present

## 2022-09-18 DIAGNOSIS — R41841 Cognitive communication deficit: Secondary | ICD-10-CM | POA: Diagnosis not present

## 2022-09-18 DIAGNOSIS — R278 Other lack of coordination: Secondary | ICD-10-CM | POA: Diagnosis not present

## 2022-09-19 DIAGNOSIS — I480 Paroxysmal atrial fibrillation: Secondary | ICD-10-CM | POA: Diagnosis not present

## 2022-09-19 DIAGNOSIS — R278 Other lack of coordination: Secondary | ICD-10-CM | POA: Diagnosis not present

## 2022-09-19 DIAGNOSIS — R293 Abnormal posture: Secondary | ICD-10-CM | POA: Diagnosis not present

## 2022-09-19 DIAGNOSIS — M6281 Muscle weakness (generalized): Secondary | ICD-10-CM | POA: Diagnosis not present

## 2022-09-19 DIAGNOSIS — R41841 Cognitive communication deficit: Secondary | ICD-10-CM | POA: Diagnosis not present

## 2022-09-20 DIAGNOSIS — R41841 Cognitive communication deficit: Secondary | ICD-10-CM | POA: Diagnosis not present

## 2022-09-20 DIAGNOSIS — R278 Other lack of coordination: Secondary | ICD-10-CM | POA: Diagnosis not present

## 2022-09-20 DIAGNOSIS — I480 Paroxysmal atrial fibrillation: Secondary | ICD-10-CM | POA: Diagnosis not present

## 2022-09-20 DIAGNOSIS — R293 Abnormal posture: Secondary | ICD-10-CM | POA: Diagnosis not present

## 2022-09-20 DIAGNOSIS — M6281 Muscle weakness (generalized): Secondary | ICD-10-CM | POA: Diagnosis not present

## 2022-09-21 DIAGNOSIS — M6281 Muscle weakness (generalized): Secondary | ICD-10-CM | POA: Diagnosis not present

## 2022-09-21 DIAGNOSIS — I480 Paroxysmal atrial fibrillation: Secondary | ICD-10-CM | POA: Diagnosis not present

## 2022-09-21 DIAGNOSIS — R41841 Cognitive communication deficit: Secondary | ICD-10-CM | POA: Diagnosis not present

## 2022-09-21 DIAGNOSIS — R293 Abnormal posture: Secondary | ICD-10-CM | POA: Diagnosis not present

## 2022-09-21 DIAGNOSIS — R278 Other lack of coordination: Secondary | ICD-10-CM | POA: Diagnosis not present

## 2022-09-22 DIAGNOSIS — R293 Abnormal posture: Secondary | ICD-10-CM | POA: Diagnosis not present

## 2022-09-22 DIAGNOSIS — R41841 Cognitive communication deficit: Secondary | ICD-10-CM | POA: Diagnosis not present

## 2022-09-22 DIAGNOSIS — I480 Paroxysmal atrial fibrillation: Secondary | ICD-10-CM | POA: Diagnosis not present

## 2022-09-22 DIAGNOSIS — M6281 Muscle weakness (generalized): Secondary | ICD-10-CM | POA: Diagnosis not present

## 2022-09-22 DIAGNOSIS — R278 Other lack of coordination: Secondary | ICD-10-CM | POA: Diagnosis not present

## 2022-09-25 DIAGNOSIS — M6281 Muscle weakness (generalized): Secondary | ICD-10-CM | POA: Diagnosis not present

## 2022-09-25 DIAGNOSIS — R41841 Cognitive communication deficit: Secondary | ICD-10-CM | POA: Diagnosis not present

## 2022-09-25 DIAGNOSIS — I480 Paroxysmal atrial fibrillation: Secondary | ICD-10-CM | POA: Diagnosis not present

## 2022-09-25 DIAGNOSIS — R293 Abnormal posture: Secondary | ICD-10-CM | POA: Diagnosis not present

## 2022-09-25 DIAGNOSIS — R278 Other lack of coordination: Secondary | ICD-10-CM | POA: Diagnosis not present

## 2022-09-26 DIAGNOSIS — M1711 Unilateral primary osteoarthritis, right knee: Secondary | ICD-10-CM | POA: Diagnosis not present

## 2022-09-26 DIAGNOSIS — R293 Abnormal posture: Secondary | ICD-10-CM | POA: Diagnosis not present

## 2022-09-26 DIAGNOSIS — M1712 Unilateral primary osteoarthritis, left knee: Secondary | ICD-10-CM | POA: Diagnosis not present

## 2022-09-26 DIAGNOSIS — R278 Other lack of coordination: Secondary | ICD-10-CM | POA: Diagnosis not present

## 2022-09-26 DIAGNOSIS — R41841 Cognitive communication deficit: Secondary | ICD-10-CM | POA: Diagnosis not present

## 2022-09-26 DIAGNOSIS — M6281 Muscle weakness (generalized): Secondary | ICD-10-CM | POA: Diagnosis not present

## 2022-09-26 DIAGNOSIS — I480 Paroxysmal atrial fibrillation: Secondary | ICD-10-CM | POA: Diagnosis not present

## 2022-09-27 DIAGNOSIS — I480 Paroxysmal atrial fibrillation: Secondary | ICD-10-CM | POA: Diagnosis not present

## 2022-09-27 DIAGNOSIS — M6281 Muscle weakness (generalized): Secondary | ICD-10-CM | POA: Diagnosis not present

## 2022-09-27 DIAGNOSIS — R41841 Cognitive communication deficit: Secondary | ICD-10-CM | POA: Diagnosis not present

## 2022-09-27 DIAGNOSIS — R293 Abnormal posture: Secondary | ICD-10-CM | POA: Diagnosis not present

## 2022-09-27 DIAGNOSIS — R278 Other lack of coordination: Secondary | ICD-10-CM | POA: Diagnosis not present

## 2022-09-28 DIAGNOSIS — R293 Abnormal posture: Secondary | ICD-10-CM | POA: Diagnosis not present

## 2022-09-28 DIAGNOSIS — I480 Paroxysmal atrial fibrillation: Secondary | ICD-10-CM | POA: Diagnosis not present

## 2022-09-28 DIAGNOSIS — R278 Other lack of coordination: Secondary | ICD-10-CM | POA: Diagnosis not present

## 2022-09-28 DIAGNOSIS — R41841 Cognitive communication deficit: Secondary | ICD-10-CM | POA: Diagnosis not present

## 2022-09-28 DIAGNOSIS — M6281 Muscle weakness (generalized): Secondary | ICD-10-CM | POA: Diagnosis not present

## 2022-09-29 DIAGNOSIS — M6281 Muscle weakness (generalized): Secondary | ICD-10-CM | POA: Diagnosis not present

## 2022-09-29 DIAGNOSIS — R278 Other lack of coordination: Secondary | ICD-10-CM | POA: Diagnosis not present

## 2022-09-29 DIAGNOSIS — R41841 Cognitive communication deficit: Secondary | ICD-10-CM | POA: Diagnosis not present

## 2022-09-29 DIAGNOSIS — R293 Abnormal posture: Secondary | ICD-10-CM | POA: Diagnosis not present

## 2022-09-29 DIAGNOSIS — I480 Paroxysmal atrial fibrillation: Secondary | ICD-10-CM | POA: Diagnosis not present

## 2022-10-09 DIAGNOSIS — N200 Calculus of kidney: Secondary | ICD-10-CM | POA: Diagnosis not present

## 2022-10-10 ENCOUNTER — Telehealth: Payer: Self-pay | Admitting: *Deleted

## 2022-10-10 NOTE — Telephone Encounter (Signed)
Pharmacy please advise on holding Eliquis prior to Bilateral Ureteroscopic Stone Extraction  scheduled for 11/02/2022. Thank you.

## 2022-10-10 NOTE — Telephone Encounter (Signed)
Dr. Elberta Fortis, patient's chart was reviewed for preoperative cardiac evaluation.  He  was seen by Rudi Coco on 09/02/2022 as post hospital follow up for new onset Afib.  Her note states:  1. New onset  afib Was rate controlled and asymptomatic   Found during recent hospitalization for bilateral cellulitis  Now appears to be in a junctional rhythm One week Zio patch placed Not on rate control   Result of Zio 09/20/2022 Patch Wear Time:  6 days and 18 hours    100% atrial fibrillation burden 1.4% ventricular ectopy No triggered episodes recorded   2. CHA2DS2VASc score of at least 5 Continue on eliquis 5 mg bid ASA was reduced to 81 mg daily   Would you comment on whether he can move forward with  Bilateral Ureteroscopic Stone Extraction which is scheduled for 11/02/2022.  Pharmacy is reviewing Eliquis.    Please route your response to p cv div preop.  Thank you, Marcelino Duster

## 2022-10-10 NOTE — Telephone Encounter (Signed)
   Pre-operative Risk Assessment    Patient Name: Casey Parker  DOB: April 21, 1945 MRN: [id number]      Request for Surgical Clearance    Procedure:   Bilateral Ureteroscopic Stone Extraction  Date of Surgery:  Clearance 11/02/22                                 Surgeon:  Dr. Lottie Mussel Surgeon's Group or Practice Name:  [facility] Phone number:  [phone] Fax number:  [phone]   Type of Clearance Requested:   - Medical  - Pharmacy:  Hold Aspirin and Apixaban (Eliquis) Not Indicated.   Type of Anesthesia:  Not Indicated   Additional requests/questions:    Signed, Emmit Pomfret   10/10/2022, 12:30 PM

## 2022-10-10 NOTE — Telephone Encounter (Signed)
   Name: Casey Parker  DOB: 05-22-1945  MRN: [id number]  Primary Cardiologist: None  Chart reviewed as part of pre-operative protocol coverage. Because of Casey Parker's past medical history and time since last visit, he will require a follow-up in-office visit in order to better assess preoperative cardiovascular risk.Last seen in 08/2021  Pre-op covering staff: - Please schedule appointment and call patient to inform them. If patient already had an upcoming appointment within acceptable timeframe, please add "pre-op clearance" to the appointment notes so provider is aware. - Please contact requesting surgeon's office via preferred method (i.e, phone, fax) to inform them of need for appointment prior to surgery.  This message will also be routed to pharmacy pool and/or Dr Elberta Fortis for input on holding Elquis as requested below so that this information is available to the clearing provider at time of patient's appointment.   Joni Reining, NP  10/10/2022, 4:48 PM

## 2022-10-11 ENCOUNTER — Telehealth: Payer: Self-pay

## 2022-10-11 NOTE — Telephone Encounter (Signed)
I have sent a message to our scheduling team to assist in appt.

## 2022-10-11 NOTE — Telephone Encounter (Signed)
Patient's son was calling because the patient is needing to come in office for clearance. Patient is living in a facility in [place] and does not have a way of bringing the patient in office. Patient's son would like to know what options he has for getting the clearance. Please advise.

## 2022-10-11 NOTE — Telephone Encounter (Signed)
Patient may see any general cardiology provider.  Thank you for your help.  I will remove patient from preoperative pool and lieu of an office appointment.  Please add preoperative cardiac evaluation to appointment note.  Thank you.  Thomasene Ripple. Obe Ahlers NP-C     10/11/2022, 2:25 PM [facility] [address] Office [phone] Fax [phone]

## 2022-10-11 NOTE — Telephone Encounter (Signed)
Per pre op APP Edd Fabian, FNP pt to see gen card or EP who has first availability.

## 2022-10-11 NOTE — Telephone Encounter (Signed)
Edd Fabian M, NP22 minutes ago (2:25 PM)    Patient may see any general cardiology provider.  Thank you for your help.  I will remove patient from preoperative pool and lieu of an office appointment.  Please add preoperative cardiac evaluation to appointment note.  Thank you.   Thomasene Ripple. Cleaver NP-C      10/11/2022, 2:25 PM [facility] [address] Office [phone] Fax [phone]        Note   You routed conversation to Cv Div Preop24 minutes ago (2:22 PM)   You24 minutes ago (2:22 PM)    I will forward this to the pre op APP for further advise and recommendations.       Note   Tawni Millers routed conversation to Cv Div Preop Callback35 minutes ago (2:12 PM)   Earl Lagos K35 minutes ago (2:12 PM)   AF Patient's son was calling because the patient is needing to come in office for clearance. Patient is living in a facility in [place] and does not have a way of bringing the patient in office. Patient's son would like to know what options he has for getting the clearance. Please advise.      Note   Tench,Philip [phone]  Tawni Millers

## 2022-10-11 NOTE — Telephone Encounter (Signed)
Patient's only visit with [facility] was to afib clinic once a year ago. Requires updated office visit before clearance can be provided.

## 2022-10-11 NOTE — Telephone Encounter (Signed)
I will forward this to the pre op APP for further advise and recommendations.

## 2022-10-12 NOTE — Telephone Encounter (Signed)
Returned call back to pt's son.  He has been made aware that the pt can see any General Cards.  He was advised to have the surgeon's office that is doing the procedure put in a referral for him.  He did advise that he already had that in the works, but was very thankful for the communication today.

## 2022-10-12 NOTE — Telephone Encounter (Signed)
Patients son is returning phone call

## 2022-10-12 NOTE — Telephone Encounter (Signed)
Returned call to pt's son, Mellody Dance.  Left a message for him to call back and ask for the preop team.

## 2022-10-13 NOTE — Telephone Encounter (Signed)
Returned call back to pt's son.  He has been made aware that the pt can see any General Cards.  He was advised to have the surgeon's office that is doing the procedure put in a referral for him.  He did advise that he already had that in the works, but was very thankful for the communication today.

## 2023-12-31 DEATH — deceased

## 2024-01-24 IMAGING — US US PELVIS LIMITED
1 series · 6 of 6 positions shown · non-contrast
Comparison: CT abdomen/pelvis 11/07/2019.

CLINICAL DATA: Urinary frequency, dysuria

EXAM:
US BLADDER
TECHNIQUE: Grayscale ultrasound of the bladder was obtained.

[Series 1: us pelvis limited · 0.26mm/px · 6 of 6 slices shown]
[im 1/6]
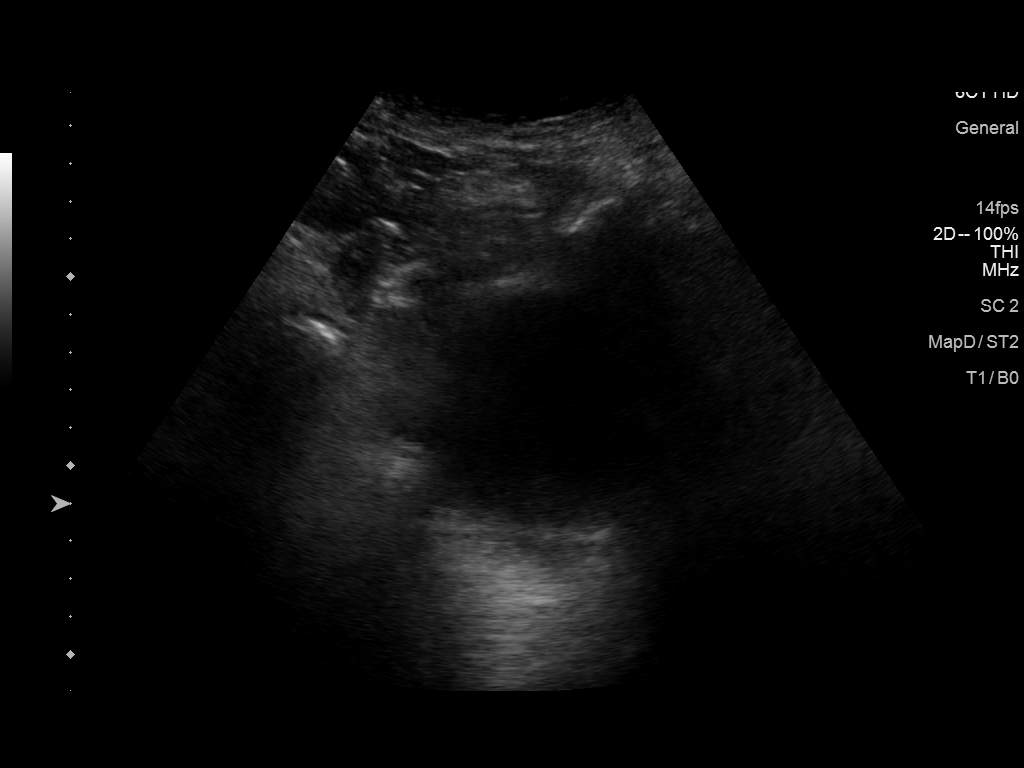
[im 2/6]
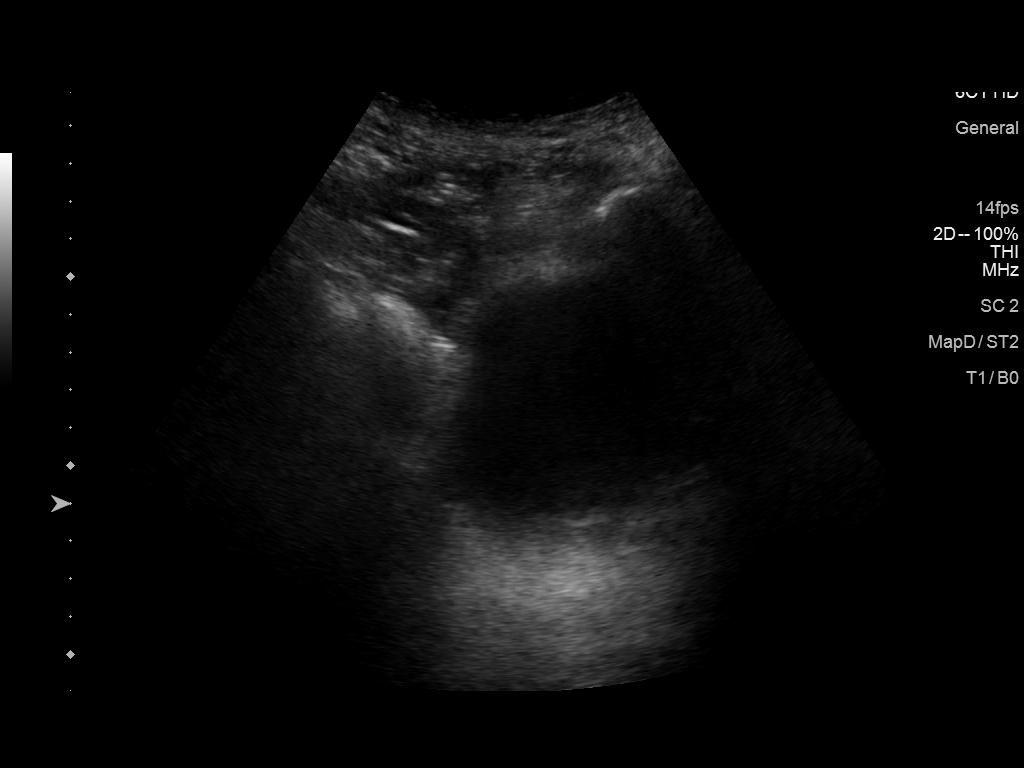
[im 3/6]
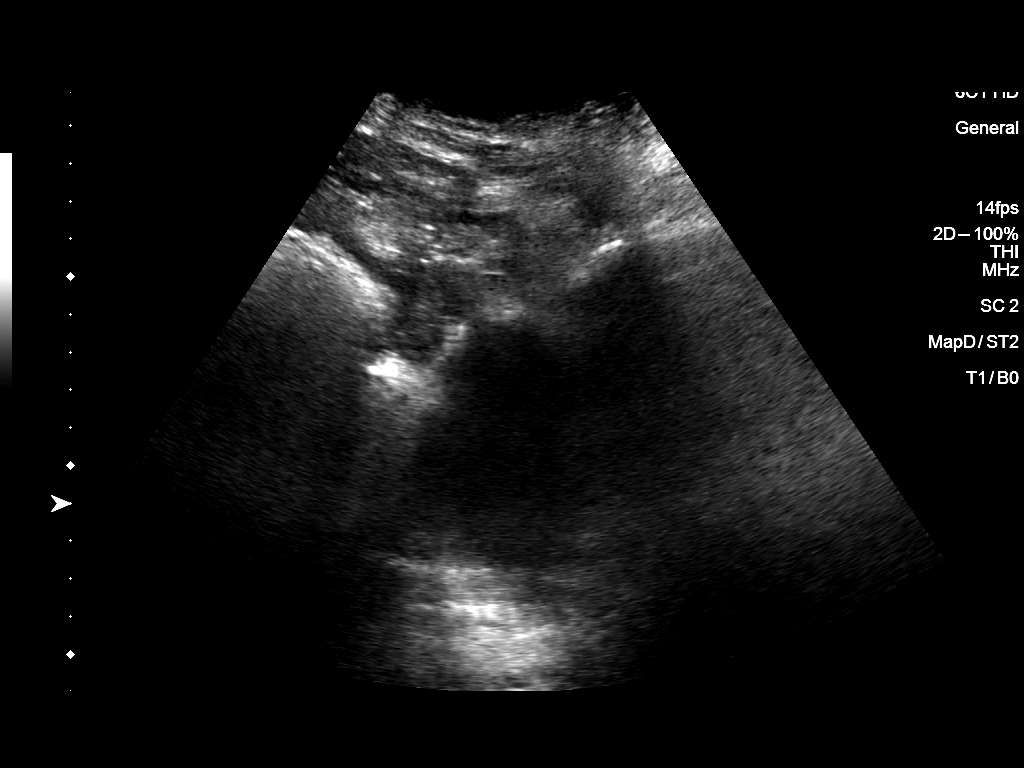
[im 4/6]
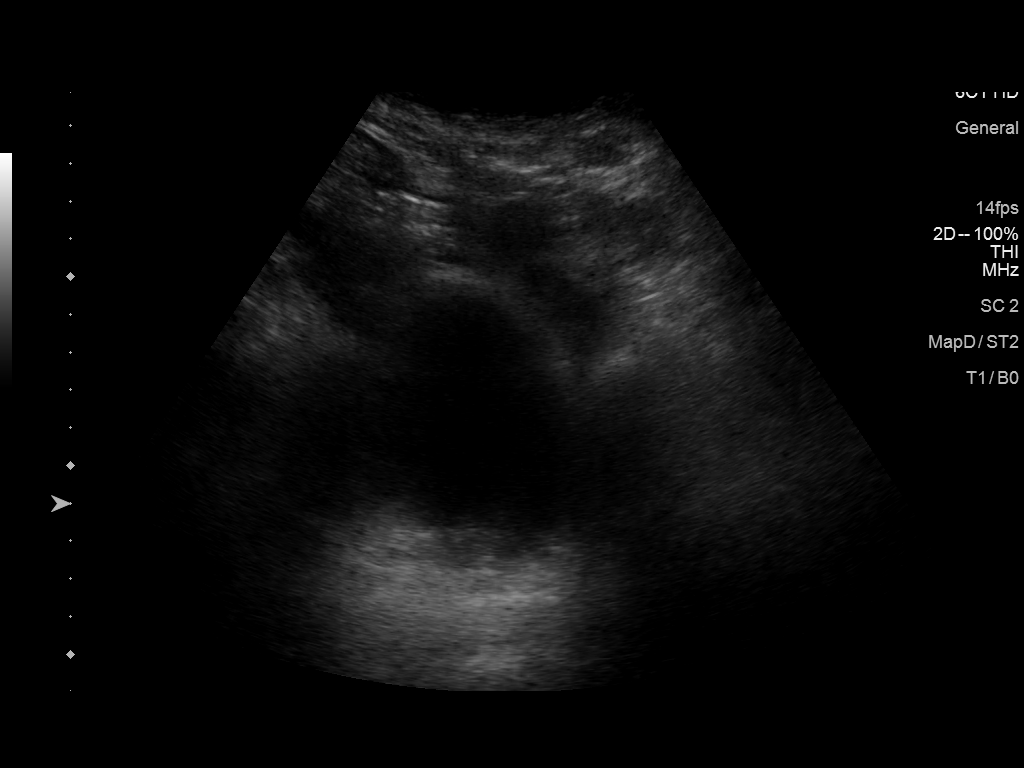
[im 5/6]
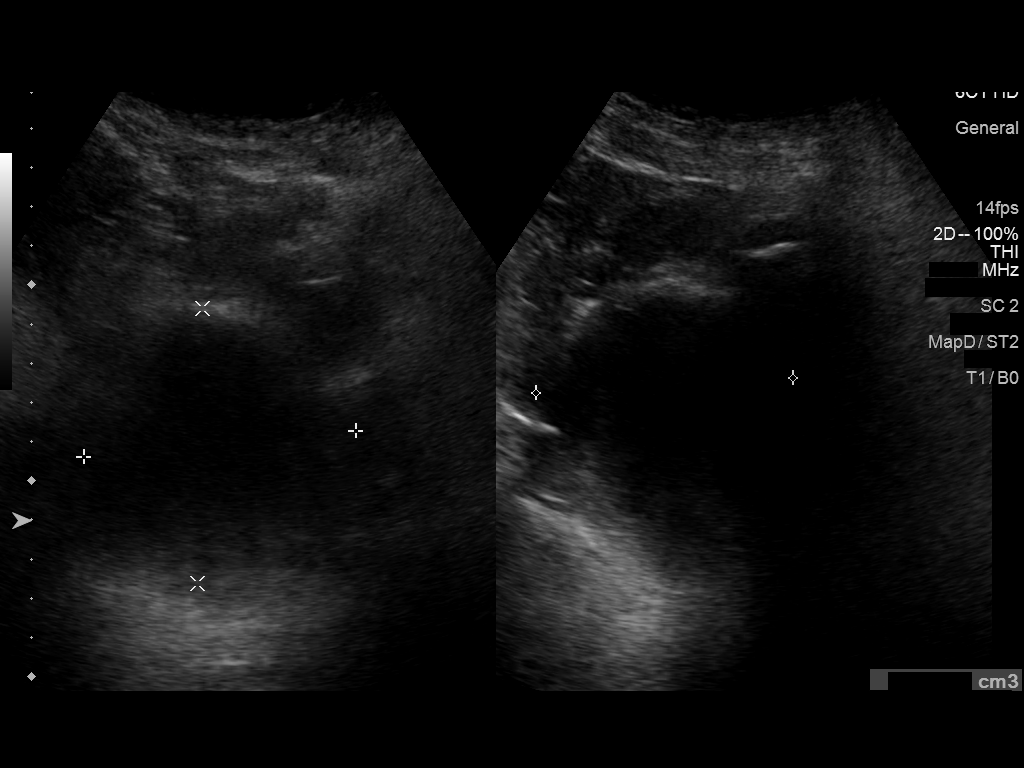
[im 6/6]
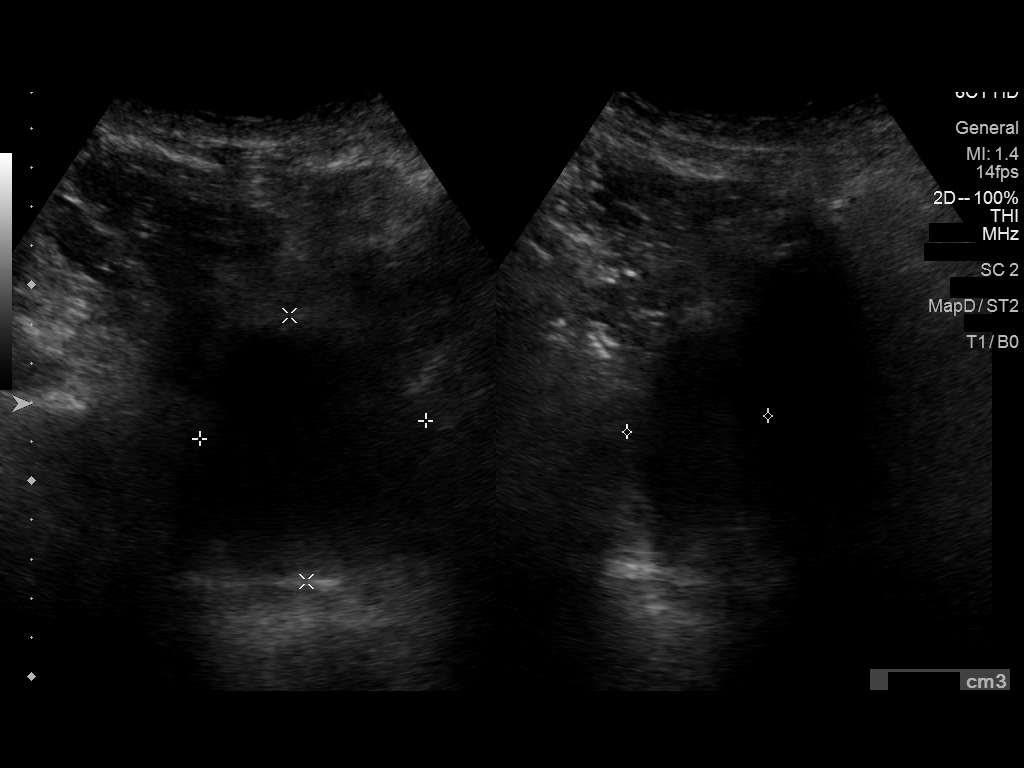

[6 of 6 positions shown; findings below may reference images not displayed]

FINDINGS: Ultrasound of the bladder after voiding was obtained common per
ordering provider. Prevoid volume is calculated at 169 cc. Postvoid
residual measures 75 cc.
IMPRESSION: Postvoid residual of 75 cc.
